# Patient Record
Sex: Female | Born: 1940 | Race: White | Hispanic: No | Marital: Married | Smoking: Never smoker
Health system: Southern US, Community
[De-identification: ages and names within clinical notes are randomized; demographics above are authoritative.]

## PROBLEM LIST (undated history)

## (undated) DIAGNOSIS — Z8742 Personal history of other diseases of the female genital tract: Secondary | ICD-10-CM

## (undated) DIAGNOSIS — R112 Nausea with vomiting, unspecified: Secondary | ICD-10-CM

## (undated) DIAGNOSIS — J45909 Unspecified asthma, uncomplicated: Secondary | ICD-10-CM

## (undated) DIAGNOSIS — I10 Essential (primary) hypertension: Secondary | ICD-10-CM

## (undated) DIAGNOSIS — E288 Other ovarian dysfunction: Secondary | ICD-10-CM

## (undated) DIAGNOSIS — E2839 Other primary ovarian failure: Secondary | ICD-10-CM

## (undated) DIAGNOSIS — K529 Noninfective gastroenteritis and colitis, unspecified: Secondary | ICD-10-CM

## (undated) DIAGNOSIS — S32019A Unspecified fracture of first lumbar vertebra, initial encounter for closed fracture: Secondary | ICD-10-CM

## (undated) DIAGNOSIS — M810 Age-related osteoporosis without current pathological fracture: Secondary | ICD-10-CM

## (undated) DIAGNOSIS — E7800 Pure hypercholesterolemia, unspecified: Secondary | ICD-10-CM

## (undated) DIAGNOSIS — N952 Postmenopausal atrophic vaginitis: Secondary | ICD-10-CM

## (undated) DIAGNOSIS — Z9889 Other specified postprocedural states: Secondary | ICD-10-CM

## (undated) DIAGNOSIS — E2740 Unspecified adrenocortical insufficiency: Secondary | ICD-10-CM

## (undated) DIAGNOSIS — D649 Anemia, unspecified: Secondary | ICD-10-CM

## (undated) HISTORY — DX: Age-related osteoporosis without current pathological fracture: M81.0

## (undated) HISTORY — DX: Anemia, unspecified: D64.9

## (undated) HISTORY — DX: Other primary ovarian failure: E28.39

## (undated) HISTORY — DX: Pure hypercholesterolemia, unspecified: E78.00

## (undated) HISTORY — DX: Unspecified fracture of first lumbar vertebra, initial encounter for closed fracture: S32.019A

## (undated) HISTORY — DX: Noninfective gastroenteritis and colitis, unspecified: K52.9

## (undated) HISTORY — DX: Unspecified asthma, uncomplicated: J45.909

## (undated) HISTORY — DX: Postmenopausal atrophic vaginitis: N95.2

## (undated) HISTORY — DX: Other ovarian dysfunction: E28.8

## (undated) HISTORY — DX: Personal history of other diseases of the female genital tract: Z87.42

## (undated) HISTORY — PX: NASAL SINUS SURGERY: SHX719

---

## 1980-05-07 HISTORY — PX: TUBAL LIGATION: SHX77

## 1994-05-07 HISTORY — PX: HYSTEROSCOPY: SHX211

## 1997-11-25 ENCOUNTER — Ambulatory Visit (HOSPITAL_COMMUNITY): Admission: RE | Admit: 1997-11-25 | Discharge: 1997-11-25 | Payer: Self-pay | Admitting: Obstetrics and Gynecology

## 1998-11-30 ENCOUNTER — Encounter: Payer: Self-pay | Admitting: Obstetrics and Gynecology

## 1998-11-30 ENCOUNTER — Ambulatory Visit (HOSPITAL_COMMUNITY): Admission: RE | Admit: 1998-11-30 | Discharge: 1998-11-30 | Payer: Self-pay | Admitting: Obstetrics and Gynecology

## 1999-04-06 ENCOUNTER — Other Ambulatory Visit: Admission: RE | Admit: 1999-04-06 | Discharge: 1999-04-06 | Payer: Self-pay | Admitting: Obstetrics and Gynecology

## 1999-12-06 ENCOUNTER — Ambulatory Visit (HOSPITAL_COMMUNITY): Admission: RE | Admit: 1999-12-06 | Discharge: 1999-12-06 | Payer: Self-pay | Admitting: Obstetrics and Gynecology

## 1999-12-06 ENCOUNTER — Encounter: Payer: Self-pay | Admitting: Obstetrics and Gynecology

## 2000-02-29 ENCOUNTER — Other Ambulatory Visit: Admission: RE | Admit: 2000-02-29 | Discharge: 2000-02-29 | Payer: Self-pay | Admitting: Obstetrics and Gynecology

## 2000-11-27 ENCOUNTER — Encounter: Payer: Self-pay | Admitting: Obstetrics and Gynecology

## 2000-11-27 ENCOUNTER — Encounter: Admission: RE | Admit: 2000-11-27 | Discharge: 2000-11-27 | Payer: Self-pay | Admitting: Obstetrics and Gynecology

## 2000-12-10 ENCOUNTER — Encounter: Payer: Self-pay | Admitting: Obstetrics and Gynecology

## 2000-12-10 ENCOUNTER — Ambulatory Visit (HOSPITAL_COMMUNITY): Admission: RE | Admit: 2000-12-10 | Discharge: 2000-12-10 | Payer: Self-pay | Admitting: Obstetrics and Gynecology

## 2001-03-28 ENCOUNTER — Other Ambulatory Visit: Admission: RE | Admit: 2001-03-28 | Discharge: 2001-03-28 | Payer: Self-pay | Admitting: Obstetrics and Gynecology

## 2001-12-31 ENCOUNTER — Encounter: Payer: Self-pay | Admitting: Obstetrics and Gynecology

## 2001-12-31 ENCOUNTER — Ambulatory Visit (HOSPITAL_COMMUNITY): Admission: RE | Admit: 2001-12-31 | Discharge: 2001-12-31 | Payer: Self-pay | Admitting: Obstetrics and Gynecology

## 2002-03-11 ENCOUNTER — Other Ambulatory Visit: Admission: RE | Admit: 2002-03-11 | Discharge: 2002-03-11 | Payer: Self-pay | Admitting: Obstetrics and Gynecology

## 2002-08-26 ENCOUNTER — Ambulatory Visit (HOSPITAL_COMMUNITY): Admission: RE | Admit: 2002-08-26 | Discharge: 2002-08-26 | Payer: Self-pay | Admitting: Gastroenterology

## 2003-02-10 ENCOUNTER — Ambulatory Visit (HOSPITAL_COMMUNITY): Admission: RE | Admit: 2003-02-10 | Discharge: 2003-02-10 | Payer: Self-pay | Admitting: Obstetrics and Gynecology

## 2003-02-10 ENCOUNTER — Encounter: Payer: Self-pay | Admitting: Obstetrics and Gynecology

## 2003-03-23 ENCOUNTER — Other Ambulatory Visit: Admission: RE | Admit: 2003-03-23 | Discharge: 2003-03-23 | Payer: Self-pay | Admitting: Obstetrics and Gynecology

## 2003-06-18 ENCOUNTER — Encounter: Admission: RE | Admit: 2003-06-18 | Discharge: 2003-06-18 | Payer: Self-pay | Admitting: Obstetrics and Gynecology

## 2004-04-11 ENCOUNTER — Other Ambulatory Visit: Admission: RE | Admit: 2004-04-11 | Discharge: 2004-04-11 | Payer: Self-pay | Admitting: Obstetrics and Gynecology

## 2004-05-17 ENCOUNTER — Ambulatory Visit (HOSPITAL_COMMUNITY): Admission: RE | Admit: 2004-05-17 | Discharge: 2004-05-17 | Payer: Self-pay | Admitting: Allergy

## 2004-05-24 ENCOUNTER — Ambulatory Visit (HOSPITAL_COMMUNITY): Admission: RE | Admit: 2004-05-24 | Discharge: 2004-05-24 | Payer: Self-pay | Admitting: Obstetrics and Gynecology

## 2005-06-08 ENCOUNTER — Other Ambulatory Visit: Admission: RE | Admit: 2005-06-08 | Discharge: 2005-06-08 | Payer: Self-pay | Admitting: Obstetrics & Gynecology

## 2005-07-06 ENCOUNTER — Ambulatory Visit (HOSPITAL_COMMUNITY): Admission: RE | Admit: 2005-07-06 | Discharge: 2005-07-06 | Payer: Self-pay | Admitting: Obstetrics & Gynecology

## 2005-07-27 ENCOUNTER — Ambulatory Visit (HOSPITAL_COMMUNITY): Admission: RE | Admit: 2005-07-27 | Discharge: 2005-07-27 | Payer: Self-pay | Admitting: Obstetrics & Gynecology

## 2006-01-13 ENCOUNTER — Emergency Department (HOSPITAL_COMMUNITY): Admission: EM | Admit: 2006-01-13 | Discharge: 2006-01-13 | Payer: Self-pay | Admitting: Emergency Medicine

## 2006-07-08 ENCOUNTER — Other Ambulatory Visit: Admission: RE | Admit: 2006-07-08 | Discharge: 2006-07-08 | Payer: Self-pay | Admitting: Obstetrics & Gynecology

## 2006-08-22 ENCOUNTER — Ambulatory Visit (HOSPITAL_COMMUNITY): Admission: RE | Admit: 2006-08-22 | Discharge: 2006-08-22 | Payer: Self-pay | Admitting: Obstetrics & Gynecology

## 2007-07-10 ENCOUNTER — Other Ambulatory Visit: Admission: RE | Admit: 2007-07-10 | Discharge: 2007-07-10 | Payer: Self-pay | Admitting: Obstetrics and Gynecology

## 2007-09-02 ENCOUNTER — Ambulatory Visit (HOSPITAL_COMMUNITY): Admission: RE | Admit: 2007-09-02 | Discharge: 2007-09-02 | Payer: Self-pay | Admitting: Obstetrics & Gynecology

## 2008-09-09 ENCOUNTER — Ambulatory Visit (HOSPITAL_COMMUNITY): Admission: RE | Admit: 2008-09-09 | Discharge: 2008-09-09 | Payer: Self-pay | Admitting: Obstetrics & Gynecology

## 2009-09-15 ENCOUNTER — Ambulatory Visit (HOSPITAL_COMMUNITY): Admission: RE | Admit: 2009-09-15 | Discharge: 2009-09-15 | Payer: Self-pay | Admitting: Obstetrics and Gynecology

## 2010-08-30 ENCOUNTER — Other Ambulatory Visit: Payer: Self-pay | Admitting: Nurse Practitioner

## 2010-08-30 DIAGNOSIS — Z1231 Encounter for screening mammogram for malignant neoplasm of breast: Secondary | ICD-10-CM

## 2010-08-30 DIAGNOSIS — Q782 Osteopetrosis: Secondary | ICD-10-CM

## 2010-09-19 ENCOUNTER — Ambulatory Visit (HOSPITAL_COMMUNITY)
Admission: RE | Admit: 2010-09-19 | Discharge: 2010-09-19 | Disposition: A | Discharge status: Home / Self Care | Payer: Medicare Other | Source: Ambulatory Visit | Attending: Nurse Practitioner | Admitting: Nurse Practitioner

## 2010-09-19 DIAGNOSIS — Z1231 Encounter for screening mammogram for malignant neoplasm of breast: Secondary | ICD-10-CM

## 2010-09-19 DIAGNOSIS — Q782 Osteopetrosis: Secondary | ICD-10-CM

## 2010-09-22 NOTE — Op Note (Signed)
NAME:  Megan Logan, Megan Logan                         ACCOUNT NO.:  [id number]   MEDICAL RECORD NO.:  [id number]                   PATIENT TYPE:  AMB   LOCATION:  ENDO                                 FACILITY:  [facility]   PHYSICIAN:  Anselmo Rod, M.D.               DATE OF BIRTH:  10/08/1940   DATE OF PROCEDURE:  08/26/2002  DATE OF DISCHARGE:                                 OPERATIVE REPORT   PROCEDURE PERFORMED:  Colonoscopy.   ENDOSCOPIST:  Charna Elizabeth, M.D.   INSTRUMENT USED:  Adjustable pediatric Olympus video colonoscope.   INDICATIONS FOR PROCEDURE:  The patient is a 70 year old white female  undergoing screening colonoscopy.  The patient has a family history of colon  cancer in a maternal aunt and was found to have guaiac positive stool in the  office.   PREPROCEDURE PREPARATION:  Informed consent was procured from the patient.  The patient was fasted for eight hours prior to the procedure and prepped  with a bottle of magnesium citrate and a gallon of GoLYTELY the night prior  to the procedure.   PREPROCEDURE PHYSICAL:  The patient had stable vital signs.  Neck supple.  Chest clear to auscultation.  S1 and S2 regular.  Abdomen soft with normal  bowel sounds.   DESCRIPTION OF PROCEDURE:  The patient was placed in left lateral decubitus  position and sedated with 50 mg of Demerol and 5 mg of Versed intravenously.  Once the patient was adequately sedated and maintained on low flow oxygen  and continuous cardiac monitoring, the Olympus video colonoscope was  advanced from the rectum to the cecum and terminal ileum without difficulty.  The appendicular orifice and ileocecal valve were clearly visualized and  photographed.  No masses, polyps, erosions, ulcerations or diverticula were  seen.  There was no evidence of arteriovenous malformations.  Small  nonbleeding internal hemorrhoids were seen on retroflexion in the rectum.   IMPRESSION:  Normal colonoscopy except for small  internal hemorrhoids.   RECOMMENDATIONS:  1. Outpatient follow-up is advised in the next two weeks for repeat guaiac     testing.  2. Repeat colorectal cancer screening recommended in the next five years     unless the patient develops any abdominal symptoms in the interim.  3. High fiber diet with liberal fluid intake.                                                Anselmo Rod, M.D.    JNM/MEDQ  D:  08/26/2002  T:  08/26/2002  Job:  [id number]   cc:   Laqueta Linden, M.D.  [address], [address]  [place]  [place] [address]  Fax: [phone]   Teena Irani. Arlyce Dice, M.D.  [address]  [place]  [place] [address]  Fax: [phone]

## 2011-09-27 ENCOUNTER — Other Ambulatory Visit: Payer: Self-pay | Admitting: Obstetrics & Gynecology

## 2011-09-27 DIAGNOSIS — Z1231 Encounter for screening mammogram for malignant neoplasm of breast: Secondary | ICD-10-CM

## 2011-10-30 ENCOUNTER — Ambulatory Visit (HOSPITAL_COMMUNITY)
Admission: RE | Admit: 2011-10-30 | Discharge: 2011-10-30 | Disposition: A | Discharge status: Home / Self Care | Payer: Medicare Other | Source: Ambulatory Visit | Attending: Obstetrics & Gynecology | Admitting: Obstetrics & Gynecology

## 2011-10-30 DIAGNOSIS — Z1231 Encounter for screening mammogram for malignant neoplasm of breast: Secondary | ICD-10-CM | POA: Insufficient documentation

## 2012-06-23 ENCOUNTER — Encounter: Payer: Self-pay | Admitting: Gynecology

## 2012-08-04 ENCOUNTER — Ambulatory Visit (INDEPENDENT_AMBULATORY_CARE_PROVIDER_SITE_OTHER): Payer: Medicare Other | Admitting: Nurse Practitioner

## 2012-08-04 ENCOUNTER — Encounter: Payer: Self-pay | Admitting: Nurse Practitioner

## 2012-08-04 VITALS — BP 102/70 | HR 64 | Resp 16 | Ht 61.5 in | Wt 114.0 lb

## 2012-08-04 DIAGNOSIS — N952 Postmenopausal atrophic vaginitis: Secondary | ICD-10-CM | POA: Insufficient documentation

## 2012-08-04 DIAGNOSIS — Z0000 Encounter for general adult medical examination without abnormal findings: Secondary | ICD-10-CM

## 2012-08-04 DIAGNOSIS — M810 Age-related osteoporosis without current pathological fracture: Secondary | ICD-10-CM

## 2012-08-04 DIAGNOSIS — Z01419 Encounter for gynecological examination (general) (routine) without abnormal findings: Secondary | ICD-10-CM

## 2012-08-04 DIAGNOSIS — E559 Vitamin D deficiency, unspecified: Secondary | ICD-10-CM

## 2012-08-04 LAB — POCT URINALYSIS DIPSTICK
Bilirubin, UA: NEGATIVE
Glucose, UA: NEGATIVE
Leukocytes, UA: NEGATIVE
Nitrite, UA: NEGATIVE

## 2012-08-04 LAB — HEMOGLOBIN, FINGERSTICK: Hemoglobin, fingerstick: 13 g/dL (ref 12.0–16.0)

## 2012-08-04 MED ORDER — ESTRADIOL 2 MG VA RING: 2.0000 mg | VAGINAL_RING | VAGINAL | Status: DC

## 2012-08-04 MED ORDER — VITAMIN D (ERGOCALCIFEROL) 1.25 MG (50000 UNIT) PO CAPS: 50000.0000 [IU] | ORAL_CAPSULE | ORAL | Status: DC

## 2012-08-04 NOTE — Progress Notes (Signed)
72 y.o. MarriedCaucasian female   G1P0 here for annual exam.  No further vaginal discharge, itching or discharge.Vaginal dryness is better with estring  No LMP recorded. Patient is postmenopausal.          Sexually active: yes  The current method of family planning is post menopausal status.    Exercising: yes  Home exercise routine includes stretching. Last mammogram: 6/13 Last pap: 07/18/09 Last BMD: 09/2010 Last colonoscopy: 12/2008, recheck 5 yrs. Alcohol: no Tobacco: no   Health Maintenance  Topic Date Due  . Influenza Vaccine  01/06/1943  . Tetanus/tdap  06/03/1961  . Colonoscopy  06/03/1992  . Zostavax  06/03/2002  . Pneumococcal Polysaccharide Vaccine Age 57 And Over  06/04/2007    Family History  Problem Relation Age of Onset  . Cancer - Colon Maternal Grandfather   . Prostate cancer Father     There is no problem list on file for this patient.   Past Medical History  Diagnosis Date  . Osteopenia   . Asthma   . Hypercholesterolemia   . Anemia     diet related  . Atrophic vaginitis   . Osteoporosis     spine  . Hx of endometriosis     took Danacrine    Past Surgical History  Procedure Laterality Date  . Tubal ligation Bilateral 1982  . Hysteroscopy  1996    DUB--wnl    Allergies: Levofloxacin; Penicillins; Prednisone; and Augmentin  Current Outpatient Prescriptions  Medication Sig Dispense Refill  . antiseptic oral rinse (BIOTENE) LIQD 15 mLs by Mouth Rinse route as needed.      Marland Kitchen azelastine (ASTEPRO) 137 MCG/SPRAY nasal spray Place 1 spray into the nose 2 (two) times daily. Use in each nostril as directed      . budesonide-formoterol (SYMBICORT) 160-4.5 MCG/ACT inhaler Inhale 2 puffs into the lungs 2 (two) times daily.      . calcium carbonate (OS-CAL) 600 MG TABS Take 600 mg by mouth 2 (two) times daily with a meal.      . ciclesonide (OMNARIS) 50 MCG/ACT nasal spray Place 2 sprays into both nostrils daily.      Marland Kitchen co-enzyme Q-10 30 MG capsule Take  30 mg by mouth 3 (three) times daily.      Marland Kitchen estradiol (ESTRING) 2 MG vaginal ring Place 2 mg vaginally every 3 (three) months. follow package directions      . fexofenadine (ALLEGRA) 180 MG tablet Take 180 mg by mouth daily.      Marland Kitchen glucosamine-chondroitin 500-400 MG tablet Take 1 tablet by mouth 3 (three) times daily.      Marland Kitchen levalbuterol (XOPENEX) 0.31 MG/3ML nebulizer solution Take 1 ampule by nebulization every 4 (four) hours as needed for wheezing.      Marland Kitchen LYSINE PO Take by mouth.      . montelukast (SINGULAIR) 10 MG tablet Take 10 mg by mouth at bedtime.      . Multiple Vitamin (MULTIVITAMIN) tablet Take 1 tablet by mouth daily.      Marland Kitchen omeprazole (PRILOSEC) 20 MG capsule Take 20 mg by mouth daily.      . pramipexole (MIRAPEX) 0.5 MG tablet Take 0.5 mg by mouth 3 (three) times daily.      Marland Kitchen Specialty Vitamins Products (MAGNESIUM, AMINO ACID CHELATE,) 133 MG tablet Take 1 tablet by mouth 2 (two) times daily.      . Vitamin D, Ergocalciferol, (DRISDOL) 50000 UNITS CAPS Take 50,000 Units by mouth every 14 (fourteen) days.      Marland Kitchen  atorvastatin (LIPITOR) 20 MG tablet Take 20 mg by mouth daily.      Marland Kitchen docusate sodium (COLACE) 100 MG capsule Take 100 mg by mouth 2 (two) times daily.       No current facility-administered medications for this visit.    ROS: A comprehensive review of systems was negative.  Exam:    BP 102/70  Pulse 64  Resp 16  Ht 5' 1.5" (1.562 m)  Wt 114 lb (51.71 kg)  BMI 21.19 kg/m2 Weight change: @WEIGHTCHANGE @ Last 3 height recordings:  Ht Readings from Last 3 Encounters:  08/04/12 5' 1.5" (1.562 m)   General appearance: alert, cooperative, appears stated age and no distress Head: Normocephalic, without obvious abnormality Neck: no adenopathy, supple, symmetrical, trachea midline and thyroid not enlarged, symmetric, no tenderness/mass/nodules Lungs: clear to auscultation bilaterally Breasts: normal appearance, no masses or tenderness Heart: regular rate and rhythm,  S1, S2 normal, no murmur, click, rub or gallop Abdomen: normal findings: no masses palpable, no scars, striae, dilated veins, rashes, or lesions and soft, non-tender Extremities: extremities normal, atraumatic, no cyanosis or edema Skin: Skin color, texture, turgor normal. No rashes or lesions Lymph nodes: Cervical, supraclavicular, and axillary nodes normal. no inguinal nodes palpated Neurologic: Grossly normal   Pelvic: External genitalia:  no lesions and atrophic appearance              Urethra: normal appearing urethra with no masses, tenderness or lesions              Bartholins and Skenes: normal                 Vagina: atrophic              Cervix: normal appearance              Pap taken: no        Bimanual Exam:  Uterus:  uterus is normal size, shape, consistency and nontender                                      Adnexa:    normal adnexa in size, nontender and no masses                                      Rectovaginal: Confirms                                      Anus:  normal sphincter tone, no lesions  A:  GYN well woman exam  Atrophic Vaginitis  Osteoporosis - "drug holiday" from Actonel X 2 years  no contraindication to continue hormonal therapy with  Estring Vaginal Ring      P: counseled on breast self exam, mammography screening,  use and side effects of ERT with Estring, osteoporosis,  diet and exercise including upper body weights and return  annually or prn.  Will order a repeat BMD for comparison to  check stability.    Next pap or exam due in 1 year Next Mammogram due:  10/2012 Examine your breast Monthly Take a Women's multivitamin Take 1200 mg. of calcium daily - prefer dietary If any concerns in interim to call back  An After Visit Summary was printed and given to the patient.

## 2012-08-04 NOTE — Patient Instructions (Addendum)
Next pap due in 1 year Next Mammogram due: 09/2012 Bone Densitiy: 10/2012 Examine your breast Monthly Take a Women's multivitamin Take 1200 mg. of calcium daily - prefer dietary If any concerns in interim to call back    Osteoporosis Throughout your life, your body breaks down old bone and replaces it with new bone. As you get older, your body does not replace bone as quickly as it breaks it down. By the age of 30 years, most people begin to gradually lose bone because of the imbalance between bone loss and replacement. Some people lose more bone than others. Bone loss beyond a specified normal degree is considered osteoporosis.  Osteoporosis affects the strength and durability of your bones. The inside of the ends of your bones and your flat bones, like the bones of your pelvis, look like honeycomb, filled with tiny open spaces. As bone loss occurs, your bones become less dense. This means that the open spaces inside your bones become bigger and the walls between these spaces become thinner. This makes your bones weaker. Bones of a person with osteoporosis can become so weak that they can break (fracture) during minor accidents, such as a simple fall. CAUSES  The following factors have been associated with the development of osteoporosis:  Smoking.  Drinking more than 2 alcoholic drinks several days per week.  Long-term use of certain medicines:  Corticosteroids.  Chemotherapy medicines.  Thyroid medicines.  Antiepileptic medicines.  Gonadal hormone suppression medicine.  Immunosuppression medicine.  Being underweight.  Lack of physical activity.  Lack of exposure to the sun. This can lead to vitamin D deficiency.  Certain medical conditions:  Certain inflammatory bowel diseases, such as Crohn's disease and ulcerative colitis.  Diabetes.  Hyperthyroidism.  Hyperparathyroidism. RISK FACTORS Anyone can develop osteoporosis. However, the following factors can increase  your risk of developing osteoporosis:  Gender Women are at higher risk than men.  Age Being older than 50 years increases your risk.  Ethnicity White and Asian people have an increased risk.  Weight Being extremely underweight can increase your risk of osteoporosis.  Family history of osteoporosis Having a family member who has developed osteoporosis can increase your risk. SYMPTOMS  Usually, people with osteoporosis have no symptoms.  DIAGNOSIS  Signs during a physical exam that may prompt your caregiver to suspect osteoporosis include:  Decreased height. This is usually caused by the compression of the bones that form your spine (vertebrae) because they have weakened and become fractured.  A curving or rounding of the upper back (kyphosis). To confirm signs of osteoporosis, your caregiver may request a procedure that uses 2 low-dose X-ray beams with different levels of energy to measure your bone mineral density (dual-energy X-ray absorptiometry [DXA]). Also, your caregiver may check your level of vitamin D. TREATMENT  The goal of osteoporosis treatment is to strengthen bones in order to decrease the risk of bone fractures. There are different types of medicines available to help achieve this goal. Some of these medicines work by slowing the processes of bone loss. Some medicines work by increasing bone density. Treatment also involves making sure that your levels of calcium and vitamin D are adequate. PREVENTION  There are things you can do to help prevent osteoporosis. Adequate intake of calcium and vitamin D can help you achieve optimal bone mineral density. Regular exercise can also help, especially resistance and high-impact activities. If you smoke, quitting smoking is an important part of osteoporosis prevention. MAKE SURE YOU:  Understand these instructions.  Will watch your condition.  Will get help right away if you are not doing well or get worse. Document Released:  01/31/2005 Document Revised: 07/16/2011 Document Reviewed: 04/07/2011 [facility] Patient Information 2013 [place], [place].

## 2012-08-07 LAB — VITAMIN D 25 HYDROXY (VIT D DEFICIENCY, FRACTURES): Vit D, 25-Hydroxy: 64 ng/mL (ref 30–89)

## 2012-08-08 ENCOUNTER — Telehealth: Payer: Self-pay | Admitting: Nurse Practitioner

## 2012-08-08 NOTE — Telephone Encounter (Signed)
Spoke with pt to give results from last visit. Instructed Vit D was 64. Per protocol, she can just take an OTC dose of Vit D 600-800 IU daily. Instructed her HGB was 13, and her urinalysis was normal. Pt appreciative.  aa

## 2012-08-08 NOTE — Telephone Encounter (Signed)
Pt says she's returning a call regarding results.

## 2012-08-11 ENCOUNTER — Telehealth: Payer: Self-pay | Admitting: *Deleted

## 2012-08-11 NOTE — Telephone Encounter (Signed)
PATIENT NOTIFIED OF LAB RESULTS FOR VITAMIN D LEVEL AND REQUIRES NO MEDICATION FOR VITAMIN D AT THIS TIME. PATIENT REQUEST TO GO AHEAD AND SCHEDULE HER MAMMOGRAM AND BONE DENSITY NOW . IT IS DUE IN LATE June/2014. PUT ORDER IN AS REFERRAL. THANKS / SUE

## 2012-11-10 ENCOUNTER — Other Ambulatory Visit: Payer: Self-pay | Admitting: Obstetrics & Gynecology

## 2012-11-10 DIAGNOSIS — Z1231 Encounter for screening mammogram for malignant neoplasm of breast: Secondary | ICD-10-CM

## 2012-12-02 ENCOUNTER — Ambulatory Visit (HOSPITAL_COMMUNITY): Payer: Medicare Other

## 2012-12-03 ENCOUNTER — Other Ambulatory Visit: Payer: Self-pay | Admitting: Nurse Practitioner

## 2012-12-03 NOTE — Telephone Encounter (Signed)
eScribe request for refill on ESTRING  Last filled - 3//31/14 X 1 YEAR  Last AEX - 08/04/12 Per pharmacy, they do not have any refills in profile since 2012.  RX sent.

## 2012-12-03 NOTE — Telephone Encounter (Signed)
eScribe request for refill on ESTRING Last filled - 3//31/14 X 1 YEAR Last AEX - 08/04/12 Pt was given one year supply at [facility] on 08/04/12.  RX denied.

## 2012-12-16 ENCOUNTER — Ambulatory Visit (HOSPITAL_COMMUNITY): Payer: Medicare Other

## 2012-12-23 ENCOUNTER — Ambulatory Visit (HOSPITAL_COMMUNITY): Payer: Medicare Other

## 2013-03-10 ENCOUNTER — Ambulatory Visit (HOSPITAL_COMMUNITY)
Admission: RE | Admit: 2013-03-10 | Discharge: 2013-03-10 | Disposition: A | Discharge status: Home / Self Care | Payer: Medicare Other | Source: Ambulatory Visit | Attending: Nurse Practitioner | Admitting: Nurse Practitioner

## 2013-03-10 ENCOUNTER — Ambulatory Visit (HOSPITAL_COMMUNITY)
Admission: RE | Admit: 2013-03-10 | Discharge: 2013-03-10 | Disposition: A | Discharge status: Home / Self Care | Payer: Medicare Other | Source: Ambulatory Visit | Attending: Obstetrics & Gynecology | Admitting: Obstetrics & Gynecology

## 2013-03-10 DIAGNOSIS — Z1231 Encounter for screening mammogram for malignant neoplasm of breast: Secondary | ICD-10-CM

## 2013-03-10 DIAGNOSIS — Z13820 Encounter for screening for osteoporosis: Secondary | ICD-10-CM | POA: Insufficient documentation

## 2013-03-10 DIAGNOSIS — M810 Age-related osteoporosis without current pathological fracture: Secondary | ICD-10-CM

## 2013-03-10 DIAGNOSIS — Z780 Asymptomatic menopausal state: Secondary | ICD-10-CM | POA: Insufficient documentation

## 2013-08-06 ENCOUNTER — Ambulatory Visit (INDEPENDENT_AMBULATORY_CARE_PROVIDER_SITE_OTHER): Payer: Medicare Other | Admitting: Nurse Practitioner

## 2013-08-06 ENCOUNTER — Encounter: Payer: Self-pay | Admitting: Nurse Practitioner

## 2013-08-06 VITALS — BP 110/66 | HR 60 | Ht 61.25 in | Wt 110.0 lb

## 2013-08-06 DIAGNOSIS — E559 Vitamin D deficiency, unspecified: Secondary | ICD-10-CM | POA: Insufficient documentation

## 2013-08-06 DIAGNOSIS — Z0000 Encounter for general adult medical examination without abnormal findings: Secondary | ICD-10-CM

## 2013-08-06 DIAGNOSIS — M810 Age-related osteoporosis without current pathological fracture: Secondary | ICD-10-CM

## 2013-08-06 DIAGNOSIS — E2839 Other primary ovarian failure: Secondary | ICD-10-CM | POA: Insufficient documentation

## 2013-08-06 DIAGNOSIS — Z01419 Encounter for gynecological examination (general) (routine) without abnormal findings: Secondary | ICD-10-CM

## 2013-08-06 DIAGNOSIS — E288 Other ovarian dysfunction: Secondary | ICD-10-CM

## 2013-08-06 DIAGNOSIS — Z124 Encounter for screening for malignant neoplasm of cervix: Secondary | ICD-10-CM

## 2013-08-06 DIAGNOSIS — N952 Postmenopausal atrophic vaginitis: Secondary | ICD-10-CM

## 2013-08-06 LAB — HEMOGLOBIN, FINGERSTICK: Hemoglobin, fingerstick: 12.4 g/dL (ref 12.0–16.0)

## 2013-08-06 MED ORDER — ESTRADIOL 2 MG VA RING: VAGINAL_RING | VAGINAL | Status: DC

## 2013-08-06 NOTE — Progress Notes (Signed)
Patient ID: Megan Logan, female   DOB: 09/13/1940, 73 y.o.   MRN: [id number] 73 y.o. G1P0 Married Caucasian Fe here for annual exam.  She has done well this past year other than a bad flare of colitis.  She had diarrhea for about 12 weeks. finally treated with antibiotics and all has been good since then.  Still likes Estring and doing well with that.  Leaving today for [place] for Easter.  Patient's last menstrual period was 05/07/1982.          Sexually active: yes  The current method of family planning is post menopausal status.    Exercising: yes  Walking and weight machine Smoker:  no  Health Maintenance: Pap:  07/14/09, WNL MMG:  03/10/13, Bi-Rads 1: negative Colonoscopy:  12/2008, repeat in 5 years BMD:  03/10/13, Osteoporosis TDaP:  2008 Labs:  HB:  12.4 Urine:  Negative    reports that she has never smoked. She has never used smokeless tobacco. She reports that she does not drink alcohol or use illicit drugs.  Past Medical History  Diagnosis Date  . Asthma   . Hypercholesterolemia   . Anemia     diet related  . Atrophic vaginitis   . Hx of endometriosis     took Danacrine  . Osteoporosis     spine off Actonel sinsce 09/2010- Drug Holiday  . Colitis   . Premature ovarian failure age 53    HRT age 39 - 12/2000    Past Surgical History  Procedure Laterality Date  . Tubal ligation Bilateral 1982  . Hysteroscopy  1996    DUB--wnl  . Nasal sinus surgery  40's and 50's    Current Outpatient Prescriptions  Medication Sig Dispense Refill  . atorvastatin (LIPITOR) 10 MG tablet Take 10 mg by mouth daily.      Marland Kitchen BIOTIN 5000 PO Take by mouth.      . budesonide-formoterol (SYMBICORT) 160-4.5 MCG/ACT inhaler Inhale 2 puffs into the lungs 2 (two) times daily.      . calcium carbonate (OS-CAL) 600 MG TABS Take 600 mg by mouth 2 (two) times daily with a meal.      . cetirizine (ZYRTEC) 10 MG tablet Take 10 mg by mouth daily.      . Cholecalciferol (VITAMIN D) 2000 UNITS  CAPS Take 1 capsule by mouth once a week.      . ciclesonide (OMNARIS) 50 MCG/ACT nasal spray Place 2 sprays into both nostrils daily.      Marland Kitchen co-enzyme Q-10 30 MG capsule Take 30 mg by mouth 3 (three) times daily.      Marland Kitchen docusate sodium (COLACE) 100 MG capsule Take 100 mg by mouth 2 (two) times daily.      Marland Kitchen EPIPEN 2-PAK 0.3 MG/0.3ML DEVI       . estradiol (ESTRING) 2 MG vaginal ring USE EVERY 3 MONTHS AS DIRECTED  1 each  3  . folic acid (FOLVITE) 400 MCG tablet Take 400 mcg by mouth daily.      Marland Kitchen glucosamine-chondroitin 500-400 MG tablet Take 1 tablet by mouth 3 (three) times daily.      Marland Kitchen levalbuterol (XOPENEX) 0.31 MG/3ML nebulizer solution Take 1 ampule by nebulization every 4 (four) hours as needed for wheezing.      . Lutein 6 MG CAPS Take 1 capsule by mouth daily.      Marland Kitchen LYSINE PO Take by mouth.      . meloxicam (MOBIC) 15 MG tablet       .  montelukast (SINGULAIR) 10 MG tablet Take 10 mg by mouth at bedtime.      . Multiple Vitamin (MULTIVITAMIN) tablet Take 1 tablet by mouth daily.      Marland Kitchen omeprazole (PRILOSEC) 20 MG capsule Take 20 mg by mouth daily.      . polyethylene glycol powder (GLYCOLAX/MIRALAX) powder       . pramipexole (MIRAPEX) 0.5 MG tablet Take 0.5 mg by mouth 3 (three) times daily.      Marland Kitchen pyridOXINE (VITAMIN B-6) 100 MG tablet Take 100 mg by mouth daily.      Marland Kitchen rOPINIRole (REQUIP) 0.5 MG tablet       . Specialty Vitamins Products (MAGNESIUM, AMINO ACID CHELATE,) 133 MG tablet Take 1 tablet by mouth 2 (two) times daily.      Pauline Aus HFA 45 MCG/ACT inhaler       . antiseptic oral rinse (BIOTENE) LIQD 15 mLs by Mouth Rinse route as needed.      Marland Kitchen azelastine (ASTEPRO) 137 MCG/SPRAY nasal spray Place 1 spray into the nose 2 (two) times daily. Use in each nostril as directed      . chlorhexidine (PERIDEX) 0.12 % solution        No current facility-administered medications for this visit.    Family History  Problem Relation Age of Onset  . Cancer - Colon Maternal  Grandfather 51  . Prostate cancer Father 43  . Alzheimer's disease Mother 23  . Other Sister     ulcerative colitis, dizziness, migraine    ROS:  Pertinent items are noted in HPI.  Otherwise, a comprehensive ROS was negative.  Exam:   BP 110/66  Pulse 60  Ht 5' 1.25" (1.556 m)  Wt 110 lb (49.896 kg)  BMI 20.61 kg/m2  LMP 05/07/1982 Height: 5' 1.25" (155.6 cm)  Ht Readings from Last 3 Encounters:  08/06/13 5' 1.25" (1.556 m)  08/04/12 5' 1.5" (1.562 m)    General appearance: alert, cooperative and appears stated age Head: Normocephalic, without obvious abnormality, atraumatic Neck: no adenopathy, supple, symmetrical, trachea midline and thyroid normal to inspection and palpation Lungs: clear to auscultation bilaterally Breasts: normal appearance, no masses or tenderness Heart: regular rate and rhythm Abdomen: soft, non-tender; no masses,  no organomegaly Extremities: extremities normal, atraumatic, no cyanosis or edema Skin: Skin color, texture, turgor normal. No rashes or lesions Lymph nodes: Cervical, supraclavicular, and axillary nodes normal. No abnormal inguinal nodes palpated Neurologic: Grossly normal   Pelvic: External genitalia:  no lesions              Urethra:  normal appearing urethra with no masses, tenderness or lesions              Bartholin's and Skene's: normal                 Vagina: normal appearing vagina with normal color and discharge, no lesions              Cervix: anteverted              Pap taken: no Bimanual Exam:  Uterus:  normal size, contour, position, consistency, mobility, non-tender              Adnexa: no mass, fullness, tenderness               Rectovaginal: Confirms               Anus:  normal sphincter tone, no lesions   A:  Well Woman  with normal exam  Postmenopausal with history of POF at age 73   HRT from age 73 - 12/2000  Atrophic vaginitis and improved with Estring  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 - Drug Holiday since 09/2010. Stable BMD in 2014.  History of Vit D deficiency - on OTC  History of RLS   P:   Pap smear as per guidelines   Mammogram due 03/2014  Refill Estring vaginal ring  Counseled with potential risk of DVT, CVA, cancer, etc  Counseled on breast self exam, mammography screening, use and side effects of HRT, osteoporosis, adequate intake of calcium and vitamin D, diet and exercise return annually or prn  An After Visit Summary was printed and given to the patient.

## 2013-08-06 NOTE — Patient Instructions (Signed)

## 2013-08-07 LAB — VITAMIN D 25 HYDROXY (VIT D DEFICIENCY, FRACTURES): Vit D, 25-Hydroxy: 47 ng/mL (ref 30–89)

## 2013-08-11 NOTE — Progress Notes (Signed)
Encounter reviewed by Dr. Brook Silva.  

## 2014-03-08 ENCOUNTER — Encounter: Payer: Self-pay | Admitting: Nurse Practitioner

## 2014-05-05 ENCOUNTER — Other Ambulatory Visit: Payer: Self-pay | Admitting: Nurse Practitioner

## 2014-05-05 DIAGNOSIS — Z1231 Encounter for screening mammogram for malignant neoplasm of breast: Secondary | ICD-10-CM

## 2014-06-03 ENCOUNTER — Ambulatory Visit (HOSPITAL_COMMUNITY)
Admission: RE | Admit: 2014-06-03 | Discharge: 2014-06-03 | Disposition: A | Discharge status: Home / Self Care | Payer: Medicare Other | Source: Ambulatory Visit | Attending: Nurse Practitioner | Admitting: Nurse Practitioner

## 2014-06-03 DIAGNOSIS — Z1231 Encounter for screening mammogram for malignant neoplasm of breast: Secondary | ICD-10-CM | POA: Insufficient documentation

## 2014-08-09 ENCOUNTER — Ambulatory Visit (INDEPENDENT_AMBULATORY_CARE_PROVIDER_SITE_OTHER): Payer: Medicare Other | Admitting: Nurse Practitioner

## 2014-08-09 ENCOUNTER — Encounter: Payer: Self-pay | Admitting: Nurse Practitioner

## 2014-08-09 VITALS — BP 102/60 | HR 72 | Resp 20 | Ht 61.0 in | Wt 113.0 lb

## 2014-08-09 DIAGNOSIS — Z0000 Encounter for general adult medical examination without abnormal findings: Secondary | ICD-10-CM | POA: Diagnosis not present

## 2014-08-09 DIAGNOSIS — Z01419 Encounter for gynecological examination (general) (routine) without abnormal findings: Secondary | ICD-10-CM | POA: Diagnosis not present

## 2014-08-09 DIAGNOSIS — R8290 Unspecified abnormal findings in urine: Secondary | ICD-10-CM

## 2014-08-09 DIAGNOSIS — Z124 Encounter for screening for malignant neoplasm of cervix: Secondary | ICD-10-CM | POA: Diagnosis not present

## 2014-08-09 LAB — POCT URINALYSIS DIPSTICK
Bilirubin, UA: NEGATIVE
Blood, UA: NEGATIVE
Glucose, UA: NEGATIVE
Ketones, UA: NEGATIVE
Nitrite, UA: NEGATIVE
Protein, UA: NEGATIVE
Urobilinogen, UA: NEGATIVE
pH, UA: 5

## 2014-08-09 MED ORDER — ESTRADIOL 2 MG VA RING: VAGINAL_RING | VAGINAL | Status: DC

## 2014-08-09 NOTE — Progress Notes (Signed)
74 y.o. G1P0 Married  Caucasian Fe here for annual exam.  No current problems with the Estring.  Really likes and wants to continue.  Less dyspareunia and UTI.  She did have a recent flare of colitis.  Followed by GI.  Patient's last menstrual period was 05/07/1982.          Sexually active: Yes.    The current method of family planning is post menopausal status.    Exercising: Yes.    Walking and weights  Smoker:  no  Health Maintenance: Pap: 07/2009 neg MMG:  06/04/14 BIRADS1:neg Self Breast Exam: yes, oc Colonoscopy:  05/2013 Colitis - Every 5 years  BMD:  03/2013 Osteoporosis Drug Holiday from Bisphosphonate's 09/2010 TDaP: 2007 Shingles: 2011 Prevnar: not yet Labs: PCP UA: WBC=Mod   reports that she has never smoked. She has never used smokeless tobacco. She reports that she does not drink alcohol or use illicit drugs.  Past Medical History  Diagnosis Date  . Asthma   . Hypercholesterolemia   . Anemia     diet related  . Atrophic vaginitis   . Hx of endometriosis     took Danacrine  . Osteoporosis     spine off Actonel sinsce 09/2010- Drug Holiday  . Colitis   . Premature ovarian failure age 89    HRT age 44 - 12/2000    Past Surgical History  Procedure Laterality Date  . Tubal ligation Bilateral 1982  . Hysteroscopy  1996    DUB--wnl  . Nasal sinus surgery  40's and 50's    Current Outpatient Prescriptions  Medication Sig Dispense Refill  . antiseptic oral rinse (BIOTENE) LIQD 15 mLs by Mouth Rinse route as needed.    Marland Kitchen atorvastatin (LIPITOR) 10 MG tablet Take 10 mg by mouth daily.    Marland Kitchen azelastine (ASTEPRO) 137 MCG/SPRAY nasal spray Place 1 spray into the nose 2 (two) times daily. Use in each nostril as directed    . BIOTIN 5000 PO Take by mouth.    . budesonide-formoterol (SYMBICORT) 160-4.5 MCG/ACT inhaler Inhale 2 puffs into the lungs 2 (two) times daily.    . calcium carbonate (OS-CAL) 600 MG TABS Take 600 mg by mouth 2 (two) times daily with a meal.    .  cetirizine (ZYRTEC) 10 MG tablet Take 10 mg by mouth daily.    . Cholecalciferol (VITAMIN D) 2000 UNITS CAPS Take 1 capsule by mouth once a week.    . ciclesonide (OMNARIS) 50 MCG/ACT nasal spray Place 2 sprays into both nostrils daily.    Marland Kitchen co-enzyme Q-10 30 MG capsule Take 30 mg by mouth 3 (three) times daily.    Marland Kitchen estradiol (ESTRING) 2 MG vaginal ring USE EVERY 3 MONTHS AS DIRECTED 1 each 3  . folic acid (FOLVITE) 400 MCG tablet Take 400 mcg by mouth daily.    Marland Kitchen glucosamine-chondroitin 500-400 MG tablet Take 1 tablet by mouth 3 (three) times daily.    Marland Kitchen levalbuterol (XOPENEX) 0.31 MG/3ML nebulizer solution Take 1 ampule by nebulization every 4 (four) hours as needed for wheezing.    Marland Kitchen LIALDA 1.2 G EC tablet Take 1.2 g by mouth 4 (four) times daily.  6  . Lutein 6 MG CAPS Take 1 capsule by mouth daily.    Marland Kitchen LYSINE PO Take by mouth.    . montelukast (SINGULAIR) 10 MG tablet Take 10 mg by mouth at bedtime.    . Multiple Vitamin (MULTIVITAMIN) tablet Take 1 tablet by mouth daily.    Marland Kitchen  omeprazole (PRILOSEC) 20 MG capsule Take 20 mg by mouth daily.    . pramipexole (MIRAPEX) 1 MG tablet Take 1 tablet by mouth at bedtime.  5  . pyridOXINE (VITAMIN B-6) 100 MG tablet Take 100 mg by mouth daily.    Marland Kitchen. Specialty Vitamins Products (MAGNESIUM, AMINO ACID CHELATE,) 133 MG tablet Take 1 tablet by mouth 2 (two) times daily.    Pauline Aus. XOPENEX HFA 45 MCG/ACT inhaler     . EPIPEN 2-PAK 0.3 MG/0.3ML DEVI      No current facility-administered medications for this visit.    Family History  Problem Relation Age of Onset  . Cancer - Colon Maternal Grandfather 51  . Prostate cancer Father [id number]  . Alzheimer's disease Mother [id number]  . Other Sister     ulcerative colitis, dizziness, migraine  . Cancer Sister 378    Brain and Lung    ROS:  Pertinent items are noted in HPI.  Otherwise, a comprehensive ROS was negative.  Exam:   BP 102/60 mmHg  Pulse 72  Resp 20  Ht 5\' 1"  (1.549 m)  Wt 113 lb (51.256 kg)  BMI  21.36 kg/m2  LMP 05/07/1982 Height: 5\' 1"  (154.9 cm) Ht Readings from Last 3 Encounters:  08/09/14 5\' 1"  (1.549 m)  08/06/13 5' 1.25" (1.556 m)  08/04/12 5' 1.5" (1.562 m)    General appearance: alert, cooperative and appears stated age Head: Normocephalic, without obvious abnormality, atraumatic Neck: no adenopathy, supple, symmetrical, trachea midline and thyroid normal to inspection and palpation Lungs: clear to auscultation bilaterally Breasts: normal appearance, no masses or tenderness Heart: regular rate and rhythm Abdomen: soft, non-tender; no masses,  no organomegaly Extremities: extremities normal, atraumatic, no cyanosis or edema Skin: Skin color, texture, turgor normal. No rashes or lesions Lymph nodes: Cervical, supraclavicular, and axillary nodes normal. No abnormal inguinal nodes palpated Neurologic: Grossly normal   Pelvic: External genitalia:  no lesions              Urethra:  normal appearing urethra with no masses, tenderness or lesions              Bartholin's and Skene's: normal                 Vagina: normal appearing vagina with normal color and discharge, no lesions, the Estring is removed              Cervix: anteverted              Pap taken: No. Bimanual Exam:  Uterus:  normal size, contour, position, consistency, mobility, non-tender              Adnexa: no mass, fullness, tenderness               Rectovaginal: deferred due to recent flare of colitis               Anus:  normal sphincter tone, no lesions  Chaperone present: NO  A:  Well Woman with normal exam  Atrophic Vaginitis treated with Estring and doing well Osteoporosis - "drug holiday" from Actonel X 2 years  Recent flare of colitis   R/O UTI   P:   Reviewed health and wellness pertinent to exam  Pap smear not taken today  Mammogram is due 05/2015  Refill of Estring for 1 year  Counseled on risk CVA, DVT, cancer, etc  Follow with urine culture and micro -  asymptomatic  Counseled on breast self exam, mammography screening,  use and side effects of HRT, adequate intake of calcium and vitamin D, diet and exercise return annually or prn  An After Visit Summary was printed and given to the patient.

## 2014-08-09 NOTE — Patient Instructions (Addendum)

## 2014-08-10 ENCOUNTER — Ambulatory Visit: Payer: Medicare Other | Admitting: Nurse Practitioner

## 2014-08-10 LAB — URINALYSIS, MICROSCOPIC ONLY
CASTS: NONE SEEN
CRYSTALS: NONE SEEN

## 2014-08-11 LAB — URINE CULTURE
Colony Count: NO GROWTH
Organism ID, Bacteria: NO GROWTH

## 2014-08-12 NOTE — Progress Notes (Signed)
Encounter reviewed by Dr. Brook Silva.  

## 2015-06-29 ENCOUNTER — Ambulatory Visit
Admission: RE | Admit: 2015-06-29 | Discharge: 2015-06-29 | Disposition: A | Discharge status: Home / Self Care | Payer: Medicare Other | Source: Ambulatory Visit | Attending: Allergy | Admitting: Allergy

## 2015-06-29 ENCOUNTER — Other Ambulatory Visit: Payer: Self-pay | Admitting: Allergy

## 2015-06-29 DIAGNOSIS — J069 Acute upper respiratory infection, unspecified: Secondary | ICD-10-CM

## 2015-07-07 DIAGNOSIS — G2581 Restless legs syndrome: Secondary | ICD-10-CM | POA: Insufficient documentation

## 2015-07-07 DIAGNOSIS — L309 Dermatitis, unspecified: Secondary | ICD-10-CM | POA: Insufficient documentation

## 2015-07-07 DIAGNOSIS — K219 Gastro-esophageal reflux disease without esophagitis: Secondary | ICD-10-CM | POA: Insufficient documentation

## 2015-07-07 DIAGNOSIS — J45909 Unspecified asthma, uncomplicated: Secondary | ICD-10-CM | POA: Insufficient documentation

## 2015-08-10 ENCOUNTER — Ambulatory Visit (INDEPENDENT_AMBULATORY_CARE_PROVIDER_SITE_OTHER): Payer: Medicare Other | Admitting: Nurse Practitioner

## 2015-08-10 ENCOUNTER — Encounter: Payer: Self-pay | Admitting: Nurse Practitioner

## 2015-08-10 VITALS — BP 116/72 | HR 64 | Ht 61.0 in | Wt 117.0 lb

## 2015-08-10 DIAGNOSIS — R8290 Unspecified abnormal findings in urine: Secondary | ICD-10-CM

## 2015-08-10 DIAGNOSIS — Z01419 Encounter for gynecological examination (general) (routine) without abnormal findings: Secondary | ICD-10-CM | POA: Diagnosis not present

## 2015-08-10 DIAGNOSIS — M810 Age-related osteoporosis without current pathological fracture: Secondary | ICD-10-CM

## 2015-08-10 DIAGNOSIS — Z0000 Encounter for general adult medical examination without abnormal findings: Secondary | ICD-10-CM

## 2015-08-10 DIAGNOSIS — Z124 Encounter for screening for malignant neoplasm of cervix: Secondary | ICD-10-CM | POA: Diagnosis not present

## 2015-08-10 MED ORDER — ESTRADIOL 2 MG VA RING: VAGINAL_RING | VAGINAL | Status: DC

## 2015-08-10 NOTE — Progress Notes (Signed)
Patient ID: Megan Logan, female   DOB: February 21, 1941, 75 y.o.   MRN: [id number]  75 y.o. G1P0001 Married  Caucasian Fe here for annual exam.  She feels well in general. She did have acute bronchitis this spring that is now gone.  During the times of bad coughing she did have urinary incontinence.  She is being retested for her allergies since she is currently off allergy shots and hopes she can discontinue allergy injections.  She currently has some urinary symptoms with incomplete urination and is concerned about possible UTI.  She would like for [place] to call her cell phone with urine results.  Very worried about her sister age 30 with lung and metasis to the brain.  They have been going to care for her in [place] and are going back today.    Patient's last menstrual period was 05/07/1982.          Sexually active: No.  The current method of family planning is abstinence and post menopausal status.    Exercising: Yes.    walking 3-4 days per week Smoker:  no  Health Maintenance: Pap: 07/2009 neg MMG: 06/04/14, 3D, Bi-Rads 1: Negative Colonoscopy: 05/2013 Colitis - Every 5 years  BMD: 03/2013 T Score: spine -2.6; left hip neck -1.9; right hip neck : -1.4           Osteoporosis Drug Holiday from Bisphosphonate's stable from 09/2010 . TDaP: 2007 Shingles: 2011 Pneumonia: several due to history of asthma, 05/29/15 (Prevnar 13) Hep C and HIV: Not indicated due to age Labs: Dr. Andrey Campanile  Urine: 1+ leuk's   reports that she has never smoked. She has never used smokeless tobacco. She reports that she does not drink alcohol or use illicit drugs.  Past Medical History  Diagnosis Date  . Asthma   . Hypercholesterolemia   . Anemia     diet related  . Atrophic vaginitis   . Hx of endometriosis     took Danacrine  . Osteoporosis     spine off Actonel sinsce 09/2010- Drug Holiday  . Colitis   . Premature ovarian failure age 19    HRT age 62 - 12/2000    Past Surgical History  Procedure  Laterality Date  . Tubal ligation Bilateral 1982  . Hysteroscopy  1996    DUB--wnl  . Nasal sinus surgery  40's and 50's    Current Outpatient Prescriptions  Medication Sig Dispense Refill  . antiseptic oral rinse (BIOTENE) LIQD 15 mLs by Mouth Rinse route as needed.    Marland Kitchen atorvastatin (LIPITOR) 10 MG tablet Take 10 mg by mouth daily.    Marland Kitchen azelastine (ASTEPRO) 137 MCG/SPRAY nasal spray Place 1 spray into the nose 2 (two) times daily. Use in each nostril as directed    . BIOTIN 5000 PO Take by mouth.    . budesonide-formoterol (SYMBICORT) 160-4.5 MCG/ACT inhaler Inhale 2 puffs into the lungs 2 (two) times daily.    . calcium carbonate (OS-CAL) 600 MG TABS Take 600 mg by mouth 2 (two) times daily with a meal.    . cetirizine (ZYRTEC) 10 MG tablet Take 10 mg by mouth daily.    . Cholecalciferol (VITAMIN D) 2000 UNITS CAPS Take 1 capsule by mouth once a week.    . ciclesonide (OMNARIS) 50 MCG/ACT nasal spray Place 2 sprays into both nostrils daily.    Marland Kitchen co-enzyme Q-10 30 MG capsule Take 30 mg by mouth 3 (three) times daily.    Marland Kitchen EPIPEN 2-PAK  0.3 MG/0.3ML DEVI     . estradiol (ESTRING) 2 MG vaginal ring USE EVERY 3 MONTHS AS DIRECTED 1 each 0  . folic acid (FOLVITE) 400 MCG tablet Take 400 mcg by mouth daily.    Marland Kitchen glucosamine-chondroitin 500-400 MG tablet Take 1 tablet by mouth 3 (three) times daily.    Marland Kitchen levalbuterol (XOPENEX) 0.31 MG/3ML nebulizer solution Take 1 ampule by nebulization every 4 (four) hours as needed for wheezing.    Marland Kitchen LIALDA 1.2 G EC tablet Take 1.2 g by mouth 4 (four) times daily.  6  . Lutein 6 MG CAPS Take 1 capsule by mouth daily.    Marland Kitchen LYSINE PO Take by mouth.    . montelukast (SINGULAIR) 10 MG tablet Take 10 mg by mouth at bedtime.    . Multiple Vitamin (MULTIVITAMIN) tablet Take 1 tablet by mouth daily.    Marland Kitchen omeprazole (PRILOSEC) 20 MG capsule Take 20 mg by mouth daily.    . pramipexole (MIRAPEX) 1 MG tablet Take 1 tablet by mouth at bedtime.  5  . pyridOXINE  (VITAMIN B-6) 100 MG tablet Take 100 mg by mouth daily.    Marland Kitchen Specialty Vitamins Products (MAGNESIUM, AMINO ACID CHELATE,) 133 MG tablet Take 1 tablet by mouth 2 (two) times daily.    Pauline Aus HFA 45 MCG/ACT inhaler      No current facility-administered medications for this visit.    Family History  Problem Relation Age of Onset  . Cancer - Colon Maternal Grandfather 51  . Prostate cancer Father 31  . Alzheimer's disease Mother 6  . Other Sister     dizziness  . Cancer Sister 11    Brain and Lung  . Ulcerative colitis Sister   . Migraines Sister     ROS:  Pertinent items are noted in HPI.  Otherwise, a comprehensive ROS was negative.  Exam:   BP 116/72 mmHg  Pulse 64  Ht  (1.549 m)  Wt 117 lb (53.071 kg)  BMI 22.12 kg/m2  LMP 05/07/1982 Height:  (154.9 cm) Ht Readings from Last 3 Encounters:  08/10/15  (1.549 m)  08/09/14  (1.549 m)  08/06/13 5' 1.25" (1.556 m)    General appearance: alert, cooperative and appears stated age Head: Normocephalic, without obvious abnormality, atraumatic Neck: no adenopathy, supple, symmetrical, trachea midline and thyroid normal to inspection and palpation Lungs: clear to auscultation bilaterally Breasts: normal appearance, no masses or tenderness Heart: regular rate and rhythm Abdomen: soft, non-tender; no masses,  no organomegaly Extremities: extremities normal, atraumatic, no cyanosis or edema Skin: Skin color, texture, turgor normal. No rashes or lesions Lymph nodes: Cervical, supraclavicular, and axillary nodes normal. No abnormal inguinal nodes palpated Neurologic: Grossly normal   Pelvic: External genitalia:  no lesions              Urethra:  normal appearing urethra with no masses, tenderness or lesions              Bartholin's and Skene's: normal                 Vagina: normal appearing vagina with normal color and discharge, no lesions.  Estring is out              Cervix: anteverted              Pap  taken: No. Bimanual Exam:  Uterus:  normal size, contour, position, consistency, mobility, non-tender  Adnexa: no mass, fullness, tenderness               Rectovaginal: Confirms               Anus:  normal sphincter tone, no lesions  Chaperone present: no  A:  Well Woman with normal exam  Atrophic Vaginitis treated with Estring and doing well Osteoporosis - "drug holiday" from Actonel X 3 yrs  R/O UTI  Situational stressors   P:   Reviewed health and wellness pertinent to exam  Pap smear as above  Mammogram is due now and will schedule along with BMD.  She has been out of town caring for sister.  Refill Estring X 1 only until Mammo is done.  Cautioned with risk of CVA, DVT, cancer, etc.  She will check with PCP about update on TDaP  Counseled on breast self exam, mammography screening, use and side effects of HRT, adequate intake of calcium and vitamin D, diet and exercise return annually or prn  An After Visit Summary was printed and given to the patient.

## 2015-08-10 NOTE — Patient Instructions (Addendum)

## 2015-08-11 ENCOUNTER — Ambulatory Visit: Payer: Medicare Other | Admitting: Nurse Practitioner

## 2015-08-11 LAB — URINE CULTURE
COLONY COUNT: NO GROWTH
Organism ID, Bacteria: NO GROWTH

## 2015-08-13 NOTE — Progress Notes (Signed)
Reviewed personally.  M. Suzanne Hassel Uphoff, MD.  

## 2015-08-23 ENCOUNTER — Other Ambulatory Visit: Payer: Self-pay

## 2015-08-23 DIAGNOSIS — Z1231 Encounter for screening mammogram for malignant neoplasm of breast: Secondary | ICD-10-CM

## 2015-08-24 ENCOUNTER — Ambulatory Visit (HOSPITAL_COMMUNITY)
Admission: RE | Admit: 2015-08-24 | Discharge: 2015-08-24 | Disposition: A | Discharge status: Home / Self Care | Payer: Medicare Other | Source: Ambulatory Visit

## 2015-08-24 ENCOUNTER — Ambulatory Visit (HOSPITAL_COMMUNITY)
Admission: RE | Admit: 2015-08-24 | Discharge: 2015-08-24 | Disposition: A | Discharge status: Home / Self Care | Payer: Medicare Other | Source: Ambulatory Visit | Attending: Nurse Practitioner | Admitting: Nurse Practitioner

## 2015-08-24 DIAGNOSIS — Z1231 Encounter for screening mammogram for malignant neoplasm of breast: Secondary | ICD-10-CM

## 2015-08-24 DIAGNOSIS — M85852 Other specified disorders of bone density and structure, left thigh: Secondary | ICD-10-CM | POA: Insufficient documentation

## 2015-08-24 DIAGNOSIS — Z13820 Encounter for screening for osteoporosis: Secondary | ICD-10-CM | POA: Insufficient documentation

## 2015-08-24 DIAGNOSIS — M8588 Other specified disorders of bone density and structure, other site: Secondary | ICD-10-CM | POA: Diagnosis not present

## 2015-08-24 DIAGNOSIS — Z79899 Other long term (current) drug therapy: Secondary | ICD-10-CM | POA: Diagnosis not present

## 2015-08-24 DIAGNOSIS — E559 Vitamin D deficiency, unspecified: Secondary | ICD-10-CM | POA: Insufficient documentation

## 2015-08-24 DIAGNOSIS — M810 Age-related osteoporosis without current pathological fracture: Secondary | ICD-10-CM

## 2015-08-26 ENCOUNTER — Telehealth: Payer: Self-pay | Admitting: Nurse Practitioner

## 2015-08-26 NOTE — Telephone Encounter (Signed)
See results note about BMD.

## 2016-07-23 ENCOUNTER — Other Ambulatory Visit: Payer: Self-pay | Admitting: Nurse Practitioner

## 2016-07-23 DIAGNOSIS — Z1231 Encounter for screening mammogram for malignant neoplasm of breast: Secondary | ICD-10-CM

## 2016-08-13 ENCOUNTER — Ambulatory Visit: Payer: Medicare Other | Admitting: Nurse Practitioner

## 2016-08-27 ENCOUNTER — Ambulatory Visit (HOSPITAL_COMMUNITY): Payer: Medicare Other

## 2016-09-03 ENCOUNTER — Encounter: Payer: Self-pay | Admitting: Nurse Practitioner

## 2016-09-04 ENCOUNTER — Ambulatory Visit (INDEPENDENT_AMBULATORY_CARE_PROVIDER_SITE_OTHER): Payer: Medicare Other | Admitting: Nurse Practitioner

## 2016-09-04 ENCOUNTER — Encounter: Payer: Self-pay | Admitting: Nurse Practitioner

## 2016-09-04 VITALS — BP 112/66 | HR 68 | Ht 61.0 in | Wt 113.0 lb

## 2016-09-04 DIAGNOSIS — N952 Postmenopausal atrophic vaginitis: Secondary | ICD-10-CM

## 2016-09-04 DIAGNOSIS — Z01411 Encounter for gynecological examination (general) (routine) with abnormal findings: Secondary | ICD-10-CM | POA: Diagnosis not present

## 2016-09-04 DIAGNOSIS — M810 Age-related osteoporosis without current pathological fracture: Secondary | ICD-10-CM

## 2016-09-04 DIAGNOSIS — E559 Vitamin D deficiency, unspecified: Secondary | ICD-10-CM

## 2016-09-04 MED ORDER — NONFORMULARY OR COMPOUNDED ITEM: 4 refills | Status: DC

## 2016-09-04 NOTE — Progress Notes (Signed)
76 y.o. G1P0001 Married  Caucasian Fe here for annual exam. On Estring until 3 months ago.  Really likes the ring for convenience.  Still SA 3-4 times a month.  She would like to change to cream and generic would be very helpful.  We have discussed the BMD done on 08/26/2015.  She is doing exercise but about 5 weeks ago stopped the upper body weights due to right neck pain.  Still hurts and will see PCP about this soon.  Will repeat BMD 08/2017.    Patient's last menstrual period was 05/07/1982 (approximate).          Sexually active: Yes.    The current method of family planning is tubal ligation and post menopausal status.    Exercising: Yes.    walking-treadmill or outside when weather permits. Smoker:  no  Health Maintenance: Pap: 07/2009, Negative  No other pap history History of Abnormal Pap: no MMG: 08/24/15, 3D-yes, Density Category C, Bi-Rads 1:  Negative, scheduled for 09/06/16 Self Breast exams: yes Colonoscopy: 05/2013, Colitis, repeat in 5 years BMD: 08/24/15 T Score: -2.4 Spine / -1.4 Right Femur Neck / -2.3 Left Femur Neck TDaP: 09/29/15 Shingles: 05/07/07 Pneumonia: 03/04/13 Pneumovax, 05/29/15 Prevnar-13 Hep C and HIV: Not indicated due to age Labs: Dr. Andrey Campanile takes care of all screening labs   reports that she has never smoked. She has never used smokeless tobacco. She reports that she does not drink alcohol or use drugs.  Past Medical History:  Diagnosis Date  . Anemia    diet related  . Asthma   . Atrophic vaginitis   . Colitis   . Hx of endometriosis    took Danacrine  . Hypercholesterolemia   . Osteoporosis    spine off Actonel sinsce 09/2010- Drug Holiday  . Premature ovarian failure age 89   HRT age 76 - 12/2000    Past Surgical History:  Procedure Laterality Date  . HYSTEROSCOPY  1996   DUB--wnl  . NASAL SINUS SURGERY  40's and 50's  . TUBAL LIGATION Bilateral 1982    Current Outpatient Prescriptions  Medication Sig Dispense Refill  . antiseptic oral  rinse (BIOTENE) LIQD 15 mLs by Mouth Rinse route as needed.    Marland Kitchen atorvastatin (LIPITOR) 10 MG tablet Take 10 mg by mouth daily.    Marland Kitchen azelastine (ASTEPRO) 137 MCG/SPRAY nasal spray Place 1 spray into the nose 2 (two) times daily. Use in each nostril as directed    . BIOTIN 5000 PO Take by mouth.    Marland Kitchen BREO ELLIPTA 200-25 MCG/INH AEPB Inhale 1 puff into the lungs daily.  1  . budesonide-formoterol (SYMBICORT) 160-4.5 MCG/ACT inhaler Inhale 2 puffs into the lungs 2 (two) times daily.    . calcium carbonate (OS-CAL) 600 MG TABS Take 600 mg by mouth 2 (two) times daily with a meal.    . cetirizine (ZYRTEC) 10 MG tablet Take 10 mg by mouth daily.    . Cholecalciferol (VITAMIN D) 2000 UNITS CAPS Take 1 capsule by mouth once a week.    . co-enzyme Q-10 30 MG capsule Take 30 mg by mouth 3 (three) times daily.    Marland Kitchen esomeprazole (NEXIUM) 40 MG capsule Take 40 mg by mouth daily at 12 noon.    . folic acid (FOLVITE) 400 MCG tablet Take 400 mcg by mouth daily.    Marland Kitchen glucosamine-chondroitin 500-400 MG tablet Take 1 tablet by mouth 3 (three) times daily.    Marland Kitchen levalbuterol (XOPENEX) 0.31 MG/3ML nebulizer  solution Take 1 ampule by nebulization every 4 (four) hours as needed for wheezing.    . Lutein 6 MG CAPS Take 1 capsule by mouth daily.    Marland Kitchen LYSINE PO Take by mouth.    . meloxicam (MOBIC) 15 MG tablet Take 1 tablet by mouth daily as needed for pain.  3  . montelukast (SINGULAIR) 10 MG tablet Take 10 mg by mouth at bedtime.    . Multiple Vitamin (MULTIVITAMIN) tablet Take 1 tablet by mouth daily.    . pramipexole (MIRAPEX) 1 MG tablet Take 1 tablet by mouth at bedtime.  5  . pyridOXINE (VITAMIN B-6) 100 MG tablet Take 100 mg by mouth daily.    Marland Kitchen Specialty Vitamins Products (MAGNESIUM, AMINO ACID CHELATE,) 133 MG tablet Take 1 tablet by mouth 2 (two) times daily.    Pauline Aus HFA 45 MCG/ACT inhaler      No current facility-administered medications for this visit.     Family History  Problem Relation Age of  Onset  . Prostate cancer Father 46  . Alzheimer's disease Mother 76  . Other Sister     dizziness  . Cancer Sister 1    Brain and Lung  . Ulcerative colitis Sister   . Migraines Sister   . Cancer - Colon Maternal Grandfather 51    ROS:  Pertinent items are noted in HPI.  Otherwise, a comprehensive ROS was negative.  Exam:   BP 112/66 (BP Location: Right Arm, Patient Position: Sitting, Cuff Size: Normal)   Pulse 68   Ht  (1.549 m)   Wt 113 lb (51.3 kg)   LMP 05/07/1982 (Approximate)   BMI 21.35 kg/m  Height:  (154.9 cm) Ht Readings from Last 3 Encounters:  09/04/16  (1.549 m)  08/10/15  (1.549 m)  08/09/14  (1.549 m)    General appearance: alert, cooperative and appears stated age Head: Normocephalic, without obvious abnormality, atraumatic Neck: no adenopathy, supple, symmetrical, trachea midline and thyroid normal to inspection and palpation Lungs: clear to auscultation bilaterally Breasts: normal appearance, no masses or tenderness Heart: regular rate and rhythm Abdomen: soft, non-tender; no masses,  no organomegaly Extremities: extremities normal, atraumatic, no cyanosis or edema Skin: Skin color, texture, turgor normal. No rashes or lesions Lymph nodes: Cervical, supraclavicular, and axillary nodes normal. No abnormal inguinal nodes palpated Neurologic: Grossly normal   Pelvic: External genitalia:  no lesions              Urethra:  normal appearing urethra with no masses, tenderness or lesions              Bartholin's and Skene's: normal                 Vagina: normal appearing vagina with normal color and discharge, no lesions              Cervix: absent              Pap taken: No. Bimanual Exam:  Uterus:  uterus absent              Adnexa: no mass, fullness, tenderness               Rectovaginal: Confirms               Anus:  normal sphincter tone, no lesions  Chaperone present: yes  A:  Well Woman with normal exam  Atrophic  Vaginitis treated with prior use of Estring until  05/2016 Osteoporosis - "drug holiday" from Actonel since 09/2010  BMD is stable but will need a repeat 08/2017  Right cervical neck pain.- to see PCP   P:   Reviewed health and wellness pertinent to exam  Pap smear: no  Mammogram is scheduled for 09/2016  Will change Estring to Estrace vaginal cream generic to help with cost and use 1/2 gm 1-2 times a week.  Counseled on breast self exam, mammography screening, use and side effects of HRT, adequate intake of calcium and vitamin D, diet and exercise, Kegel's exercises return annually or prn  An After Visit Summary was printed and given to the patient.

## 2016-09-04 NOTE — Patient Instructions (Addendum)

## 2016-09-05 NOTE — Progress Notes (Signed)
Encounter reviewed by Dr. Brook Amundson C. Silva.  

## 2016-09-06 ENCOUNTER — Ambulatory Visit (HOSPITAL_COMMUNITY)
Admission: RE | Admit: 2016-09-06 | Discharge: 2016-09-06 | Disposition: A | Discharge status: Home / Self Care | Payer: Medicare Other | Source: Ambulatory Visit | Attending: Nurse Practitioner | Admitting: Nurse Practitioner

## 2016-09-06 DIAGNOSIS — Z1231 Encounter for screening mammogram for malignant neoplasm of breast: Secondary | ICD-10-CM | POA: Insufficient documentation

## 2016-09-19 DIAGNOSIS — G8929 Other chronic pain: Secondary | ICD-10-CM | POA: Insufficient documentation

## 2016-09-19 DIAGNOSIS — M542 Cervicalgia: Secondary | ICD-10-CM | POA: Insufficient documentation

## 2016-11-27 ENCOUNTER — Ambulatory Visit: Payer: Medicare Other | Attending: Physical Medicine and Rehabilitation | Admitting: Physical Therapy

## 2016-11-27 ENCOUNTER — Encounter: Payer: Self-pay | Admitting: Physical Therapy

## 2016-11-27 DIAGNOSIS — R293 Abnormal posture: Secondary | ICD-10-CM | POA: Diagnosis present

## 2016-11-27 DIAGNOSIS — M542 Cervicalgia: Secondary | ICD-10-CM | POA: Insufficient documentation

## 2016-11-27 NOTE — Therapy (Signed)
[facility] [address] [place], [place], [address] Phone: [phone]   Fax:  [phone]  Physical Therapy Evaluation  Patient Details  Name: Megan Logan MRN: [id number] Date of Birth: 1941/01/28 Referring Provider: Sheran Luz MD  Encounter Date: 11/27/2016      PT End of Session - 11/27/16 1135    Visit Number 1   Number of Visits 12   Date for PT Re-Evaluation 12/18/16   PT Start Time 0900   PT Stop Time 0951   PT Time Calculation (min) 51 min   Activity Tolerance Patient tolerated treatment well   Behavior During Therapy [facility] for tasks assessed/performed      Past Medical History:  Diagnosis Date  . Anemia    diet related  . Asthma   . Atrophic vaginitis   . Colitis   . Hx of endometriosis    took Danacrine  . Hypercholesterolemia   . Osteoporosis    spine off Actonel sinsce 09/2010- Drug Holiday  . Premature ovarian failure age 29   HRT age 7 - 12/2000    Past Surgical History:  Procedure Laterality Date  . HYSTEROSCOPY  1996   DUB--wnl  . NASAL SINUS SURGERY  40's and 50's  . TUBAL LIGATION Bilateral 1982    There were no vitals filed for this visit.       Subjective Assessment - 11/27/16 1143    Subjective The presents to OPPT with c/o neck pain right side much worse than left.  The pain started a couple of months ago but she has had on/off pain for a couple of years.  She has pain at night and upon waking and is trying to find a good pillow.  Her pain is an 8/10 with movement of her neck and pain medications decrease her pain.  She has had to limit her regular exercise regimen as this aggravates her neck.            [facility] PT Assessment - 11/27/16 0001      Assessment   Medical Diagnosis Degeneration of C5-C6 intervertebral disc.   Referring Provider Sheran Luz MD   Onset Date/Surgical Date --  2 months.     Precautions   Precautions --  OP.  No mechanical traction.      Restrictions   Weight Bearing Restrictions No     Balance Screen   Has the patient fallen in the past 6 months No   Has the patient had a decrease in activity level because of a fear of falling?  No   Is the patient reluctant to leave their home because of a fear of falling?  No     Home Environment   Living Environment Private residence     Prior Function   Level of Independence Independent     Posture/Postural Control   Posture Comments head 2 inch forward of rounded shoulder posture.     ROM / Strength   AROM / PROM / Strength AROM;Strength     AROM   Overall AROM Comments Bilateral active cervical rotation= 35 degrees and bilateral sidebending= 15 degrees.     Strength   Overall Strength Comments Normal bilateral UE strength.     Palpation   Palpation comment Tender to palpation over bilateral cervical paraspinal musculature right > left.     Special Tests    Special Tests --  UE DTR's= 1+/4+.     Ambulation/Gait   Gait Comments WNL.  Objective measurements completed on examination: See above findings.          [facility] Adult PT Treatment/Exercise - 11/27/16 0001      Modalities   Modalities Electrical Stimulation;Moist Heat     Moist Heat Therapy   Number Minutes Moist Heat 15 Minutes   Moist Heat Location --  Cervical.     Electrical Stimulation   Electrical Stimulation Location Cervical.   Electrical Stimulation Action IFC   Electrical Stimulation Parameters 80-150 HZ x 15 minutes.   Electrical Stimulation Goals Tone;Wound                  PT Short Term Goals - 11/27/16 1139      PT SHORT TERM GOAL #1   Title STG's=LTG's.           PT Long Term Goals - 11/27/16 1139      PT LONG TERM GOAL #1   Title Independent with a HEP.   Time 4   Period Weeks   Status New     PT LONG TERM GOAL #2   Title Increase active cervical rotation to 70 degrees+ so patient can turn head more easily while driving.   Time 4    Period Weeks   Status New     PT LONG TERM GOAL #3   Title Perform ADL's and normal exercise activites with pain not > 2-3/10.   Time 4   Period Weeks   Status New                Plan - 11/27/16 1136    Clinical Impression Statement The patient presents with right sided neck pain.  She has severe restrictions of range of motion.  Her pain and deficits prevent her from exercising like she would.  Patient will benefit from skilled physical therapy.   History and Personal Factors relevant to plan of care: OP.   Clinical Presentation Stable   Clinical Decision Making Low   Rehab Potential Good   PT Frequency 3x / week   PT Duration 4 weeks   PT Treatment/Interventions ADLs/Self Care Home Management;Cryotherapy;Electrical Stimulation;Moist Heat;Ultrasound;Patient/family education;Therapeutic exercise;Therapeutic activities;Manual techniques;Dry needling   PT Next Visit Plan Combo e'stim/U/S; STW/M; gentle manual traction; cervical range of motion exercises and postural improvment exercises.  HMP and e'stim.      Patient will benefit from skilled therapeutic intervention in order to improve the following deficits and impairments:  Pain, Decreased activity tolerance, Decreased range of motion  Visit Diagnosis: Cervicalgia - Plan: PT plan of care cert/re-cert  Abnormal posture - Plan: PT plan of care cert/re-cert      G-Codes - 11/27/16 1144    Functional Assessment Tool Used (Outpatient Only) FOTO...54% limitation.   Functional Limitation Self care   Self Care Current Status [phone]([id number]) At least 1 percent but less than 20 percent impaired, limited or restricted       Problem List Patient Active Problem List   Diagnosis Date Noted  . 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 Unspecified vitamin D deficiency 08/06/2013  . Osteoporosis, unspecified 08/04/2012  . 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    APPLEGATE, Megan Logan 11/27/2016, 11:46 AM  [facility] [address] [place], [place], [address] Phone: [phone]   Fax:  [phone]  Name: Megan Logan MRN: [id number] Date of Birth: 1940-07-17

## 2016-11-28 ENCOUNTER — Ambulatory Visit: Payer: Medicare Other | Admitting: Physical Therapy

## 2016-11-28 DIAGNOSIS — M542 Cervicalgia: Secondary | ICD-10-CM

## 2016-11-28 DIAGNOSIS — R293 Abnormal posture: Secondary | ICD-10-CM

## 2016-11-28 NOTE — Therapy (Signed)
[facility] [address] [place], [place], [address] Phone: [phone]   Fax:  [phone]  Physical Therapy Treatment  Patient Details  Name: Megan Logan MRN: [id number] Date of Birth: Sep 13, 1940 Referring Provider: Sheran Luzichard Ramos MD  Encounter Date: 11/28/2016      PT End of Session - 11/28/16 0941    Visit Number 2   Number of Visits 12   Date for PT Re-Evaluation 12/18/16   PT Start Time 0902   PT Stop Time 0951   PT Time Calculation (min) 49 min   Activity Tolerance Patient tolerated treatment well   Behavior [facility] for tasks assessed/performed      Past Medical History:  Diagnosis Date  . Anemia    diet related  . Asthma   . Atrophic vaginitis   . Colitis   . Hx of endometriosis    took Danacrine  . Hypercholesterolemia   . Osteoporosis    spine off Actonel sinsce 09/2010- Drug [place]  . Premature ovarian failure age 89   HRT age 76 - 12/2000    Past Surgical History:  Procedure Laterality Date  . HYSTEROSCOPY  1996   DUB--wnl  . NASAL SINUS SURGERY  40's and 50's  . TUBAL LIGATION Bilateral 1982    There were no vitals filed for this visit.      Subjective Assessment - 11/28/16 0903    Subjective neck felt better after session yesterday but woke up at 4 AM hurting "but not as bad."   Patient Stated Goals Get out of pain so I can exercise more.   Currently in Pain? Yes   Pain Score 8   "it's hurting a little bit"   Pain Location Neck   Pain Orientation Right   Pain Descriptors / Indicators Sharp;Dull;Aching   Pain Type Acute pain   Pain Onset More than a month ago   Pain Frequency Constant   Aggravating Factors  see eval   Pain Relieving Factors see eval                         [facility] Adult PT Treatment/Exercise - 11/28/16 0907      Exercises   Exercises Neck     Neck Exercises: Machines for Strengthening   UBE (Upper Arm Bike) 600 RPM x 5 min forward     Neck Exercises: Seated   Shoulder Rolls Backwards;15 reps   Other Seated Exercise scapular retraction x 15 reps with 3 sec hold     Electrical Stimulation   Electrical Stimulation Location Cervical.   Electrical Stimulation Action IFC   Electrical Stimulation Parameters to tolerance x 15 min   Electrical Stimulation Goals Pain;Tone     Manual Therapy   Manual Therapy Soft tissue mobilization;Myofascial release   Manual therapy comments pt supine   Soft tissue mobilization Rt upper trap, levator scapula and c-spine paraspinals   Myofascial Release Rt upper trap, levator scapula and c-spine paraspinals     Neck Exercises: Stretches   Upper Trapezius Stretch 3 reps;30 seconds   Upper Trapezius Stretch Limitations bil                PT Education - 11/28/16 0941    Education provided Yes   Education Details HEP and TDN   Person(s) Educated Patient   Methods Explanation;Demonstration;Handout   Comprehension Verbalized understanding;Returned demonstration;Need further instruction          PT Short Term Goals -  11/27/16 1139      PT SHORT TERM GOAL #1   Title STG's=LTG's.           PT Long Term Goals - 11/27/16 1139      PT LONG TERM GOAL #1   Title Independent with a HEP.   Time 4   Period Weeks   Status New     PT LONG TERM GOAL #2   Title Increase active cervical rotation to 70 degrees+ so patient can turn head more easily while driving.   Time 4   Period Weeks   Status New     PT LONG TERM GOAL #3   Title Perform ADL's and normal exercise activites with pain not > 2-3/10.   Time 4   Period Weeks   Status New               Plan - 11/28/16 0941    Clinical Impression Statement Pt with active trigger points in upper trap and levator scapula on Rt side, and may benefit from TDN.  Education provided today.  Will continue to benefit from PT to maximize function.  HEP initiated today to help with posture and ROM.   PT Treatment/Interventions  ADLs/Self Care Home Management;Cryotherapy;Electrical Stimulation;Moist Heat;Ultrasound;Patient/family education;Therapeutic exercise;Therapeutic activities;Manual techniques;Dry needling   PT Next Visit Plan Combo e'stim/U/S; STW/M; gentle manual traction; cervical range of motion exercises and postural improvment exercises.  HMP and e'stim., TDN   Consulted and Agree with Plan of Care Patient      Patient will benefit from skilled therapeutic intervention in order to improve the following deficits and impairments:  Pain, Decreased activity tolerance, Decreased range of motion  Visit Diagnosis: Cervicalgia  Abnormal posture       G-Codes - 11/27/16 1144    Functional Assessment Tool Used (Outpatient Only) FOTO...54% limitation.   Functional Limitation Self care   Self Care Current Status [phone]([id number]) At least 1 percent but less than 20 percent impaired, limited or restricted      Problem List Patient Active Problem List   Diagnosis Date Noted  . 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 Unspecified vitamin D deficiency 08/06/2013  . Osteoporosis, unspecified 08/04/2012  . 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      Clarita CraneStephanie F Itzel [place]ore, PT, DPT 11/28/16 9:43 AM    [facility] [address] [place], [place], [address] Phone: [phone]   Fax:  [phone]  Name: Megan Logan MRN: [id number] Date of Birth: [birth year]

## 2016-11-28 NOTE — Patient Instructions (Addendum)
Flexibility: Upper Trapezius Stretch    Gently grasp right side of head while reaching behind back with other hand. Tilt head away until a gentle stretch is felt. Hold _30___ seconds. Repeat _3___ times per set on each side. Do __1__ sets per session. Do __2-3__ sessions per day.  Levator Scapula Stretch, Sitting    Sit, one hand tucked under hip on side to be stretched, other hand over top of head. Turn head toward other side and look down. Use hand on head to gently stretch neck in that position. Hold _30__ seconds. Repeat _3__ times per session on each side. Do _2-3__ sessions per day.  Healthy Back - Shoulder Roll    Stand straight with arms relaxed at sides. Roll shoulders backward continuously. Do __10-15__ times. Perform several sessions throughout the day.  Scapular Retraction (Standing)    With arms at sides, pinch shoulder blades together. Repeat __10-15__ times per set. Do __1_ sets per session. Do _2-3___ sessions per day.               Trigger Point Dry Needling  . What is Trigger Point Dry Needling (DN)? o DN is a physical therapy technique used to treat muscle pain and dysfunction. Specifically, DN helps deactivate muscle trigger points (muscle knots).  o A thin filiform needle is used to penetrate the skin and stimulate the underlying trigger point. The goal is for a local twitch response (LTR) to occur and for the trigger point to relax. No medication of any kind is injected during the procedure.   . What Does Trigger Point Dry Needling Feel Like?  o The procedure feels different for each individual patient. Some patients report that they do not actually feel the needle enter the skin and overall the process is not painful. Very mild bleeding may occur. However, many patients feel a deep cramping in the muscle in which the needle was inserted. This is the local twitch response.   Marland Kitchen. How Will I feel after the treatment? o Soreness is normal, and the  onset of soreness may not occur for a few hours. Typically this soreness does not last longer than two days.  o Bruising is uncommon, however; ice can be used to decrease any possible bruising.  o In rare cases feeling tired or nauseous after the treatment is normal. In addition, your symptoms may get worse before they get better, this period will typically not last longer than 24 hours.   . What Can I do After My Treatment? o Increase your hydration by drinking more water for the next 24 hours. o You may place ice or heat on the areas treated that have become sore, however, do not use heat on inflamed or bruised areas. Heat often brings more relief post needling. o You can continue your regular activities, but vigorous activity is not recommended initially after the treatment for 24 hours. o DN is best combined with other physical therapy such as strengthening, stretching, and other therapies.

## 2016-12-03 ENCOUNTER — Ambulatory Visit: Payer: Medicare Other | Admitting: Physical Therapy

## 2016-12-03 DIAGNOSIS — M542 Cervicalgia: Secondary | ICD-10-CM

## 2016-12-03 DIAGNOSIS — R293 Abnormal posture: Secondary | ICD-10-CM

## 2016-12-03 NOTE — Therapy (Signed)
[facility] [address] [place], [place], [address] Phone: [phone]   Fax:  [phone]  Physical Therapy Treatment  Patient Details  Name: Megan Logan MRN: [id number] Date of Birth: 1940-11-24 Referring Provider: Sheran Luzichard Ramos MD  Encounter Date: 12/03/2016      PT End of Session - 12/03/16 1150    Visit Number 3   Number of Visits 12   Date for PT Re-Evaluation 12/18/16   PT Start Time 0945   PT Stop Time 1039   PT Time Calculation (min) 54 min   Activity Tolerance Patient tolerated treatment well      Past Medical History:  Diagnosis Date  . Anemia    diet related  . Asthma   . Atrophic vaginitis   . Colitis   . Hx of endometriosis    took Danacrine  . Hypercholesterolemia   . Osteoporosis    spine off Actonel sinsce 09/2010- Drug [place]  . Premature ovarian failure age 76   HRT age 76 - 12/2000    Past Surgical History:  Procedure Laterality Date  . HYSTEROSCOPY  1996   DUB--wnl  . NASAL SINUS SURGERY  40's and 50's  . TUBAL LIGATION Bilateral 1982    There were no vitals filed for this visit.      Subjective Assessment - 12/03/16 1154    Subjective I woke up at 3:30 am with neck pain.  I know I need a better pillow.   Patient Stated Goals Get out of pain so I can exercise more.   Pain Score 8    Pain Location Neck   Pain Orientation Right   Pain Descriptors / Indicators Sharp;Dull;Aching   Pain Onset More than a month ago                         [facility]/Exercise - 12/03/16 0001      Modalities   Modalities Ultrasound     Electrical Stimulation   Electrical Stimulation Location --  Right cervical spine.   Electrical Stimulation Action Constant Pre-mod.   Electrical Stimulation Parameters 80-150 Hz x 15 minutes.   Electrical Stimulation Goals Tone;Pain     Ultrasound   Ultrasound Location Combo e'stim/U/S to bilateral C-spine 12 minutes each side (24  minutes total).   Ultrasound Parameters 1.40 W/CM2.   Ultrasound Goals Pain                  PT Short Term Goals - 11/27/16 1139      PT SHORT TERM GOAL #1   Title STG's=LTG's.           PT Long Term Goals - 11/27/16 1139      PT LONG TERM GOAL #1   Title Independent with a HEP.   Time 4   Period Weeks   Status New     PT LONG TERM GOAL #2   Title Increase active cervical rotation to 70 degrees+ so patient can turn head more easily while driving.   Time 4   Period Weeks   Status New     PT LONG TERM GOAL #3   Title Perform ADL's and normal exercise activites with pain not > 2-3/10.   Time 4   Period Weeks   Status New               Plan - 12/03/16 1207    Clinical Impression Statement The patient with increased pain  today due to sleeping posture.  Patient considering trying a "My Pillow."   PT Treatment/Interventions ADLs/Self Care Home Management;Cryotherapy;Electrical Stimulation;Moist Heat;Ultrasound;Patient/family education;Therapeutic exercise;Therapeutic activities;Manual techniques;Dry needling   PT Next Visit Plan Combo e'stim/U/S; STW/M; gentle manual traction; cervical range of motion exercises and postural improvment exercises.  HMP and e'stim., TDN      Patient will benefit from skilled therapeutic intervention in order to improve the following deficits and impairments:  Pain, Decreased activity tolerance, Decreased range of motion  Visit Diagnosis: Cervicalgia  Abnormal posture     Problem List Patient Active Problem List   Diagnosis Date Noted  . 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 Unspecified vitamin D deficiency 08/06/2013  . Osteoporosis, unspecified 08/04/2012  . 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    Kristyna Bradstreet, [place] MPT 12/03/2016, 12:11 PM  [facility] [address] [place], [place], [address] Phone: [phone]   Fax:  [phone]  Name: Megan Logan MRN: [id number] Date of Birth: Sep 07, 1940

## 2016-12-06 ENCOUNTER — Encounter: Payer: Self-pay | Admitting: Physical Therapy

## 2016-12-06 ENCOUNTER — Ambulatory Visit: Payer: Medicare Other | Attending: Physical Medicine and Rehabilitation | Admitting: Physical Therapy

## 2016-12-06 DIAGNOSIS — M542 Cervicalgia: Secondary | ICD-10-CM | POA: Diagnosis present

## 2016-12-06 DIAGNOSIS — R293 Abnormal posture: Secondary | ICD-10-CM | POA: Diagnosis present

## 2016-12-06 NOTE — Therapy (Signed)
[facility] [address] [place], [place], [address] Phone: [phone]   Fax:  [phone]  Physical Therapy Treatment  Patient Details  Name: Megan SaxBonnie M Michna MRN: [id number] Date of Birth: [birth year] Referring Provider: Sheran Luzichard Ramos MD  Encounter Date: 12/06/2016      PT End of Session - 12/06/16 0917    Visit Number 4   Number of Visits 12   Date for PT Re-Evaluation 12/18/16   PT Start Time 0900   PT Stop Time 0949   PT Time Calculation (min) 49 min   Activity Tolerance Patient tolerated treatment well   Behavior During Therapy [facility] for tasks assessed/performed      Past Medical History:  Diagnosis Date  . Anemia    diet related  . Asthma   . Atrophic vaginitis   . Colitis   . Hx of endometriosis    took Danacrine  . Hypercholesterolemia   . Osteoporosis    spine off Actonel sinsce 09/2010- Drug Holiday  . Premature ovarian failure age 76   HRT age 76 - 12/2000    Past Surgical History:  Procedure Laterality Date  . HYSTEROSCOPY  1996   DUB--wnl  . NASAL SINUS SURGERY  40's and 50's  . TUBAL LIGATION Bilateral 1982    There were no vitals filed for this visit.      Subjective Assessment - 12/06/16 0902    Subjective Patient feels no improvements   Pertinent History OP.   Patient Stated Goals Get out of pain so I can exercise more.   Currently in Pain? Yes   Pain Score 8    Pain Location Neck   Pain Orientation Right   Pain Descriptors / Indicators Aching;Discomfort;Sharp   Pain Type Acute pain   Pain Onset More than a month ago   Pain Frequency Constant   Aggravating Factors  sleeping and wrong position   Pain Relieving Factors at rest and good posture                         [facility] Adult PT Treatment/Exercise - 12/06/16 0001      Electrical Stimulation   Electrical Stimulation Location Cervical.   Electrical Stimulation Action IFC   Electrical Stimulation Parameters 80-150hz   x9715min   Electrical Stimulation Goals Tone;Pain     Ultrasound   Ultrasound Location right c-cpine   Ultrasound Parameters 1.4w/cm2/50%/431mhz x7410min   Ultrasound Goals Pain     Manual Therapy   Manual Therapy Soft tissue mobilization;Myofascial release   Soft tissue mobilization Rt /Lt upper trap, levator scapula and c-spine paraspinals   Myofascial Release Rt upper trap, levator scapula and c-spine paraspinals                  PT Short Term Goals - 11/27/16 1139      PT SHORT TERM GOAL #1   Title STG's=LTG's.           PT Long Term Goals - 12/06/16 [id number]      PT LONG TERM GOAL #1   Title Independent with a HEP.   Time 4   Period Weeks   Status On-going     PT LONG TERM GOAL #2   Title Increase active cervical rotation to 70 degrees+ so patient can turn head more easily while driving.   Time 4   Period Weeks   Status On-going     PT LONG TERM GOAL #3   Title Perform ADL's  and normal exercise activites with pain not > 2-3/10.   Time 4   Period Weeks   Status On-going               Plan - 12/06/16 0936    Clinical Impression Statement Patient tolerated treatment well today. Patient has increased pain in right cervial c-spine and UT area with muscle tightness. Patient was educated on posture techniques to help maintain neutral cervical spine to avoid further pain. Patient reported doing HEP as instructed by PT. Current goals ongoing due to pian deficts.    Rehab Potential Good   PT Frequency 3x / week   PT Duration 4 weeks   PT Next Visit Plan Combo e'stim/U/S; STW/M; gentle manual traction; cervical range of motion exercises and postural improvment exercises.  HMP and e'stim., TDN   Consulted and Agree with Plan of Care Patient      Patient will benefit from skilled therapeutic intervention in order to improve the following deficits and impairments:  Pain, Decreased activity tolerance, Decreased range of motion  Visit  Diagnosis: Cervicalgia  Abnormal posture     Problem List Patient Active Problem List   Diagnosis Date Noted  . 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 Unspecified vitamin D deficiency 08/06/2013  . Osteoporosis, unspecified 08/04/2012  . 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    Devory Mckinzie P, PTA 12/06/2016, 9:54 AM  [facility] [address] [place], [place], [address] Phone: [phone]   Fax:  [phone]  Name: Megan SaxBonnie M Logan MRN: [id number] Date of Birth: 06-23-1940

## 2016-12-10 ENCOUNTER — Ambulatory Visit: Payer: Medicare Other | Admitting: Physical Therapy

## 2016-12-10 DIAGNOSIS — M542 Cervicalgia: Secondary | ICD-10-CM

## 2016-12-10 DIAGNOSIS — R293 Abnormal posture: Secondary | ICD-10-CM

## 2016-12-10 NOTE — Patient Instructions (Signed)
Trigger Point Dry Needling  . What is Trigger Point Dry Needling (DN)? o DN is a physical therapy technique used to treat muscle pain and dysfunction. Specifically, DN helps deactivate muscle trigger points (muscle knots).  o A thin filiform needle is used to penetrate the skin and stimulate the underlying trigger point. The goal is for a local twitch response (LTR) to occur and for the trigger point to relax. No medication of any kind is injected during the procedure.   . What Does Trigger Point Dry Needling Feel Like?  o The procedure feels different for each individual patient. Some patients report that they do not actually feel the needle enter the skin and overall the process is not painful. Very mild bleeding may occur. However, many patients feel a deep cramping in the muscle in which the needle was inserted. This is the local twitch response.   Marland Kitchen. How Will I feel after the treatment? o Soreness is normal, and the onset of soreness may not occur for a few hours. Typically this soreness does not last longer than two days.  o Bruising is uncommon, however; ice can be used to decrease any possible bruising.  o In rare cases feeling tired or nauseous after the treatment is normal. In addition, your symptoms may get worse before they get better, this period will typically not last longer than 24 hours.   . What Can I do After My Treatment? o Increase your hydration by drinking more water for the next 24 hours. o You may place ice or heat on the areas treated that have become sore, however, do not use heat on inflamed or bruised areas. Heat often brings more relief post needling. o You can continue your regular activities, but vigorous activity is not recommended initially after the treatment for 24 hours. o DN is best combined with other physical therapy such as strengthening, stretching, and other therapies.    Precautions:  In some cases, dry needling is done over the lung field. While rare,  there is a risk of pneumothorax (punctured lung). Because of this, if you ever experience shortness of breath on exertion, difficulty taking a deep breath, chest pain or a dry cough following dry needling, you should report to an emergency room and tell them that you have been dry needled over the thorax.   Solon PalmJulie Gini Caputo, PT 12/10/16 9:05 AM [facility] [address] [place], [place], [address] Phone: [phone]   Fax:  [phone]

## 2016-12-10 NOTE — Therapy (Signed)
[facility] [address] [place], [place], [address] Phone: [phone]   Fax:  [phone]  Physical Therapy Treatment  Patient Details  Name: Megan Logan MRN: [id number] Date of Birth: 12-11-1940 Referring Provider: Sheran Luzichard Ramos MD  Encounter Date: 12/10/2016      PT End of Session - 12/10/16 0814    Visit Number 5   Number of Visits 12   Date for PT Re-Evaluation 12/18/16   PT Start Time 0815   PT Stop Time 0900   PT Time Calculation (min) 45 min   Activity Tolerance Patient tolerated treatment well   Behavior During Therapy [facility] for tasks assessed/performed      Past Medical History:  Diagnosis Date  . Anemia    diet related  . Asthma   . Atrophic vaginitis   . Colitis   . Hx of endometriosis    took Danacrine  . Hypercholesterolemia   . Osteoporosis    spine off Actonel sinsce 09/2010- Drug [facility]  . Premature ovarian failure age 76   HRT age 76 - 12/2000    Past Surgical History:  Procedure Laterality Date  . HYSTEROSCOPY  1996   DUB--wnl  . NASAL SINUS SURGERY  40's and 50's  . TUBAL LIGATION Bilateral 1982    There were no vitals filed for this visit.      Subjective Assessment - 12/10/16 0814    Subjective Patient reports only some improvements. Her main complaint is sleeping.    Pertinent History OP.   Patient Stated Goals Get out of pain so I can exercise more.   Currently in Pain? Yes   Pain Score 8    Pain Location Neck   Pain Orientation Right;Left   Pain Descriptors / Indicators Aching;Clance BollSharp                         [facility] Adult PT Treatment/Exercise - 12/10/16 0001      Neck Exercises: Seated   Neck Retraction --  3 reps x 5 sec     Neck Exercises: Prone   Other Prone Exercise POE with neck diagonals x 10 each way     Ultrasound   Ultrasound Location B UT and cervical paraspinals   Ultrasound Parameters 1.5 w/cm2 1 mhz x 10 min   Ultrasound Goals Pain     Manual  Therapy   Manual Therapy Soft tissue mobilization   Soft tissue mobilization to B UT/lev scap and suboccipitals          Trigger Point Dry Needling - 12/10/16 0909    Consent Given? Yes   Education Handout Provided Yes   Muscles Treated Upper Body Upper trapezius;Suboccipitals muscle group;Levator scapulae  and C2 and C3 multifidi B   Upper Trapezius Response Twitch reponse elicited;Palpable increased muscle length   SubOccipitals Response Twitch response elicited;Palpable increased muscle length   Levator Scapulae Response Twitch response elicited;Palpable increased muscle length              PT Education - 12/10/16 0905    Education provided Yes   Education Details DN education and aftercare; HEP   Person(s) Educated Patient   Methods Explanation;Demonstration;Verbal cues;Handout   Comprehension Verbalized understanding;Returned demonstration          PT Short Term Goals - 11/27/16 1139      PT SHORT TERM GOAL #1   Title STG's=LTG's.           PT Long Term Goals -  12/06/16 0918      PT LONG TERM GOAL #1   Title Independent with a HEP.   Time 4   Period Weeks   Status On-going     PT LONG TERM GOAL #2   Title Increase active cervical rotation to 70 degrees+ so patient can turn head more easily while driving.   Time 4   Period Weeks   Status On-going     PT LONG TERM GOAL #3   Title Perform ADL's and normal exercise activites with pain not > 2-3/10.   Time 4   Period Weeks   Status On-going               Plan - 12/10/16 0910    Clinical Impression Statement Patient presented today with continued c/o B neck pain R > L. She demonstrated marked tightness with TPs Bil in UT, Levator Scap and suboccipitals. She was educated regarding TPDN and consented to treatment. She responded well with localized twitch responses elicited in all muscles L>R. Normal response to modaliites and STW. Good response to prone neck diagonals reporting no pain with  cervical extension afterward.   PT Treatment/Interventions ADLs/Self Care Home Management;Cryotherapy;Electrical Stimulation;Moist Heat;Ultrasound;Patient/family education;Therapeutic exercise;Therapeutic activities;Manual techniques;Dry needling   PT Next Visit Plan Assess DN and goals, Continue with cervical retraction and prone neck diagonals; Combo e'stim/U/S; STW/M; gentle manual traction; cervical range of motion exercises and postural improvment exercises.  modalities prn   PT Home Exercise Plan POE neck diagonals x 10 ea, 2x/day      Patient will benefit from skilled therapeutic intervention in order to improve the following deficits and impairments:  Pain, Decreased activity tolerance, Decreased range of motion  Visit Diagnosis: Cervicalgia  Abnormal posture     Problem List Patient Active Problem List   Diagnosis Date Noted  . 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 Unspecified vitamin D deficiency 08/06/2013  . Osteoporosis, unspecified 08/04/2012  . 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    Solon Palm PT 12/10/2016, 9:16 AM  [facility] [address] [place], [place], [address] Phone: [phone]   Fax:  [phone]  Name: Megan Logan MRN: [id number] Date of Birth: 15-Jun-1940

## 2016-12-14 ENCOUNTER — Encounter: Payer: Self-pay | Admitting: Physical Therapy

## 2016-12-14 ENCOUNTER — Ambulatory Visit: Payer: Medicare Other | Admitting: Physical Therapy

## 2016-12-14 DIAGNOSIS — M542 Cervicalgia: Secondary | ICD-10-CM | POA: Diagnosis not present

## 2016-12-14 DIAGNOSIS — R293 Abnormal posture: Secondary | ICD-10-CM

## 2016-12-14 NOTE — Therapy (Signed)
[facility] [address] [place], [place], [address] Phone: [phone]   Fax:  [phone]  Physical Therapy Treatment  Patient Details  Name: Megan Logan MRN: [id number] Date of Birth: Jul 16, 1940 Referring Provider: Sheran Luzichard Ramos MD  Encounter Date: 12/14/2016      PT End of Session - 12/14/16 0903    Visit Number 6   Number of Visits 12   Date for PT Re-Evaluation 12/18/16   PT Start Time 0903   PT Stop Time 0948   PT Time Calculation (min) 45 min   Activity Tolerance Patient tolerated treatment well   Behavior During Therapy [facility] for tasks assessed/performed      Past Medical History:  Diagnosis Date  . Anemia    diet related  . Asthma   . Atrophic vaginitis   . Colitis   . Hx of endometriosis    took Danacrine  . Hypercholesterolemia   . Osteoporosis    spine off Actonel sinsce 09/2010- Drug [place]  . Premature ovarian failure age 76   HRT age 76 - 12/2000    Past Surgical History:  Procedure Laterality Date  . HYSTEROSCOPY  1996   DUB--wnl  . NASAL SINUS SURGERY  40's and 50's  . TUBAL LIGATION Bilateral 1982    There were no vitals filed for this visit.      Subjective Assessment - 12/14/16 0902    Subjective Reports that she thinks the DN helped on Monday but reports she woke up this morning having to use heat.   Pertinent History OP.   Patient Stated Goals Get out of pain so I can exercise more.   Currently in Pain? Yes   Pain Score 7    Pain Location Neck   Pain Orientation Right   Pain Descriptors / Indicators Aching;Discomfort   Pain Type Acute pain   Pain Onset More than a month ago   Aggravating Factors  Sleeping            [facility] - 12/14/16 0001      Assessment   Medical Diagnosis Degeneration of C5-C6 intervertebral disc.     Restrictions   Weight Bearing Restrictions No                     [facility] Adult PT Treatment/Exercise - 12/14/16 0001      Modalities   Modalities Ultrasound;Moist Heat;Electrical Stimulation     Moist Heat Therapy   Number Minutes Moist Heat 15 Minutes   Moist Heat Location Cervical     Electrical Stimulation   Electrical Stimulation Location B UT   Electrical Stimulation Action Pre-Mod   Electrical Stimulation Parameters 80-150 hz x15 min   Electrical Stimulation Goals Tone;Pain     Ultrasound   Ultrasound Location B UT   Ultrasound Parameters 1.5 w/cm2, 100%, 1 mhz x10 min   Ultrasound Goals Pain     Manual Therapy   Manual Therapy Soft tissue mobilization   Soft tissue mobilization STW/MFR to B UT, scalenes and cervical paraspinals to reduce tone and pain                  PT Short Term Goals - 11/27/16 1139      PT SHORT TERM GOAL #1   Title STG's=LTG's.           PT Long Term Goals - 12/06/16 [id number]      PT LONG TERM GOAL #1   Title Independent with a  HEP.   Time 4   Period Weeks   Status On-going     PT LONG TERM GOAL #2   Title Increase active cervical rotation to 70 degrees+ so patient can turn head more easily while driving.   Time 4   Period Weeks   Status On-going     PT LONG TERM GOAL #3   Title Perform ADL's and normal exercise activites with pain not > 2-3/10.   Time 4   Period Weeks   Status On-going               Plan - 12/14/16 0944    Clinical Impression Statement Patient presented in clinic with reports of increased cervical pain following sleep. Patient has attempted using multiple types of pillows to assist with improving sleep but has not found a pillow to suit her needs as of now per patient report. Continued tightness in B UT and cervical paraspinals but especially greater tightness in R cervical musculature. Patient reported relief following manual therapy. Normal modalities response noted following removal of the modalities.   Rehab Potential Good   PT Frequency 3x / week   PT Duration 4 weeks   PT Treatment/Interventions ADLs/Self Care  Home Management;Cryotherapy;Electrical Stimulation;Moist Heat;Ultrasound;Patient/family education;Therapeutic exercise;Therapeutic activities;Manual techniques;Dry needling   PT Next Visit Plan Assess DN and goals, Continue with cervical retraction and prone neck diagonals; Combo e'stim/U/S; STW/M; gentle manual traction; cervical range of motion exercises and postural improvment exercises.  modalities prn   PT Home Exercise Plan POE neck diagonals x 10 ea, 2x/day   Consulted and Agree with Plan of Care Patient      Patient will benefit from skilled therapeutic intervention in order to improve the following deficits and impairments:  Pain, Decreased activity tolerance, Decreased range of motion  Visit Diagnosis: Cervicalgia  Abnormal posture     Problem List Patient Active Problem List   Diagnosis Date Noted  . 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 Unspecified vitamin D deficiency 08/06/2013  . Osteoporosis, unspecified 08/04/2012  . 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    Evelene Croon, PTA 12/14/2016, 9:53 AM  [facility] [address] [place], [place], [address] Phone: [phone]   Fax:  [phone]  Name: Megan Logan MRN: [id number] Date of Birth: December 10, 1940

## 2016-12-18 NOTE — Therapy (Signed)
[facility] [address] [place], [place], [address] Phone: [phone]   Fax:  [phone]  Physical Therapy Evaluation  Patient Details  Name: Megan Logan MRN: [id number] Date of Birth: 03/07/1941 Referring Provider: Sheran Luzichard Ramos MD  Encounter Date: 11/27/2016    Past Medical History:  Diagnosis Date  . Anemia    diet related  . Asthma   . Atrophic vaginitis   . Colitis   . Hx of endometriosis    took Danacrine  . Hypercholesterolemia   . Osteoporosis    spine off Actonel sinsce 09/2010- Drug [facility]  . Premature ovarian failure age 76   HRT age 76 - 12/2000    Past Surgical History:  Procedure Laterality Date  . HYSTEROSCOPY  1996   DUB--wnl  . NASAL SINUS SURGERY  40's and 50's  . TUBAL LIGATION Bilateral 1982    There were no vitals filed for this visit.               Objective measurements completed on examination: See above findings.                    PT Short Term Goals - 11/27/16 1139      PT SHORT TERM GOAL #1   Title STG's=LTG's.           PT Long Term Goals - 12/06/16 [id number]      PT LONG TERM GOAL #1   Title Independent with a HEP.   Time 4   Period Weeks   Status On-going     PT LONG TERM GOAL #2   Title Increase active cervical rotation to 70 degrees+ so patient can turn head more easily while driving.   Time 4   Period Weeks   Status On-going     PT LONG TERM GOAL #3   Title Perform ADL's and normal exercise activites with pain not > 2-3/10.   Time 4   Period Weeks   Status On-going              Patient will benefit from skilled therapeutic intervention in order to improve the following deficits and impairments:  Pain, Decreased activity tolerance, Decreased range of motion  Visit Diagnosis: Cervicalgia - Plan: PT plan of care cert/re-cert  Abnormal posture - Plan: PT plan of care cert/re-cert     Problem List Patient Active Problem  List   Diagnosis Date Noted  . 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 Unspecified vitamin D deficiency 08/06/2013  . Osteoporosis, unspecified 08/04/2012  . 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    APPLEGATE, ItalyHAD MPT 12/18/2016, 1:26 PM  [facility] [address] [place], [place], [address] Phone: [phone]   Fax:  [phone]  Name: Megan Logan MRN: [id number] Date of Birth: 03/07/1941

## 2016-12-25 ENCOUNTER — Encounter: Payer: Medicare Other | Admitting: Physical Therapy

## 2016-12-28 ENCOUNTER — Ambulatory Visit: Payer: Medicare Other | Admitting: Physical Therapy

## 2016-12-28 DIAGNOSIS — M542 Cervicalgia: Secondary | ICD-10-CM | POA: Diagnosis not present

## 2016-12-28 DIAGNOSIS — R293 Abnormal posture: Secondary | ICD-10-CM

## 2016-12-28 NOTE — Therapy (Signed)
[facility] [address] [place], [place], [address] Phone: [phone]   Fax:  [phone]  Physical Therapy Treatment  Patient Details  Name: Megan Logan MRN: [id number] Date of Birth: 1941-01-06 Referring Provider: Sheran Luz MD  Encounter Date: 12/28/2016      PT End of Session - 12/28/16 0906    Visit Number 7   Number of Visits 13   Date for PT Re-Evaluation 01/18/17   PT Start Time 0907   PT Stop Time 1011   PT Time Calculation (min) 64 min   Activity Tolerance Patient tolerated treatment well   Behavior During Therapy [facility] for tasks assessed/performed      Past Medical History:  Diagnosis Date  . Anemia    diet related  . Asthma   . Atrophic vaginitis   . Colitis   . Hx of endometriosis    took Danacrine  . Hypercholesterolemia   . Osteoporosis    spine off Actonel sinsce 09/2010- Drug Holiday  . Premature ovarian failure age 45   HRT age 40 - 12/2000    Past Surgical History:  Procedure Laterality Date  . HYSTEROSCOPY  1996   DUB--wnl  . NASAL SINUS SURGERY  40's and 50's  . TUBAL LIGATION Bilateral 1982    There were no vitals filed for this visit.      Subjective Assessment - 12/28/16 0907    Subjective Patient states that she thinks the DN helped for a 2-3 days, but it's hurting again. After the DN she had less pain and could sleep better. Her HA were better for one week. Now it is still worse at night. She awoke last night at 1:45 and then had to sit up with airplane pillow. The pain eased off.    Patient Stated Goals Get out of pain so I can exercise more.   Currently in Pain? Yes   Pain Score 5    Pain Location Neck   Pain Orientation Right   Pain Descriptors / Indicators Aching   Pain Type Acute pain   Pain Onset More than a month ago   Pain Frequency Constant   Aggravating Factors  sleeping   Pain Relieving Factors rest, moist heat   Effect of Pain on Daily Activities pain with ADLS            [facility] PT Assessment - 12/28/16 0001      AROM   Overall AROM Comments SB R 15 deg; L 20 deg; Rot R 20  L 18                     [facility] Adult PT Treatment/Exercise - 12/28/16 0001      Neck Exercises: Supine   Neck Retraction 5 reps;5 secs   Neck Retraction Limitations VCs required for correct form     Neck Exercises: Prone   Other Prone Exercise POE with neck diagonals x 10 from L elbow; from R elbow to painfl     Modalities   Modalities Electrical Stimulation;Moist Heat     Moist Heat Therapy   Number Minutes Moist Heat 15 Minutes   Moist Heat Location Cervical     Electrical Stimulation   Electrical Stimulation Location B UT/cspine   Electrical Stimulation Action IFC   Electrical Stimulation Parameters 80-150 Hz x 15 min   Electrical Stimulation Goals Pain;Tone     Manual Therapy   Manual Therapy Soft tissue mobilization;Manual Traction;Joint mobilization   Joint Mobilization in  supine PA and lateral glides gd II to C2-C6/7   Soft tissue mobilization to B cervical paraspinals and UT   Manual Traction supine 2x30 sec; seated with rotation to R x 5          Trigger Point Dry Needling - 12/28/16 0958    Consent Given? Yes   Education Handout Provided No   Muscles Treated Upper Body Sternocleidomastoid;Upper trapezius;Oblique capitus;Suboccipitals muscle group;Levator scapulae  Bil; cervcial multifidi C2-6 Bil   Sternocleidomastoid Response Twitch response elicited   Upper Trapezius Response Twitch reponse elicited;Palpable increased muscle length   Oblique Capitus Response Twitch response elicited;Palpable increased muscle length   SubOccipitals Response Twitch response elicited;Palpable increased muscle length  L> R   Levator Scapulae Response Twitch response elicited              PT Education - 12/28/16 1002    Education provided Yes   Education Details HEP; self care - discussed use of towel roll again; ADL posture   Person(s)  Educated Patient   Methods Explanation;Demonstration;Verbal cues;Handout   Comprehension Verbalized understanding;Returned demonstration          PT Short Term Goals - 11/27/16 1139      PT SHORT TERM GOAL #1   Title STG's=LTG's.           PT Long Term Goals - 12/28/16 1008      PT LONG TERM GOAL #1   Title Independent with a HEP.   Time 4   Period Weeks   Status On-going     PT LONG TERM GOAL #2   Title Increase active cervical rotation to 70 degrees+ so patient can turn head more easily while driving.   Time 4   Period Weeks   Status On-going     PT LONG TERM GOAL #3   Title Perform ADL's and normal exercise activites with pain not > 2-3/10.   Time 4   Period Weeks   Status On-going               Plan - 12/28/16 1004    Clinical Impression Statement Patient presents today after 2 week break from PT due to company visiting her. She has continued c/o neck pain R>L primarily with sleeping and looking up and down. Her ROM is still very limited in all directions with crepitis noted. She responded very well to DN today with notable increase in lateral SB and decreased pain with B rotation afterwards, however ROM still very limited. She had ++ localized twitch responses in cervical multifid Bil and reports decreased pain at end of visit today.    Rehab Potential Good   PT Frequency 3x / week   PT Duration 4 weeks   PT Treatment/Interventions ADLs/Self Care Home Management;Cryotherapy;Electrical Stimulation;Moist Heat;Ultrasound;Patient/family education;Therapeutic exercise;Therapeutic activities;Manual techniques;Dry needling   PT Next Visit Plan Assess DN and goals, Continue with cervical retraction and prone neck diagonals;  STW/M; gentle manual traction; cervical range of motion exercises and postural improvment exercises.  modalities prn   PT Home Exercise Plan pain free ROM and supine cervical retraction 2x/day; POE neck diagonals x 10 ea, 2x/day   Consulted and  Agree with Plan of Care Patient      Patient will benefit from skilled therapeutic intervention in order to improve the following deficits and impairments:  Pain, Decreased activity tolerance, Decreased range of motion  Visit Diagnosis: Cervicalgia - Plan: PT plan of care cert/re-cert  Abnormal posture - Plan: PT plan of care cert/re-cert  Problem List Patient Active Problem List   Diagnosis Date Noted  . 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 Unspecified vitamin D deficiency 08/06/2013  . Osteoporosis, unspecified 08/04/2012  . 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    Solon Palm PT 12/28/2016, 10:23 AM  [facility] [address] [place], [place], [address] Phone: [phone]   Fax:  [phone]  Name: ROWENE SUTO MRN: [id number] Date of Birth: 1941-03-13

## 2016-12-31 ENCOUNTER — Ambulatory Visit: Payer: Medicare Other | Admitting: Physical Therapy

## 2016-12-31 DIAGNOSIS — M542 Cervicalgia: Secondary | ICD-10-CM

## 2016-12-31 DIAGNOSIS — R293 Abnormal posture: Secondary | ICD-10-CM

## 2016-12-31 NOTE — Therapy (Signed)
[facility] [address] [place], [place], [address] Phone: [phone]   Fax:  [phone]  Physical Therapy Treatment  Patient Details  Name: Megan Logan MRN: [id number] Date of Birth: 05/28/1940 Referring Provider: Sheran Luz MD  Encounter Date: 12/31/2016      PT End of Session - 12/31/16 0946    Visit Number 8   Number of Visits 13   Date for PT Re-Evaluation 01/18/17   PT Start Time 0902   PT Stop Time 1000   PT Time Calculation (min) 58 min   Activity Tolerance Patient tolerated treatment well   Behavior During Therapy [facility] for tasks assessed/performed      Past Medical History:  Diagnosis Date  . Anemia    diet related  . Asthma   . Atrophic vaginitis   . Colitis   . Hx of endometriosis    took Danacrine  . Hypercholesterolemia   . Osteoporosis    spine off Actonel sinsce 09/2010- Drug Holiday  . Premature ovarian failure age 76   HRT age 76 - 12/2000    Past Surgical History:  Procedure Laterality Date  . HYSTEROSCOPY  1996   DUB--wnl  . NASAL SINUS SURGERY  40's and 50's  . TUBAL LIGATION Bilateral 1982    There were no vitals filed for this visit.      Subjective Assessment - 12/31/16 0904    Subjective Patient states her HA are gone. She was a little sore this time. Her cerv flex/ext seems better with ROM, but rotation is still the most painful. Pain is on right side with all motions, with some intermittent left side pain.   Patient Stated Goals Get out of pain so I can exercise more.   Currently in Pain? Yes   Pain Score 6    Pain Location Neck   Pain Orientation Right   Pain Descriptors / Indicators Aching   Pain Type Acute pain            [facility] PT Assessment - 12/31/16 0001      Palpation   Palpation comment tightness and tenderness in Bil SCM                     [facility] Adult PT Treatment/Exercise - 12/31/16 0001      Neck Exercises: Seated   Other Seated Exercise  spinal dissociation (upper and lower cervical) into flexion and extension x 10 ea.     Neck Exercises: Supine   Cervical Isometrics Extension;10 secs;20 reps  towel roll under neck/no pillow   Capital Flexion 10 reps;10 secs   Other Supine Exercise thoracic extension over towel roll and arms overhead     Neck Exercises: Prone   Other Prone Exercise quadriped capital flexion 10x10 sec; rotaton upper cervical x 10 Bil     Modalities   Modalities Electrical Stimulation;Moist Heat     Moist Heat Therapy   Number Minutes Moist Heat 15 Minutes   Moist Heat Location Cervical     Electrical Stimulation   Electrical Stimulation Location B cspine and UT   Electrical Stimulation Action IFC    Electrical Stimulation Parameters 80-150 Hz x 15 min   Electrical Stimulation Goals Tone;Pain                PT Education - 12/31/16 1414    Education provided Yes   Education Details HEP   Person(s) Educated Patient   Methods Explanation;Demonstration;Tactile cues;Verbal cues;Handout   Comprehension  Verbalized understanding;Returned demonstration          PT Short Term Goals - 11/27/16 1139      PT SHORT TERM GOAL #1   Title STG's=LTG's.           PT Long Term Goals - 12/28/16 1008      PT LONG TERM GOAL #1   Title Independent with a HEP.   Time 4   Period Weeks   Status On-going     PT LONG TERM GOAL #2   Title Increase active cervical rotation to 70 degrees+ so patient can turn head more easily while driving.   Time 4   Period Weeks   Status On-going     PT LONG TERM GOAL #3   Title Perform ADL's and normal exercise activites with pain not > 2-3/10.   Time 4   Period Weeks   Status On-going               Plan - 12/31/16 1417    Clinical Impression Statement Patient reports some improvement since last visit. She has had no headaches and her pain is somewhat better. The towel roll helped some with sleeping. We worked on strengthening the cervical  stabilizers today which pt did well with, but she does require VCs and TCs for correct form. Patient demo'd improved cervical flex/ext in a pain free range today at end of treatment. Rotation is still most painful.    PT Treatment/Interventions ADLs/Self Care Home Management;Cryotherapy;Electrical Stimulation;Moist Heat;Ultrasound;Patient/family education;Therapeutic exercise;Therapeutic activities;Manual techniques;Dry needling   PT Next Visit Plan Assess goals. Continue with painfree cervical stabilization;  STW/M; gentle manual traction; cervical range of motion exercises and postural improvment exercises.  modalities prn; possibly DN to B SCM/scalenes   PT Home Exercise Plan supine chin nod (capital flexion), extension isometric submax contraction with no ant neck compensation; pain free ROM and supine cervical retraction 2x/day; POE neck diagonals x 10 ea, 2x/day      Patient will benefit from skilled therapeutic intervention in order to improve the following deficits and impairments:  Pain, Decreased activity tolerance, Decreased range of motion  Visit Diagnosis: Cervicalgia  Abnormal posture     Problem List Patient Active Problem List   Diagnosis Date Noted  . 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 Unspecified vitamin D deficiency 08/06/2013  . Osteoporosis, unspecified 08/04/2012  . 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    Solon Palm PT 12/31/2016, 2:22 PM  [facility] [address] [place], [place], [address] Phone: [phone]   Fax:  [phone]  Name: Megan Logan MRN: [id number] Date of Birth: 10-09-1940

## 2017-01-03 ENCOUNTER — Ambulatory Visit: Payer: Medicare Other | Admitting: *Deleted

## 2017-01-03 DIAGNOSIS — M542 Cervicalgia: Secondary | ICD-10-CM

## 2017-01-03 DIAGNOSIS — R293 Abnormal posture: Secondary | ICD-10-CM

## 2017-01-03 NOTE — Therapy (Signed)
[facility] [address] [place], [place], [address] Phone: [phone]   Fax:  [phone]  Physical Therapy Treatment  Patient Details  Name: Megan SaxBonnie M Logan MRN: [id number] Date of Birth: 1941/03/27 Referring Provider: Sheran Luzichard Ramos MD  Encounter Date: 01/03/2017      PT End of Session - 01/03/17 0820    Visit Number 9   Number of Visits 13   Date for PT Re-Evaluation 01/18/17   PT Start Time 0818   PT Stop Time 0907   PT Time Calculation (min) 49 min      Past Medical History:  Diagnosis Date  . Anemia    diet related  . Asthma   . Atrophic vaginitis   . Colitis   . Hx of endometriosis    took Danacrine  . Hypercholesterolemia   . Osteoporosis    spine off Actonel sinsce 09/2010- Drug [place]  . Premature ovarian failure age [age]   HRT age 76 - 12/2000    Past Surgical History:  Procedure Laterality Date  . HYSTEROSCOPY  1996   DUB--wnl  . NASAL SINUS SURGERY  40's and 50's  . TUBAL LIGATION Bilateral 1982    There were no vitals filed for this visit.      Subjective Assessment - 01/03/17 0818    Subjective I was sore after last Rx , but better today. My main problem is sleeping.   Pertinent History OP.   Patient Stated Goals Get out of pain so I can exercise more.   Currently in Pain? Yes   Pain Score 6    Pain Location Neck   Pain Orientation Right   Pain Descriptors / Indicators Aching   Pain Type Acute pain   Pain Onset More than a month ago   Aggravating Factors  sleeping                         [facility] Adult PT Treatment/Exercise - 01/03/17 0001      Modalities   Modalities Electrical Stimulation;Moist Heat     Moist Heat Therapy   Number Minutes Moist Heat 15 Minutes   Moist Heat Location Cervical     Electrical Stimulation   Electrical Stimulation Location B cspine and UT  IFC x 15 mins 80-150hz    Electrical Stimulation Goals Tone;Pain     Ultrasound   Ultrasound  Location RT/LT 1.5 w/cm2 x12 mins   Ultrasound Goals Pain     Manual Therapy   Manual Therapy Soft tissue mobilization;Manual Traction;Joint mobilization   Myofascial Release Rt upper trap, levator scapula and c-spine paraspinals, STW and IASTM                  PT Short Term Goals - 11/27/16 1139      PT SHORT TERM GOAL #1   Title STG's=LTG's.           PT Long Term Goals - 12/28/16 1008      PT LONG TERM GOAL #1   Title Independent with a HEP.   Time 4   Period Weeks   Status On-going     PT LONG TERM GOAL #2   Title Increase active cervical rotation to 70 degrees+ so patient can turn head more easily while driving.   Time 4   Period Weeks   Status On-going     PT LONG TERM GOAL #3   Title Perform ADL's and normal exercise activites with pain not > 2-3/10.  Time 4   Period Weeks   Status On-going               Plan - 01/03/17 2130    Clinical Impression Statement Pt arrived today doing fairly well, but pain remains about the same. Rx focused on Bil cerv paras and UT tightness/soreness. She had good relief of pain , but ROM continues to be limited especially Rot to RT   Clinical Decision Making Low   Rehab Potential Good   PT Frequency 3x / week   PT Duration 4 weeks   PT Treatment/Interventions ADLs/Self Care Home Management;Cryotherapy;Electrical Stimulation;Moist Heat;Ultrasound;Patient/family education;Therapeutic exercise;Therapeutic activities;Manual techniques;Dry needling   PT Next Visit Plan Assess goals. Continue with painfree cervical stabilization;  STW/M; gentle manual traction; cervical range of motion exercises and postural improvment exercises.  modalities prn; possibly DN to B SCM/scalenes   PT Home Exercise Plan supine chin nod (capital flexion), extension isometric submax contraction with no ant neck compensation; pain free ROM and supine cervical retraction 2x/day; POE neck diagonals x 10 ea, 2x/day   Consulted and Agree with  Plan of Care Patient      Patient will benefit from skilled therapeutic intervention in order to improve the following deficits and impairments:  Pain, Decreased activity tolerance, Decreased range of motion  Visit Diagnosis: Cervicalgia  Abnormal posture     Problem List Patient Active Problem List   Diagnosis Date Noted  . 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 Unspecified vitamin D deficiency 08/06/2013  . Osteoporosis, unspecified 08/04/2012  . 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    Megan Logan,CHRIS, PTA 01/03/2017, 9:29 AM  [facility] [address] [place], [place], [address] Phone: [phone]   Fax:  [phone]  Name: Megan Logan MRN: [id number] Date of Birth: 1940-10-16

## 2017-01-08 ENCOUNTER — Ambulatory Visit: Payer: Medicare Other | Attending: Physical Medicine and Rehabilitation | Admitting: Physical Therapy

## 2017-01-08 DIAGNOSIS — R293 Abnormal posture: Secondary | ICD-10-CM | POA: Diagnosis present

## 2017-01-08 DIAGNOSIS — M542 Cervicalgia: Secondary | ICD-10-CM | POA: Insufficient documentation

## 2017-01-08 NOTE — Therapy (Addendum)
[facility] [address] [place], [place], [address] Phone: [phone]   Fax:  [phone]  Physical Therapy Treatment  Patient Details  Name: Megan Logan MRN: [id number] Date of Birth: 07-02-1940 Referring Provider: Sheran Luz MD  Encounter Date: 01/08/2017      PT End of Session - 01/08/17 1354    Visit Number 10   Number of Visits 13   Date for PT Re-Evaluation 01/18/17   PT Start Time 1355   PT Stop Time 1446   PT Time Calculation (min) 51 min   Activity Tolerance Patient tolerated treatment well   Behavior During Therapy [facility] for tasks assessed/performed      Past Medical History:  Diagnosis Date  . Anemia    diet related  . Asthma   . Atrophic vaginitis   . Colitis   . Hx of endometriosis    took Danacrine  . Hypercholesterolemia   . Osteoporosis    spine off Actonel sinsce 09/2010- Drug Holiday  . Premature ovarian failure age 54   HRT age 28 - 12/2000    Past Surgical History:  Procedure Laterality Date  . HYSTEROSCOPY  1996   DUB--wnl  . NASAL SINUS SURGERY  40's and 50's  . TUBAL LIGATION Bilateral 1982    There were no vitals filed for this visit.      Subjective Assessment - 01/08/17 1358    Subjective Patient has been sleeping on her back and she has gotten 6 hours of sleep the past two nights. Looking down is the worst. Had no HA for two weeks until this past Saturday.   Pertinent History OP.   Patient Stated Goals Get out of pain so I can exercise more.   Currently in Pain? Yes   Pain Score 6    Pain Location Neck   Pain Orientation Right   Pain Descriptors / Indicators Sharp;Aching   Pain Type Acute pain   Pain Onset More than a month ago   Pain Frequency Intermittent   Aggravating Factors  looking down, turning right   Pain Relieving Factors rest, no movement, moist heat   Effect of Pain on Daily Activities pain with ADLs            [facility] PT Assessment - 01/08/17 0001      Observation/Other Assessments   Focus on Therapeutic Outcomes (FOTO)  49% limited                     [facility] Adult PT Treatment/Exercise - 01/08/17 0001      Self-Care   Self-Care Other Self-Care Comments   Other Self-Care Comments  self TPR to B scalenes     Neck Exercises: Standing   Neck Retraction 10 reps   Neck Retraction Limitations with neck flexion 2x5     Modalities   Modalities Electrical Stimulation;Moist Heat     Moist Heat Therapy   Number Minutes Moist Heat 15 Minutes   Moist Heat Location Cervical     Electrical Stimulation   Electrical Stimulation Location B cspine and UT  IFC x 15 mins 80-150hz    Electrical Stimulation Goals Tone;Pain     Manual Therapy   Manual Therapy Soft tissue mobilization   Soft tissue mobilization to B UT and cspine in prone and to B scalenes in supine          Trigger Point Dry Needling - 01/08/17 1537    Consent Given? Yes   Education Handout Provided  No   Muscles Treated Upper Body Upper trapezius;Suboccipitals muscle group  Bil, C3 mulitifdi R (no response)   Upper Trapezius Response Twitch reponse elicited;Palpable increased muscle length   SubOccipitals Response Twitch response elicited;Palpable increased muscle length              PT Education - January 20, 2017 1540    Education provided Yes   Education Details HEP: cerv retraction with flexion and self TPR to scalenes. Attempted retraction with rotation but too painful.   Person(s) Educated Patient   Methods Explanation;Demonstration;Tactile cues;Verbal cues   Comprehension Verbalized understanding;Returned demonstration          PT Short Term Goals - 11/27/16 1139      PT SHORT TERM GOAL #1   Title STG's=LTG's.           PT Long Term Goals - 12/28/16 1008      PT LONG TERM GOAL #1   Title Independent with a HEP.   Time 4   Period Weeks   Status On-going     PT LONG TERM GOAL #2   Title Increase active cervical rotation to 70 degrees+  so patient can turn head more easily while driving.   Time 4   Period Weeks   Status On-going     PT LONG TERM GOAL #3   Title Perform ADL's and normal exercise activites with pain not > 2-3/10.   Time 4   Period Weeks   Status On-going               Plan - January 20, 2017 1541    Clinical Impression Statement Patient presents today with minimal improvement with ROM and pain although HA have improved significantly. She has marked tightness in her scalenes R > L, and in subocippitals today. Her UT and cervical paraspinals felt much improved.  Minimal response to DN today except in subocciptials. LTGs are ongoing.   Rehab Potential Good   PT Frequency 3x / week   PT Duration 4 weeks   PT Treatment/Interventions ADLs/Self Care Home Management;Cryotherapy;Electrical Stimulation;Moist Heat;Ultrasound;Patient/family education;Therapeutic exercise;Therapeutic activities;Manual techniques;Dry needling   PT Next Visit Plan 3 more visits then refer back to MD if no improvement. Continue with painfree cervical stabilization;  STW/M; gentle manual traction; cervical range of motion exercises and postural improvment exercises.  modalities prn; possibly DN to B SCM/scalenes   PT Home Exercise Plan supine chin nod (capital flexion), extension isometric submax contraction with no ant neck compensation; pain free ROM and supine cervical retraction 2x/day; POE neck diagonals x 10 ea, 2x/day   Consulted and Agree with Plan of Care Patient      Patient will benefit from skilled therapeutic intervention in order to improve the following deficits and impairments:  Pain, Decreased activity tolerance, Decreased range of motion  Visit Diagnosis: Cervicalgia  Abnormal posture       G-Codes - 2017/01/20 1621    Functional Assessment Tool Used (Outpatient Only) FOTO...49% limitation.   Functional Limitation Self care   Self Care Current Status [phone]) At least 40 percent but less than 60 percent impaired,  limited or restricted   Self Care Goal Status ([id number]) At least 40 percent but less than 60 percent impaired, limited or restricted      Problem List Patient Active Problem List   Diagnosis Date Noted  . 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 Unspecified vitamin D deficiency 08/06/2013  . Osteoporosis, unspecified 08/04/2012  . 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    Solon Palm PT 2017-01-20,  4:22 PM  [facility] [address] [place], [place], [address] Phone: [phone]   Fax:  [phone]  Name: Megan Logan MRN: [id number] Date of Birth: Mar 09, 1941

## 2017-01-14 ENCOUNTER — Ambulatory Visit: Payer: Medicare Other | Admitting: Physical Therapy

## 2017-01-14 DIAGNOSIS — M542 Cervicalgia: Secondary | ICD-10-CM | POA: Diagnosis not present

## 2017-01-14 NOTE — Therapy (Signed)
[facility] [address] [place], [place], [address] Phone: [phone]   Fax:  [phone]  Physical Therapy Treatment  Patient Details  Name: Megan Logan MRN: [id number] Date of Birth: 1940/10/17 Referring Provider: Sheran Luz MD  Encounter Date: 01/14/2017      PT End of Session - 01/14/17 1034    Visit Number 11   Number of Visits 13   Date for PT Re-Evaluation 01/18/17   PT Start Time 1034   PT Stop Time 1121   PT Time Calculation (min) 47 min   Activity Tolerance Patient tolerated treatment well   Behavior During Therapy [facility] for tasks assessed/performed      Past Medical History:  Diagnosis Date  . Anemia    diet related  . Asthma   . Atrophic vaginitis   . Colitis   . Hx of endometriosis    took Danacrine  . Hypercholesterolemia   . Osteoporosis    spine off Actonel sinsce 09/2010- Drug Holiday  . Premature ovarian failure age 9   HRT age 7 - 12/2000    Past Surgical History:  Procedure Laterality Date  . HYSTEROSCOPY  1996   DUB--wnl  . NASAL SINUS SURGERY  40's and 50's  . TUBAL LIGATION Bilateral 1982    There were no vitals filed for this visit.      Subjective Assessment - 01/14/17 1034    Subjective There are times I think I'm better and then there are times I am not. Awoke this morning at 3 am in pain. No pain unless she looks down or turns to the right. Overall she has been better at night with towel roll. She awakes at least once per night with pain.   Pertinent History OP.   Patient Stated Goals Get out of pain so I can exercise more.   Currently in Pain? Yes   Pain Score 5    Pain Location Neck   Pain Orientation Right   Pain Descriptors / Indicators Sharp;Aching   Pain Type Acute pain   Pain Onset More than a month ago   Pain Frequency Intermittent   Aggravating Factors  looking down, turning right   Pain Relieving Factors rest, no movement   Effect of Pain on Daily Activities  pain with ADLS                         [facility] Adult PT Treatment/Exercise - 01/14/17 0001      Self-Care   Self-Care Other Self-Care Comments   Other Self-Care Comments  TENs unit education; precautions/contraindications reviewed; pt able to don/doff unit I     Programme researcher, broadcasting/film/video Location R cspine and UT   Electrical Stimulation Action premod   Electrical Stimulation Parameters 150 Hz cont   Electrical Stimulation Goals Pain;Tone     Manual Therapy   Manual Therapy Soft tissue mobilization;Myofascial release   Manual therapy comments passive stretching to R neck within tolerable ROM   Soft tissue mobilization to R scalenes and UT   Myofascial Release TPR to R scalenes and UT                PT Education - 01/14/17 1228    Education provided Yes   Education Details see self care   Person(s) Educated Patient   Methods Explanation;Demonstration;Verbal cues   Comprehension Verbalized understanding;Returned demonstration          PT Short Term Goals -  11/27/16 1139      PT SHORT TERM GOAL #1   Title STG's=LTG's.           PT Long Term Goals - 12/28/16 1008      PT LONG TERM GOAL #1   Title Independent with a HEP.   Time 4   Period Weeks   Status On-going     PT LONG TERM GOAL #2   Title Increase active cervical rotation to 70 degrees+ so patient can turn head more easily while driving.   Time 4   Period Weeks   Status On-going     PT LONG TERM GOAL #3   Title Perform ADL's and normal exercise activites with pain not > 2-3/10.   Time 4   Period Weeks   Status On-going               Plan - 01/14/17 1229    Clinical Impression Statement Patient presents today with essentially same level of pain. She reports some improvement overall, but her ROM is still significantly limited Bil to approximately 30 deg rotation. She has marked tightness in R scalenes and UT today and responded well to manual therapy.  She was educated on use of her TENS unit which she brought in with her and was I in it's use at end of treatment. She plans to contact Dr. Ethelene Halamos for f/u.   PT Treatment/Interventions ADLs/Self Care Home Management;Cryotherapy;Electrical Stimulation;Moist Heat;Ultrasound;Patient/family education;Therapeutic exercise;Therapeutic activities;Manual techniques;Dry needling   PT Next Visit Plan D/C to HEP; Refer back to MD due to lack of progress. Continue with painfree cervical stabilization;  STW/M; gentle manual traction; cervical range of motion exercises and postural improvment exercises.    PT Home Exercise Plan supine chin nod (capital flexion), extension isometric submax contraction with no ant neck compensation; pain free ROM and supine cervical retraction 2x/day; POE neck diagonals x 10 ea, 2x/day      Patient will benefit from skilled therapeutic intervention in order to improve the following deficits and impairments:  Pain, Decreased activity tolerance, Decreased range of motion  Visit Diagnosis: Cervicalgia     Problem List Patient Active Problem List   Diagnosis Date Noted  . 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 Unspecified vitamin D deficiency 08/06/2013  . Osteoporosis, unspecified 08/04/2012  . 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    Solon PalmJulie Shavonta Gossen PT 01/14/2017, 12:35 PM  [facility] [address] [place], [place], [address] Phone: [phone]   Fax:  [phone]  Name: Megan Logan MRN: [id number] Date of Birth: [birth year]

## 2017-01-17 ENCOUNTER — Ambulatory Visit: Payer: Medicare Other | Admitting: *Deleted

## 2017-01-17 DIAGNOSIS — M542 Cervicalgia: Secondary | ICD-10-CM | POA: Diagnosis not present

## 2017-01-17 DIAGNOSIS — R293 Abnormal posture: Secondary | ICD-10-CM

## 2017-01-17 NOTE — Therapy (Signed)
[facility] [place], [place], [address] Phone: [phone]   Fax:  [phone]  Physical Therapy Treatment  Patient Details  Name: SHAWANA KNOCH MRN: [id number] Date of Birth: 12/12/1940 Referring Provider: Suella Broad MD  Encounter Date: 01/17/2017      PT End of Session - 01/17/17 0852    Visit Number 12   Number of Visits 13   Date for PT Re-Evaluation 01/18/17   PT Start Time 0815   PT Stop Time 0905   PT Time Calculation (min) 50 min      Past Medical History:  Diagnosis Date  . Anemia    diet related  . Asthma   . Atrophic vaginitis   . Colitis   . Hx of endometriosis    took Danacrine  . Hypercholesterolemia   . Osteoporosis    spine off Actonel sinsce 09/2010- Drug [facility]  . Premature ovarian failure age 73   HRT age 58 - 12/2000    Past Surgical History:  Procedure Laterality Date  . HYSTEROSCOPY  1996   DUB--wnl  . NASAL SINUS SURGERY  40's and 50's  . TUBAL LIGATION Bilateral 1982    There were no vitals filed for this visit.      Subjective Assessment - 01/17/17 0817    Subjective . No pain unless she looks down or turns to the right. Overall she has been better at night with towel roll. She awakes at least once per night with pain.   Pertinent History OP.   Patient Stated Goals Get out of pain so I can exercise more.   Currently in Pain? Yes   Pain Score 6    Pain Location Neck   Pain Orientation Right   Pain Descriptors / Indicators Sore;Sharp   Pain Type Acute pain   Pain Onset More than a month ago                         [facility] Adult PT Treatment/Exercise - 01/17/17 0001      Modalities   Modalities Electrical Stimulation;Moist Heat     Moist Heat Therapy   Number Minutes Moist Heat 15 Minutes   Moist Heat Location Cervical     Electrical Stimulation   Electrical Stimulation Location B cspine and UT  IFC x 15 mins 80-150hz    Electrical Stimulation  Goals Pain;Tone     Ultrasound   Ultrasound Location Bil cerv paras    Ultrasound Parameters 1.5 w/cm2 x 12 mins   Ultrasound Goals Pain     Manual Therapy   Manual Therapy Soft tissue mobilization;Myofascial release   Soft tissue mobilization to R scalenes and UT                  PT Short Term Goals - 11/27/16 1139      PT SHORT TERM GOAL #1   Title STG's=LTG's.           PT Long Term Goals - 01/17/17 0818      PT LONG TERM GOAL #1   Title Independent with a HEP.   Time 4   Period Weeks   Status Achieved     PT LONG TERM GOAL #2   Title Increase active cervical rotation to 70 degrees+ so patient can turn head more easily while driving.   Time 4   Period Weeks   Status Not Met     PT LONG TERM GOAL #3  Title Perform ADL's and normal exercise activites with pain not > 2-3/10.   Time 4   Period Weeks   Status Not Met               Plan - 01/17/17 0859    Clinical Impression Statement Pt arrived to clinic doing about the same with neck pain and cervical       Patient will benefit from skilled therapeutic intervention in order to improve the following deficits and impairments:     Visit Diagnosis: Cervicalgia  Abnormal posture     Problem List Patient Active Problem List   Diagnosis Date Noted  . 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 Unspecified vitamin D deficiency 08/06/2013  . Osteoporosis, unspecified 08/04/2012  . 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    RAMSEUR,CHRIS 01/17/2017, 1:00 PM  [facility] [place], [place], [address] Phone: [phone]   Fax:  [phone]  Name: VY BADLEY MRN: [id number] Date of Birth: 25-Apr-1941

## 2017-01-17 NOTE — Therapy (Addendum)
[facility] [place], [place], [address] Phone: [phone]   Fax:  [phone]  Physical Therapy Treatment  Patient Details  Name: Megan Logan MRN: [id number] Date of Birth: Sep 23, 1940 Referring Provider: Suella Broad MD  Encounter Date: 01/17/2017      PT End of Session - 01/17/17 0852    Visit Number 12   Number of Visits 13   Date for PT Re-Evaluation 01/18/17   PT Start Time 0815   PT Stop Time 0905   PT Time Calculation (min) 50 min      Past Medical History:  Diagnosis Date  . Anemia    diet related  . Asthma   . Atrophic vaginitis   . Colitis   . Hx of endometriosis    took Danacrine  . Hypercholesterolemia   . Osteoporosis    spine off Actonel sinsce 09/2010- Drug [facility]  . Premature ovarian failure age 54   HRT age 62 - 12/2000    Past Surgical History:  Procedure Laterality Date  . HYSTEROSCOPY  1996   DUB--wnl  . NASAL SINUS SURGERY  40's and 50's  . TUBAL LIGATION Bilateral 1982    There were no vitals filed for this visit.      Subjective Assessment - 01/17/17 0817    Subjective . No pain unless she looks down or turns to the right. Overall she has been better at night with towel roll. She awakes at least once per night with pain. DC today   Pertinent History OP.   Patient Stated Goals Get out of pain so I can exercise more.   Currently in Pain? Yes   Pain Score 6    Pain Location Neck   Pain Orientation Right   Pain Descriptors / Indicators Sore;Sharp   Pain Type Acute pain   Pain Onset More than a month ago                         [facility] Adult PT Treatment/Exercise - 01/17/17 0001      Modalities   Modalities Electrical Stimulation;Moist Heat     Moist Heat Therapy   Number Minutes Moist Heat 15 Minutes   Moist Heat Location Cervical     Electrical Stimulation   Electrical Stimulation Location B cspine and UT  IFC x 15 mins 80-150hz    Electrical  Stimulation Goals Pain;Tone     Ultrasound   Ultrasound Location Bil cerv paras    Ultrasound Parameters 1.5 w/cm2 x 12 mins   Ultrasound Goals Pain     Manual Therapy   Manual Therapy Soft tissue mobilization;Myofascial release   Soft tissue mobilization to R scalenes and UT                  PT Short Term Goals - 11/27/16 1139      PT SHORT TERM GOAL #1   Title STG's=LTG's.           PT Long Term Goals - 01/17/17 0818      PT LONG TERM GOAL #1   Title Independent with a HEP.   Time 4   Period Weeks   Status Achieved     PT LONG TERM GOAL #2   Title Increase active cervical rotation to 70 degrees+ so patient can turn head more easily while driving.  only 30 degrees Rotation   Time 4   Period Weeks   Status Not Met  PT LONG TERM GOAL #3   Title Perform ADL's and normal exercise activites with pain not > 2-3/10.  NM due to pain 4-6/10   Time 4   Period Weeks   Status Not Met               Plan - 01/17/17 0859    Clinical Impression Statement Pt arrived to clinic doing about the same with neck pain and cervical ROM. She did fairly well with Rx with decreased tension/soreness, but overall is only 10-20% better. She was only able to mmet 1/3 LTGs due to pain and ROM deficits. She is to call Dr Nelva Bush for follow-up. FOTO 59% limitation   Rehab Potential Good   PT Frequency 3x / week   PT Duration 4 weeks   PT Treatment/Interventions ADLs/Self Care Home Management;Cryotherapy;Electrical Stimulation;Moist Heat;Ultrasound;Patient/family education;Therapeutic exercise;Therapeutic activities;Manual techniques;Dry needling   PT Next Visit Plan DC to HEP   Consulted and Agree with Plan of Care Patient      Patient will benefit from skilled therapeutic intervention in order to improve the following deficits and impairments:  Pain, Decreased activity tolerance, Decreased range of motion  Visit Diagnosis: Cervicalgia  Abnormal posture     Problem  List Patient Active Problem List   Diagnosis Date Noted  . 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 Unspecified vitamin D deficiency 08/06/2013  . Osteoporosis, unspecified 08/04/2012  . 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    Emberleigh Reily,CHRIS PTA 01/17/2017, 2:31 PM  [facility] [address] [place], [place], [address] Phone: [phone]   Fax:  [phone]  Name: Megan Logan MRN: [id number] Date of Birth: 04-06-1941  PHYSICAL THERAPY DISCHARGE SUMMARY  Visits from Start of Care: 12.  Current functional level related to goals / functional outcomes: See above.   Remaining deficits: Patient showed good improvement but continued to have neck pain and limited ROM.   Education / Equipment: HEP. Plan: Patient agrees to discharge.  Patient goals were partially met. Patient is being discharged due to not returning since the last visit.  ?????         Mali Applegate MPT

## 2017-01-17 NOTE — Therapy (Signed)
[facility] [place], [place], [address] Phone: [phone]   Fax:  [phone]  Physical Therapy Treatment  Patient Details  Name: Megan Logan MRN: [id number] Date of Birth: Sep 02, 1940 Referring Provider: Suella Broad MD  Encounter Date: 01/17/2017      PT End of Session - 01/17/17 0852    Visit Number 12   Number of Visits 13   Date for PT Re-Evaluation 01/18/17   PT Start Time 0815   PT Stop Time 0905   PT Time Calculation (min) 50 min      Past Medical History:  Diagnosis Date  . Anemia    diet related  . Asthma   . Atrophic vaginitis   . Colitis   . Hx of endometriosis    took Danacrine  . Hypercholesterolemia   . Osteoporosis    spine off Actonel sinsce 09/2010- Drug [facility]  . Premature ovarian failure age 68   HRT age 61 - 12/2000    Past Surgical History:  Procedure Laterality Date  . HYSTEROSCOPY  1996   DUB--wnl  . NASAL SINUS SURGERY  40's and 50's  . TUBAL LIGATION Bilateral 1982    There were no vitals filed for this visit.      Subjective Assessment - 01/17/17 0817    Subjective . No pain unless she looks down or turns to the right. Overall she has been better at night with towel roll. She awakes at least once per night with pain. DC today   Pertinent History OP.   Patient Stated Goals Get out of pain so I can exercise more.   Currently in Pain? Yes   Pain Score 6    Pain Location Neck   Pain Orientation Right   Pain Descriptors / Indicators Sore;Sharp   Pain Type Acute pain   Pain Onset More than a month ago                         [facility] Adult PT Treatment/Exercise - 01/17/17 0001      Modalities   Modalities Electrical Stimulation;Moist Heat     Moist Heat Therapy   Number Minutes Moist Heat 15 Minutes   Moist Heat Location Cervical     Electrical Stimulation   Electrical Stimulation Location B cspine and UT  IFC x 15 mins 80-150hz    Electrical  Stimulation Goals Pain;Tone     Ultrasound   Ultrasound Location Bil cerv paras    Ultrasound Parameters 1.5 w/cm2 x 12 mins   Ultrasound Goals Pain     Manual Therapy   Manual Therapy Soft tissue mobilization;Myofascial release   Soft tissue mobilization to R scalenes and UT                  PT Short Term Goals - 11/27/16 1139      PT SHORT TERM GOAL #1   Title STG's=LTG's.           PT Long Term Goals - 01/17/17 0818      PT LONG TERM GOAL #1   Title Independent with a HEP.   Time 4   Period Weeks   Status Achieved     PT LONG TERM GOAL #2   Title Increase active cervical rotation to 70 degrees+ so patient can turn head more easily while driving.  only 30 degrees Rotation   Time 4   Period Weeks   Status Not Met  PT LONG TERM GOAL #3   Title Perform ADL's and normal exercise activites with pain not > 2-3/10.  NM due to pain 4-6/10   Time 4   Period Weeks   Status Not Met               Plan - 01/17/17 0859    Clinical Impression Statement Pt arrived to clinic doing about the same with neck pain and cervical ROM. She did fairly well with Rx with decreased tension/soreness, but overall is only 10-20% better. She was only able to mmet 1/3 LTGs due to pain and ROM deficits. She is to call Dr Nelva Bush for follow-up. FOTO 59% limitation   Rehab Potential Good   PT Frequency 3x / week   PT Duration 4 weeks   PT Treatment/Interventions ADLs/Self Care Home Management;Cryotherapy;Electrical Stimulation;Moist Heat;Ultrasound;Patient/family education;Therapeutic exercise;Therapeutic activities;Manual techniques;Dry needling   PT Next Visit Plan DC to HEP   Consulted and Agree with Plan of Care Patient      Patient will benefit from skilled therapeutic intervention in order to improve the following deficits and impairments:  Pain, Decreased activity tolerance, Decreased range of motion  Visit Diagnosis: Cervicalgia  Abnormal posture     Problem  List Patient Active Problem List   Diagnosis Date Noted  . 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 Unspecified vitamin D deficiency 08/06/2013  . Osteoporosis, unspecified 08/04/2012  . 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    Ayriel Texidor,CHRIS, PTA 01/17/2017, 2:31 PM  [facility] [place], [place], [address] Phone: [phone]   Fax:  [phone]  Name: Megan Logan MRN: [id number] Date of Birth: 1941-03-13

## 2017-01-25 ENCOUNTER — Other Ambulatory Visit: Payer: Self-pay | Admitting: Physical Medicine and Rehabilitation

## 2017-01-25 DIAGNOSIS — M50322 Other cervical disc degeneration at C5-C6 level: Secondary | ICD-10-CM

## 2017-02-05 ENCOUNTER — Ambulatory Visit
Admission: RE | Admit: 2017-02-05 | Discharge: 2017-02-05 | Disposition: A | Discharge status: Home / Self Care | Payer: Medicare Other | Source: Ambulatory Visit | Attending: Physical Medicine and Rehabilitation | Admitting: Physical Medicine and Rehabilitation

## 2017-02-05 DIAGNOSIS — M50322 Other cervical disc degeneration at C5-C6 level: Secondary | ICD-10-CM

## 2017-09-09 ENCOUNTER — Other Ambulatory Visit: Payer: Self-pay

## 2017-09-09 ENCOUNTER — Ambulatory Visit (INDEPENDENT_AMBULATORY_CARE_PROVIDER_SITE_OTHER): Payer: Medicare Other | Admitting: Obstetrics and Gynecology

## 2017-09-09 ENCOUNTER — Telehealth: Payer: Self-pay | Admitting: *Deleted

## 2017-09-09 ENCOUNTER — Ambulatory Visit: Payer: Medicare Other | Admitting: Nurse Practitioner

## 2017-09-09 ENCOUNTER — Encounter: Payer: Self-pay | Admitting: Obstetrics and Gynecology

## 2017-09-09 VITALS — BP 114/60 | HR 78 | Resp 14 | Ht 61.0 in | Wt 115.0 lb

## 2017-09-09 DIAGNOSIS — N393 Stress incontinence (female) (male): Secondary | ICD-10-CM | POA: Diagnosis not present

## 2017-09-09 DIAGNOSIS — Z01419 Encounter for gynecological examination (general) (routine) without abnormal findings: Secondary | ICD-10-CM | POA: Diagnosis not present

## 2017-09-09 DIAGNOSIS — N952 Postmenopausal atrophic vaginitis: Secondary | ICD-10-CM

## 2017-09-09 DIAGNOSIS — M8580 Other specified disorders of bone density and structure, unspecified site: Secondary | ICD-10-CM

## 2017-09-09 MED ORDER — NONFORMULARY OR COMPOUNDED ITEM: 3 refills | Status: DC

## 2017-09-09 NOTE — Patient Instructions (Signed)
EXERCISE AND DIET:  We recommended that you start or continue a regular exercise program for good health. Regular exercise means any activity that makes your heart beat faster and makes you sweat.  We recommend exercising at least 30 minutes per day at least 3 days a week, preferably 4 or 5.  We also recommend a diet low in fat and sugar.  Inactivity, poor dietary choices and obesity can cause diabetes, heart attack, stroke, and kidney damage, among others.    ALCOHOL AND SMOKING:  Women should limit their alcohol intake to no more than 7 drinks/beers/glasses of wine (combined, not each!) per week. Moderation of alcohol intake to this level decreases your risk of breast cancer and liver damage. And of course, no recreational drugs are part of a healthy lifestyle.  And absolutely no smoking or even second hand smoke. Most people know smoking can cause heart and lung diseases, but did you know it also contributes to weakening of your bones? Aging of your skin?  Yellowing of your teeth and nails?  CALCIUM AND VITAMIN D:  Adequate intake of calcium and Vitamin D are recommended.  The recommendations for exact amounts of these supplements seem to change often, but generally speaking 600 mg of calcium (either carbonate or citrate) and 800 units of Vitamin D per day seems prudent. Certain women may benefit from higher intake of Vitamin D.  If you are among these women, your doctor will have told you during your visit.    PAP SMEARS:  Pap smears, to check for cervical cancer or precancers,  have traditionally been done yearly, although recent scientific advances have shown that most women can have pap smears less often.  However, every woman still should have a physical exam from her gynecologist every year. It will include a breast check, inspection of the vulva and vagina to check for abnormal growths or skin changes, a visual exam of the cervix, and then an exam to evaluate the size and shape of the uterus and  ovaries.  And after 75 years of age, a rectal exam is indicated to check for rectal cancers. We will also provide age appropriate advice regarding health maintenance, like when you should have certain vaccines, screening for sexually transmitted diseases, bone density testing, colonoscopy, mammograms, etc.   MAMMOGRAMS:  All women over 40 years old should have a yearly mammogram. Many facilities now offer a "3D" mammogram, which may cost around $50 extra out of pocket. If possible,  we recommend you accept the option to have the 3D mammogram performed.  It both reduces the number of women who will be called back for extra views which then turn out to be normal, and it is better than the routine mammogram at detecting truly abnormal areas.    COLONOSCOPY:  Colonoscopy to screen for colon cancer is recommended for all women at age 50.  We know, you hate the idea of the prep.  We agree, BUT, having colon cancer and not knowing it is worse!!  Colon cancer so often starts as a polyp that can be seen and removed at colonscopy, which can quite literally save your life!  And if your first colonoscopy is normal and you have no family history of colon cancer, most women don't have to have it again for 10 years.  Once every ten years, you can do something that may end up saving your life, right?  We will be happy to help you get it scheduled when you are ready.    Be sure to check your insurance coverage so you understand how much it will cost.  It may be covered as a preventative service at no cost, but you should check your particular policy.      Kegel Exercises Kegel exercises help strengthen the muscles that support the rectum, vagina, small intestine, bladder, and uterus. Doing Kegel exercises can help:  Improve bladder and bowel control.  Improve sexual response.  Reduce problems and discomfort during pregnancy.  Kegel exercises involve squeezing your pelvic floor muscles, which are the same muscles you  squeeze when you try to stop the flow of urine. The exercises can be done while sitting, standing, or lying down, but it is best to vary your position. Phase 1 exercises 1. Squeeze your pelvic floor muscles tight. You should feel a tight lift in your rectal area. If you are a female, you should also feel a tightness in your vaginal area. Keep your stomach, buttocks, and legs relaxed. 2. Hold the muscles tight for up to 10 seconds. 3. Relax your muscles. Repeat this exercise 50 times a day or as many times as told by your health care provider. Continue to do this exercise for at least 4-6 weeks or for as long as told by your health care provider. This information is not intended to replace advice given to you by your health care provider. Make sure you discuss any questions you have with your health care provider. Document Released: 04/09/2012 Document Revised: 12/17/2015 Document Reviewed: 03/13/2015 Elsevier Interactive Patient Education  2018 Elsevier Inc.  

## 2017-09-09 NOTE — Progress Notes (Signed)
77 y.o. G1P1001 MarriedCaucasianF here for annual exam.   She is having constipation, h/o colitis. She is taking miralax. She is having a BM every other day (not taking the miralax daily). Doesn't always feel empty. No vaginal bulging. She uses estrace cream 1/2- gram 2 x a week. Sexually active, no pain. No vaginal bleeding.  She has mild GSI, no major change. Leaks a small amount 2 x a week, with a full bladder. Tolerable.     She has chronic neck pain, takes gabapentin at night.   Patient's last menstrual period was 05/07/1982 (approximate).          Sexually active: Yes.    The current method of family planning is post menopausal status.    Exercising: Yes.    walking Smoker:  no  Health Maintenance: Pap:  07/2009 negative  History of abnormal Pap:  no MMG:  09/06/16 BIRADS 1 negative  Colonoscopy:  05/2013, colitis, repeat 5 years BMD:   08/24/15 T Score: -2.4 Spine / -1.4 Right Femur Neck / -2.3 Left Femur Neck TDaP:  09/29/15  Gardasil: no    reports that she has never smoked. She has never used smokeless tobacco. She reports that she does not drink alcohol or use drugs. 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 She has daughter, lives in [place]. No grandchildren.   Past Medical History:  Diagnosis Date  . Anemia    diet related  . Asthma   . Atrophic vaginitis   . Colitis   . Hx of endometriosis    took Danacrine  . Hypercholesterolemia   . Osteoporosis    spine off Actonel sinsce 09/2010- Drug [facility]  . Premature ovarian failure age 66   HRT age 62 - 12/2000    Past Surgical History:  Procedure Laterality Date  . HYSTEROSCOPY  1996   DUB--wnl  . NASAL SINUS SURGERY  40's and 50's  . TUBAL LIGATION Bilateral 1982    Current Outpatient Medications  Medication Sig Dispense Refill  . Albuterol Sulfate (PROAIR RESPICLICK) 108 (90 Base) MCG/ACT AEPB ProAir RespiClick 90 mcg/actuation breath activated    . azelastine (ASTEPRO) 137 MCG/SPRAY nasal spray Place 1 spray into the  nose 2 (two) times daily. Use in each nostril as directed    . BIOTIN 5000 PO Take by mouth.    Marland Kitchen BREO ELLIPTA 200-25 MCG/INH AEPB Inhale 1 puff into the lungs daily.  1  . calcium carbonate (OS-CAL) 600 MG TABS Take 600 mg by mouth 2 (two) times daily with a meal.    . celecoxib (CELEBREX) 200 MG capsule TAKE ONE TABLET DAILY, WITH FOOD, FOR ARTHRITIS PAIN.  0  . cetirizine (ZYRTEC) 10 MG tablet Take 10 mg by mouth daily.    . cholecalciferol (VITAMIN D) 1000 units tablet Take 1 capsule by mouth daily.     Marland Kitchen co-enzyme Q-10 30 MG capsule Take 30 mg by mouth 3 (three) times daily.    . diclofenac (VOLTAREN) 75 MG EC tablet Take 75 mg by mouth 2 (two) times daily.    Marland Kitchen esomeprazole (NEXIUM) 40 MG capsule Take 40 mg by mouth daily at 12 noon.    . folic acid (FOLVITE) 400 MCG tablet Take 400 mcg by mouth daily.    Marland Kitchen gabapentin (NEURONTIN) 300 MG capsule gabapentin 300 mg capsule  TAKE 1 (ONE) CAPSULE AT BEDTIME    . glucosamine-chondroitin 500-400 MG tablet Take 1 tablet by mouth 3 (three) times daily.    . Lutein 6 MG CAPS Take  1 capsule by mouth daily.    Marland Kitchen LYSINE PO Take by mouth.    . meloxicam (MOBIC) 15 MG tablet Take 1 tablet by mouth daily as needed for pain.  3  . montelukast (SINGULAIR) 10 MG tablet Take 10 mg by mouth at bedtime.    . Multiple Vitamin (MULTIVITAMIN) tablet Take 1 tablet by mouth daily.    . NONFORMULARY OR COMPOUNDED ITEM Estrace vaginal cream 0.02% 1/2 gm twice weeky 24 each 4  . pramipexole (MIRAPEX) 1 MG tablet Take 1 tablet by mouth at bedtime.  5  . pyridOXINE (VITAMIN B-6) 100 MG tablet Take 100 mg by mouth daily.    . simvastatin (ZOCOR) 20 MG tablet TAKE 1 TABLET BY MOUTH EVERY DAY AT NIGHT  3  . Specialty Vitamins Products (MAGNESIUM, AMINO ACID CHELATE,) 133 MG tablet Take 1 tablet by mouth 2 (two) times daily.    Pauline Aus HFA 45 MCG/ACT inhaler      No current facility-administered medications for this visit.     Family History  Problem Relation Age  of Onset  . Prostate cancer Father 24  . Alzheimer's disease Mother 66  . Other Sister        dizziness  . Cancer Sister 30       Brain and Lung  . Ulcerative colitis Sister   . Migraines Sister   . Cancer - Colon Maternal Grandfather 51    Review of Systems  Constitutional: Negative.   HENT: Positive for sinus pressure.   Eyes: Negative.   Respiratory: Negative.   Cardiovascular: Negative.   Gastrointestinal: Positive for constipation.  Endocrine: Negative.   Genitourinary: Positive for urgency.       Loss of urine with cough or sneeze  Musculoskeletal: Negative.   Allergic/Immunologic: Negative.   Neurological: Negative.   Hematological: Negative.   Psychiatric/Behavioral: Negative.     Exam:   BP 114/60 (BP Location: Right Arm, Patient Position: Sitting, Cuff Size: Normal)   Pulse 78   Resp 14   Ht  (1.549 m)   Wt 115 lb (52.2 kg)   LMP 05/07/1982 (Approximate)   BMI 21.73 kg/m   Weight change: @ Height:   Height:  (154.9 cm)  Ht Readings from Last 3 Encounters:  09/09/17  (1.549 m)  09/04/16  (1.549 m)  08/10/15  (1.549 m)    General appearance: alert, cooperative and appears stated age Head: Normocephalic, without obvious abnormality, atraumatic Neck: no adenopathy, supple, symmetrical, trachea midline and thyroid normal to inspection and palpation Lungs: clear to auscultation bilaterally Cardiovascular: regular rate and rhythm Breasts: normal appearance, no masses or tenderness Abdomen: soft, non-tender; non distended,  no masses,  no organomegaly Extremities: extremities normal, atraumatic, no cyanosis or edema Skin: Skin color, texture, turgor normal. No rashes or lesions Lymph nodes: Cervical, supraclavicular, and axillary nodes normal. No abnormal inguinal nodes palpated Neurologic: Grossly normal   Pelvic: External genitalia:  no lesions              Urethra:  normal appearing urethra with no masses,  tenderness or lesions              Bartholins and Skenes: normal                 Vagina: atrophic appearing vagina with normal color and discharge, no lesions              Cervix: no lesions and nabothian cyst  Bimanual Exam:  Uterus:  normal size, contour, position, consistency, mobility, non-tender              Adnexa: no mass, fullness, tenderness               Rectovaginal: Confirms               Anus:  normal sphincter tone, no lesions  Chaperone was present for exam.  A:  Well Woman with normal exam  Osteopenia (previously osteoporosis, treated with Actonel)  Vaginal atrophy, using compounded estrogen cream.   P:   Labs with primary  Discussed breast self exam  Discussed calcium and vit D intake  DEXA ordered  Mammogram   Colonoscopy 1/20  Continue compounded estrogen cream.   No pap

## 2017-09-09 NOTE — Telephone Encounter (Signed)
Rx faxed to [facility] for vaginal estrogen cream.   Call placed to patient, no answer. Left detailed message, ok per dpr. Advised Rx for vaginal estrogen cream has been faxed to [facility], f/u for filling. Return call to office for any additional questions. Will close encounter.

## 2017-09-09 NOTE — Telephone Encounter (Addendum)
-----   Message from Romualdo Bolk, MD sent at 09/09/2017  5:28 PM EDT ----- Can you please refill her compounded vaginal estrogen. She should be getting the equivalent dose to estrace cream, 1 gram vaginally 2 x a week at hs. 3 month supply with 3 refills. Please call into [facility].

## 2017-10-07 ENCOUNTER — Other Ambulatory Visit: Payer: Self-pay | Admitting: Obstetrics and Gynecology

## 2017-10-07 DIAGNOSIS — Z1231 Encounter for screening mammogram for malignant neoplasm of breast: Secondary | ICD-10-CM

## 2017-10-09 ENCOUNTER — Ambulatory Visit (HOSPITAL_COMMUNITY)
Admission: RE | Admit: 2017-10-09 | Discharge: 2017-10-09 | Disposition: A | Discharge status: Home / Self Care | Payer: Medicare Other | Source: Ambulatory Visit | Attending: Obstetrics and Gynecology | Admitting: Obstetrics and Gynecology

## 2017-10-09 DIAGNOSIS — Z1231 Encounter for screening mammogram for malignant neoplasm of breast: Secondary | ICD-10-CM | POA: Diagnosis present

## 2017-10-09 DIAGNOSIS — M810 Age-related osteoporosis without current pathological fracture: Secondary | ICD-10-CM | POA: Insufficient documentation

## 2017-10-09 DIAGNOSIS — M8580 Other specified disorders of bone density and structure, unspecified site: Secondary | ICD-10-CM | POA: Diagnosis present

## 2017-10-10 ENCOUNTER — Ambulatory Visit (HOSPITAL_COMMUNITY): Payer: Medicare Other

## 2017-10-22 ENCOUNTER — Ambulatory Visit: Payer: Medicare Other | Admitting: Obstetrics and Gynecology

## 2017-10-31 ENCOUNTER — Encounter: Payer: Self-pay | Admitting: Obstetrics and Gynecology

## 2017-10-31 ENCOUNTER — Other Ambulatory Visit: Payer: Self-pay

## 2017-10-31 ENCOUNTER — Ambulatory Visit: Payer: Medicare Other | Admitting: Obstetrics and Gynecology

## 2017-10-31 VITALS — BP 118/70 | HR 76 | Resp 16 | Wt 114.0 lb

## 2017-10-31 DIAGNOSIS — M810 Age-related osteoporosis without current pathological fracture: Secondary | ICD-10-CM | POA: Diagnosis not present

## 2017-10-31 MED ORDER — RISEDRONATE SODIUM 35 MG PO TABS: 35.0000 mg | ORAL_TABLET | ORAL | 3 refills | Status: DC

## 2017-10-31 NOTE — Progress Notes (Signed)
GYNECOLOGY  VISIT   HPI: 77 y.o.   Married  Caucasian  female   G1P1001 with Patient's last menstrual period was 05/07/1982 (approximate).   here for DEXA results. Recent DEXA with osteoporosis in the spine and hip, low T score of -2.8. 4.8-5.4% decrease in BMD. She is taking one calcium pill a day with vit D, + another 1,000 IU of vit D. Not exercising like she used too.     GYNECOLOGIC HISTORY: Patient's last menstrual period was 05/07/1982 (approximate). Contraception:postmenopause  Menopausal hormone therapy: Estradiol         OB History    Gravida  1   Para  1   Term  1   Preterm  0   AB  0   Living  1     SAB  0   TAB  0   Ectopic  0   Multiple  0   Live Births  1              Patient Active Problem List   Diagnosis Date Noted  . 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 Unspecified vitamin D deficiency 08/06/2013  . Osteoporosis, unspecified 08/04/2012  . 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 Anemia    diet related  . Asthma   . Atrophic vaginitis   . Colitis   . Hx of endometriosis    took Danacrine  . Hypercholesterolemia   . Osteoporosis    spine off Actonel sinsce 09/2010- [facility]  . Premature ovarian failure age 62   HRT age 43 - 12/2000    Past Surgical History:  Procedure Laterality Date  . HYSTEROSCOPY  1996   DUB--wnl  . NASAL SINUS SURGERY  40's and 50's  . TUBAL LIGATION Bilateral 1982    Current Outpatient Medications  Medication Sig Dispense Refill  . Albuterol Sulfate (PROAIR RESPICLICK) 108 (90 Base) MCG/ACT AEPB ProAir RespiClick 90 mcg/actuation breath activated    . azelastine (ASTEPRO) 137 MCG/SPRAY nasal spray Place 1 spray into the nose 2 (two) times daily. Use in each nostril as directed    . BIOTIN 5000 PO Take by mouth.    Marland Kitchen BREO ELLIPTA 200-25 MCG/INH AEPB Inhale 1 puff into the lungs daily.  1  . calcium carbonate (OS-CAL) 600 MG TABS Take 600 mg by  mouth 2 (two) times daily with a meal.    . celecoxib (CELEBREX) 200 MG capsule TAKE ONE TABLET DAILY, WITH FOOD, FOR ARTHRITIS PAIN.  0  . cetirizine (ZYRTEC) 10 MG tablet Take 10 mg by mouth daily.    . cholecalciferol (VITAMIN D) 1000 units tablet Take 1 capsule by mouth daily.     Marland Kitchen co-enzyme Q-10 30 MG capsule Take 30 mg by mouth 3 (three) times daily.    . diclofenac (VOLTAREN) 75 MG EC tablet Take 75 mg by mouth 2 (two) times daily.    Marland Kitchen esomeprazole (NEXIUM) 40 MG capsule Take 40 mg by mouth daily at 12 noon.    . folic acid (FOLVITE) 400 MCG tablet Take 400 mcg by mouth daily.    Marland Kitchen gabapentin (NEURONTIN) 300 MG capsule gabapentin 300 mg capsule  TAKE 1 (ONE) CAPSULE AT BEDTIME    . glucosamine-chondroitin 500-400 MG tablet Take 1 tablet by mouth 3 (three) times daily.    . Lutein 6 MG CAPS Take 1 capsule by mouth daily.    Marland Kitchen LYSINE PO Take by mouth.    Marland Kitchen  meloxicam (MOBIC) 15 MG tablet Take 1 tablet by mouth daily as needed for pain.  3  . montelukast (SINGULAIR) 10 MG tablet Take 10 mg by mouth at bedtime.    . Multiple Vitamin (MULTIVITAMIN) tablet Take 1 tablet by mouth daily.    . NONFORMULARY OR COMPOUNDED ITEM Compounded vaginal cream Estrace or Estradiol 0.02 %  Place one gram vaginally two times a week. 24 each 3  . pramipexole (MIRAPEX) 1 MG tablet Take 1 tablet by mouth at bedtime.  5  . pyridOXINE (VITAMIN B-6) 100 MG tablet Take 100 mg by mouth daily.    . simvastatin (ZOCOR) 20 MG tablet TAKE 1 TABLET BY MOUTH EVERY DAY AT NIGHT  3  . Specialty Vitamins Products (MAGNESIUM, AMINO ACID CHELATE,) 133 MG tablet Take 1 tablet by mouth 2 (two) times daily.    Pauline Aus. XOPENEX HFA 45 MCG/ACT inhaler      No current facility-administered medications for this visit.      ALLERGIES: Cephalexin; Erythromycin base; Hydrocodone-homatropine; Levofloxacin; Penicillins; Prednisone; Sulfamethoxazole-trimethoprim; and Augmentin [amoxicillin-pot clavulanate]  Family History  Problem Relation  Age of Onset  . Prostate cancer Father 2074  . Alzheimer's disease Mother 2288  . Other Sister        dizziness  . Cancer Sister [id number]       Brain and Lung  . Ulcerative colitis Sister   . Migraines Sister   . Cancer - Colon Maternal Grandfather 2051    Social History   Socioeconomic History  . Marital status: Married    Spouse name: Not on file  . Number of children: Not on file  . Years of education: Not on file  . Highest education level: Not on file  Occupational History  . Not on file  Social Needs  . Financial resource strain: Not on file  . Food insecurity:    Worry: Not on file    Inability: Not on file  . Transportation needs:    Medical: Not on file    Non-medical: Not on file  Tobacco Use  . Smoking status: Never Smoker  . Smokeless tobacco: Never Used  Substance and Sexual Activity  . Alcohol use: No  . Drug use: No  . Sexual activity: Yes    Partners: Male    Birth control/protection: Post-menopausal  Lifestyle  . Physical activity:    Days per week: Not on file    Minutes per session: Not on file  . Stress: Not on file  Relationships  . Social connections:    Talks on phone: Not on file    Gets together: Not on file    Attends religious service: Not on file    Active member of club or organization: Not on file    Attends meetings of clubs or organizations: Not on file    Relationship status: Not on file  . Intimate partner violence:    Fear of current or ex partner: Not on file    Emotionally abused: Not on file    Physically abused: Not on file    Forced sexual activity: Not on file  Other Topics Concern  . Not on file  Social History Narrative  . Not on file    Review of Systems  Constitutional: Negative.   HENT: Negative.   Eyes: Negative.   Respiratory: Negative.   Cardiovascular: Negative.   Gastrointestinal: Negative.   Genitourinary: Negative.   Musculoskeletal: Negative.   Skin: Negative.   Neurological: Negative.    Endo/Heme/Allergies:  Negative.   Psychiatric/Behavioral: Negative.     PHYSICAL EXAMINATION:    BP 118/70 (BP Location: Right Arm, Patient Position: Sitting, Cuff Size: Normal)   Pulse 76   Resp 16   Wt 114 lb (51.7 kg)   LMP 05/07/1982 (Approximate)   BMI 21.54 kg/m     General appearance: alert, cooperative and appears stated age  ASSESSMENT Osteoporosis, was previously on Actonel (not for many years)    PLAN Screening labs Discussed options of treatment, including actonel, prolia, evista She has been on Actonel previously and tolerated it Discussed side effects of actonel Will restart Actonel weekly, if tolerating we can switch to monthly (patient will call)  Increase calcium to 1,200 mg a day, continue vit D Recommended exercise, walking F/U DEXA in 2 years   An After Visit Summary was printed and given to the patient.  ~15 minutes face to face time of which over 50% was spent in counseling.

## 2017-11-01 LAB — COMPREHENSIVE METABOLIC PANEL
A/G RATIO: 2.7 — AB (ref 1.2–2.2)
ALK PHOS: 84 IU/L (ref 39–117)
ALT: 23 IU/L (ref 0–32)
AST: 25 IU/L (ref 0–40)
Albumin: 4.3 g/dL (ref 3.5–4.8)
BUN/Creatinine Ratio: 28 (ref 12–28)
BUN: 24 mg/dL (ref 8–27)
Bilirubin Total: 0.5 mg/dL (ref 0.0–1.2)
CALCIUM: 9.2 mg/dL (ref 8.7–10.3)
CO2: 21 mmol/L (ref 20–29)
Chloride: 102 mmol/L (ref 96–106)
Creatinine, Ser: 0.87 mg/dL (ref 0.57–1.00)
GFR calc Af Amer: 75 mL/min/{1.73_m2} (ref >59)
GFR calc non Af Amer: 65 mL/min/{1.73_m2} (ref >59)
GLOBULIN, TOTAL: 1.6 g/dL (ref 1.5–4.5)
Glucose: 139 mg/dL — ABNORMAL HIGH (ref 65–99)
POTASSIUM: 4.4 mmol/L (ref 3.5–5.2)
SODIUM: 141 mmol/L (ref 134–144)
Total Protein: 5.9 g/dL — ABNORMAL LOW (ref 6.0–8.5)

## 2017-11-01 LAB — CBC
HEMATOCRIT: 37.6 % (ref 34.0–46.6)
Hemoglobin: 12 g/dL (ref 11.1–15.9)
MCH: 29.7 pg (ref 26.6–33.0)
MCHC: 31.9 g/dL (ref 31.5–35.7)
MCV: 93 fL (ref 79–97)
PLATELETS: 292 10*3/uL (ref 150–450)
RBC: 4.04 x10E6/uL (ref 3.77–5.28)
RDW: 13.5 % (ref 12.3–15.4)
WBC: 8.5 10*3/uL (ref 3.4–10.8)

## 2017-11-01 LAB — VITAMIN D 25 HYDROXY (VIT D DEFICIENCY, FRACTURES): VIT D 25 HYDROXY: 40.8 ng/mL (ref 30.0–100.0)

## 2017-11-01 LAB — PHOSPHORUS: PHOSPHORUS: 3.7 mg/dL (ref 2.5–4.5)

## 2017-11-06 ENCOUNTER — Telehealth: Payer: Self-pay

## 2017-11-06 NOTE — Telephone Encounter (Signed)
Spoke with patient. Results given. Patient verbalizes understanding. Encounter closed. 

## 2017-11-06 NOTE — Telephone Encounter (Signed)
-----   Message from Romualdo BolkJill Evelyn Jertson, MD sent at 11/06/2017  9:30 AM EDT ----- Please let the patient know that her glucose was mildly elevated, I'm trying to add a HgbA1C to her blood work, if not she may need to return for that. The rest of her lab work was normal. She can start the Actonel.

## 2017-11-14 DIAGNOSIS — R7309 Other abnormal glucose: Secondary | ICD-10-CM | POA: Insufficient documentation

## 2017-11-23 LAB — HGB A1C W/O EAG: Hgb A1c MFr Bld: 5.7 % — ABNORMAL HIGH (ref 4.8–5.6)

## 2017-11-23 LAB — SPECIMEN STATUS REPORT

## 2018-09-25 ENCOUNTER — Ambulatory Visit: Payer: Medicare Other | Admitting: Obstetrics and Gynecology

## 2018-11-06 ENCOUNTER — Telehealth: Payer: Self-pay | Admitting: Obstetrics and Gynecology

## 2018-11-06 ENCOUNTER — Encounter: Payer: Self-pay | Admitting: Obstetrics and Gynecology

## 2018-11-06 ENCOUNTER — Ambulatory Visit (INDEPENDENT_AMBULATORY_CARE_PROVIDER_SITE_OTHER): Payer: Medicare Other | Admitting: Obstetrics and Gynecology

## 2018-11-06 ENCOUNTER — Other Ambulatory Visit: Payer: Self-pay

## 2018-11-06 VITALS — BP 124/70 | HR 80 | Temp 98.0°F | Ht 60.83 in | Wt 106.8 lb

## 2018-11-06 DIAGNOSIS — Z01419 Encounter for gynecological examination (general) (routine) without abnormal findings: Secondary | ICD-10-CM

## 2018-11-06 DIAGNOSIS — M810 Age-related osteoporosis without current pathological fracture: Secondary | ICD-10-CM

## 2018-11-06 NOTE — Patient Instructions (Addendum)
EXERCISE AND DIET:  We recommended that you start or continue a regular exercise program for good health. Regular exercise means any activity that makes your heart beat faster and makes you sweat.  We recommend exercising at least 30 minutes per day at least 3 days a week, preferably 4 or 5.  We also recommend a diet low in fat and sugar.  Inactivity, poor dietary choices and obesity can cause diabetes, heart attack, stroke, and kidney damage, among others.   ° °ALCOHOL AND SMOKING:  Women should limit their alcohol intake to no more than 7 drinks/beers/glasses of wine (combined, not each!) per week. Moderation of alcohol intake to this level decreases your risk of breast cancer and liver damage. And of course, no recreational drugs are part of a healthy lifestyle.  And absolutely no smoking or even second hand smoke. Most people know smoking can cause heart and lung diseases, but did you know it also contributes to weakening of your bones? Aging of your skin?  Yellowing of your teeth and nails? ° °CALCIUM AND VITAMIN D:  Adequate intake of calcium and Vitamin D are recommended.  The recommendations for exact amounts of these supplements seem to change often, but generally speaking 1,200 mg of calcium (between diet and supplement) and 800 units of Vitamin D per day seems prudent. Certain women may benefit from higher intake of Vitamin D.  If you are among these women, your doctor will have told you during your visit.   ° °PAP SMEARS:  Pap smears, to check for cervical cancer or precancers,  have traditionally been done yearly, although recent scientific advances have shown that most women can have pap smears less often.  However, every woman still should have a physical exam from her gynecologist every year. It will include a breast check, inspection of the vulva and vagina to check for abnormal growths or skin changes, a visual exam of the cervix, and then an exam to evaluate the size and shape of the uterus and  ovaries.  And after 76 years of age, a rectal exam is indicated to check for rectal cancers. We will also provide age appropriate advice regarding health maintenance, like when you should have certain vaccines, screening for sexually transmitted diseases, bone density testing, colonoscopy, mammograms, etc.  ° °MAMMOGRAMS:  All women over 40 years old should have a yearly mammogram. Many facilities now offer a "3D" mammogram, which may cost around $50 extra out of pocket. If possible,  we recommend you accept the option to have the 3D mammogram performed.  It both reduces the number of women who will be called back for extra views which then turn out to be normal, and it is better than the routine mammogram at detecting truly abnormal areas.   ° °COLON CANCER SCREENING: Now recommend starting at age 45. At this time colonoscopy is not covered for routine screening until 50. There are take home tests that can be done between 45-49.  ° °COLONOSCOPY:  Colonoscopy to screen for colon cancer is recommended for all women at age 50.  We know, you hate the idea of the prep.  We agree, BUT, having colon cancer and not knowing it is worse!!  Colon cancer so often starts as a polyp that can be seen and removed at colonscopy, which can quite literally save your life!  And if your first colonoscopy is normal and you have no family history of colon cancer, most women don't have to have it again for   10 years.  Once every ten years, you can do something that may end up saving your life, right?  We will be happy to help you get it scheduled when you are ready.  Be sure to check your insurance coverage so you understand how much it will cost.  It may be covered as a preventative service at no cost, but you should check your particular policy.   ° ° ° °Breast Self-Awareness °Breast self-awareness means being familiar with how your breasts look and feel. It involves checking your breasts regularly and reporting any changes to your  health care provider. °Practicing breast self-awareness is important. A change in your breasts can be a sign of a serious medical problem. Being familiar with how your breasts look and feel allows you to find any problems early, when treatment is more likely to be successful. All women should practice breast self-awareness, including women who have had breast implants. °How to do a breast self-exam °One way to learn what is normal for your breasts and whether your breasts are changing is to do a breast self-exam. To do a breast self-exam: °Look for Changes ° °1. Remove all the clothing above your waist. °2. Stand in front of a mirror in a room with good lighting. °3. Put your hands on your hips. °4. Push your hands firmly downward. °5. Compare your breasts in the mirror. Look for differences between them (asymmetry), such as: °? Differences in shape. °? Differences in size. °? Puckers, dips, and bumps in one breast and not the other. °6. Look at each breast for changes in your skin, such as: °? Redness. °? Scaly areas. °7. Look for changes in your nipples, such as: °? Discharge. °? Bleeding. °? Dimpling. °? Redness. °? A change in position. °Feel for Changes °Carefully feel your breasts for lumps and changes. It is best to do this while lying on your back on the floor and again while sitting or standing in the shower or tub with soapy water on your skin. Feel each breast in the following way: °· Place the arm on the side of the breast you are examining above your head. °· Feel your breast with the other hand. °· Start in the nipple area and make ¾ inch (2 cm) overlapping circles to feel your breast. Use the pads of your three middle fingers to do this. Apply light pressure, then medium pressure, then firm pressure. The light pressure will allow you to feel the tissue closest to the skin. The medium pressure will allow you to feel the tissue that is a little deeper. The firm pressure will allow you to feel the tissue  close to the ribs. °· Continue the overlapping circles, moving downward over the breast until you feel your ribs below your breast. °· Move one finger-width toward the center of the body. Continue to use the ¾ inch (2 cm) overlapping circles to feel your breast as you move slowly up toward your collarbone. °· Continue the up and down exam using all three pressures until you reach your armpit. ° °Write Down What You Find ° °Write down what is normal for each breast and any changes that you find. Keep a written record with breast changes or normal findings for each breast. By writing this information down, you do not need to depend only on memory for size, tenderness, or location. Write down where you are in your menstrual cycle, if you are still menstruating. °If you are having trouble noticing differences   in your breasts, do not get discouraged. With time you will become more familiar with the variations in your breasts and more comfortable with the exam. How often should I examine my breasts? Examine your breasts every month. If you are breastfeeding, the best time to examine your breasts is after a feeding or after using a breast pump. If you menstruate, the best time to examine your breasts is 5-7 days after your period is over. During your period, your breasts are lumpier, and it may be more difficult to notice changes. When should I see my health care provider? See your health care provider if you notice:  A change in shape or size of your breasts or nipples.  A change in the skin of your breast or nipples, such as a reddened or scaly area.  Unusual discharge from your nipples.  A lump or thick area that was not there before.  Pain in your breasts.  Anything that concerns you.   Osteoporosis  Osteoporosis is thinning and loss of density in your bones. Osteoporosis makes bones more brittle and fragile and more likely to break (fracture). Over time, osteoporosis can cause your bones to become  so weak that they fracture after a minor fall. Bones in the hip, wrist, and spine are most likely to fracture due to osteoporosis. What are the causes? The exact cause of this condition is not known. What increases the risk? You may be at greater risk for osteoporosis if you:  Have a family history of the condition.  Have poor nutrition.  Use steroid medicines, such as prednisone.  Are female.  Are age 85 or older.  Smoke or have a history of smoking.  Are not physically active (are sedentary).  Are white (Caucasian) or of Asian descent.  Have a small body frame.  Take certain medicines, such as antiseizure medicines. What are the signs or symptoms? A fracture might be the first sign of osteoporosis, especially if the fracture results from a fall or injury that usually would not cause a bone to break. Other signs and symptoms include:  Pain in the neck or low back.  Stooped posture.  Loss of height. How is this diagnosed? This condition may be diagnosed based on:  Your medical history.  A physical exam.  A bone mineral density test, also called a DXA or DEXA test (dual-energy X-ray absorptiometry test). This test uses X-rays to measure the amount of minerals in your bones. How is this treated? The goal of treatment is to strengthen your bones and lower your risk for a fracture. Treatment may involve:  Making lifestyle changes, such as: ? Including foods with more calcium and vitamin D in your diet. ? Doing weight-bearing and muscle-strengthening exercises. ? Stopping tobacco use. ? Limiting alcohol intake.  Taking medicine to slow the process of bone loss or to increase bone density.  Taking daily supplements of calcium and vitamin D.  Taking hormone replacement medicines, such as estrogen for women and testosterone for men.  Monitoring your levels of calcium and vitamin D. Follow these instructions at home:  Activity  Exercise as told by your health care  provider. Ask your health care provider what exercises and activities are safe for you. You should do: ? Exercises that make you work against gravity (weight-bearing exercises), such as tai chi, yoga, or walking. ? Exercises to strengthen muscles, such as lifting weights. Lifestyle  Limit alcohol intake to no more than 1 drink a day for nonpregnant women and  2 drinks a day for men. One drink equals 12 oz of beer, 5 oz of wine, or 1 oz of hard liquor.  Do not use any products that contain nicotine or tobacco, such as cigarettes and e-cigarettes. If you need help quitting, ask your health care provider. Preventing falls  Use devices to help you move around (mobility aids) as needed, such as canes, walkers, scooters, or crutches.  Keep rooms well-lit and clutter-free.  Remove tripping hazards from walkways, including cords and throw rugs.  Install grab bars in bathrooms and safety rails on stairs.  Use rubber mats in the bathroom and other areas that are often wet or slippery.  Wear closed-toe shoes that fit well and support your feet. Wear shoes that have rubber soles or low heels.  Review your medicines with your health care provider. Some medicines can cause dizziness or changes in blood pressure, which can increase your risk of falling. General instructions  Include calcium and vitamin D in your diet. Calcium is important for bone health, and vitamin D helps your body to absorb calcium. Good sources of calcium and vitamin D include: ? Certain fatty fish, such as salmon and tuna. ? Products that have calcium and vitamin D added to them (fortified products), such as fortified cereals. ? Egg yolks. ? Cheese. ? Liver.  Take over-the-counter and prescription medicines only as told by your health care provider.  Keep all follow-up visits as told by your health care provider. This is important. Contact a health care provider if:  You have never been screened for osteoporosis and you  are: ? A woman who is age 50 or older. ? A man who is age 48 or older. Get help right away if:  You fall or injure yourself. Summary  Osteoporosis is thinning and loss of density in your bones. This makes bones more brittle and fragile and more likely to break (fracture),even with minor falls.  The goal of treatment is to strengthen your bones and reduce your risk for a fracture.  Include calcium and vitamin D in your diet. Calcium is important for bone health, and vitamin D helps your body to absorb calcium.  Talk with your health care provider about screening for osteoporosis if you are a woman who is age 10 or older, or a man who is age 12 or older. This information is not intended to replace advice given to you by your health care provider. Make sure you discuss any questions you have with your health care provider. Document Released: 01/31/2005 Document Revised: 04/05/2017 Document Reviewed: 02/15/2017 Elsevier Patient Education  2020 [employer].

## 2018-11-06 NOTE — Telephone Encounter (Signed)
Patient called stating her PCP Dr.Fred Redmond Pulling thought Prolia would be a good idea. Their office would be happy to give her the injections, but wanted [place] to prescribe it. Advised patient labs have to be followed with Prolia. Advised Dr.Jertson may feel more comfortable referring her to a Rheumatologist or Endocrinologist. She said she will do whatever Dr.Jertson recommends. Routed to provider.

## 2018-11-06 NOTE — Progress Notes (Signed)
78 y.o. [id number] Married White or Caucasian Not Hispanic or Latino female here for annual exam.  Actonel is making her reflux worse. She was noted to have reflux on endoscopy earlier this year, nothing else. For a couple of days she notices some increase in reflux, moderate. The rest of the time she does fine as long as she avoids certain foods and takes her Prilosec.   Husband is with kidney disease, may need dialysis. Started last year as a side effect of medication.   Infrequently sexually active. Not using the vaginal estrogen, doing okay with lubrication.   Chronic neck pain. Primary has recommended a cortisone shot. She is on Gabapentin    Patient's last menstrual period was 05/07/1982 (approximate).          Sexually active: Yes.    The current method of family planning is post menopausal status.    Exercising: Yes.    walking Smoker:  no  Health Maintenance: Pap:  07/2009 negative  History of abnormal Pap:  no MMG:  10/09/2017 Birads 1 negative Colonoscopy:  2020 WNL BMD:   10/09/2017 Osteoporosis, on actonel TDaP:  09/29/15  Gardasil: no    reports that she has never smoked. She has never used smokeless tobacco. She reports that she does not drink alcohol or use drugs. 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 She has daughter, lives in [place]. No grandchildren.   Past Medical History:  Diagnosis Date  . Anemia    diet related  . Asthma   . Atrophic vaginitis   . Colitis   . Hx of endometriosis    took Danacrine  . Hypercholesterolemia   . Osteoporosis    spine off Actonel sinsce 09/2010- [facility]  . Premature ovarian failure age 78   HRT age 78 - 12/2000    Past Surgical History:  Procedure Laterality Date  . HYSTEROSCOPY  1996   DUB--wnl  . NASAL SINUS SURGERY  40's and 50's  . TUBAL LIGATION Bilateral 1982    Current Outpatient Medications  Medication Sig Dispense Refill  . Albuterol Sulfate (PROAIR RESPICLICK) 108 (90 Base) MCG/ACT AEPB ProAir RespiClick 90  mcg/actuation breath activated    . BIOTIN 5000 PO Take by mouth.    Marland Kitchen. BREO ELLIPTA 200-25 MCG/INH AEPB Inhale 1 puff into the lungs daily.  1  . calcium carbonate (OS-CAL) 600 MG TABS Take 600 mg by mouth 2 (two) times daily with a meal.    . celecoxib (CELEBREX) 200 MG capsule TAKE ONE TABLET DAILY, WITH FOOD, FOR ARTHRITIS PAIN.  0  . cetirizine (ZYRTEC) 10 MG tablet Take 10 mg by mouth daily.    . cholecalciferol (VITAMIN D) 1000 units tablet Take 1 capsule by mouth daily.     Marland Kitchen. co-enzyme Q-10 30 MG capsule Take 30 mg by mouth 3 (three) times daily.    . fluticasone (FLONASE) 50 MCG/ACT nasal spray Place into both nostrils daily.    Marland Kitchen. gabapentin (NEURONTIN) 300 MG capsule gabapentin 300 mg capsule  TAKE 1 (ONE) CAPSULE AT BEDTIME    . glucosamine-chondroitin 500-400 MG tablet Take 1 tablet by mouth 3 (three) times daily.    . Lutein 6 MG CAPS Take 1 capsule by mouth daily.    Marland Kitchen. LYSINE PO Take by mouth.    . meloxicam (MOBIC) 15 MG tablet Take 1 tablet by mouth daily as needed for pain.  3  . montelukast (SINGULAIR) 10 MG tablet Take 10 mg by mouth at bedtime.    . Multiple  Vitamin (MULTIVITAMIN) tablet Take 1 tablet by mouth daily.    Marland Kitchen. omeprazole (PRILOSEC) 10 MG capsule Take 10 mg by mouth daily.    . pramipexole (MIRAPEX) 1 MG tablet Take 1 tablet by mouth at bedtime.  5  . risedronate (ACTONEL) 35 MG tablet Take 1 tablet (35 mg total) by mouth every 7 (seven) days. with water on empty stomach, nothing by mouth or lie down for next 30 minutes. 12 tablet 3  . simvastatin (ZOCOR) 20 MG tablet TAKE 1 TABLET BY MOUTH EVERY DAY AT NIGHT  3  . Specialty Vitamins Products (MAGNESIUM, AMINO ACID CHELATE,) 133 MG tablet Take 1 tablet by mouth 2 (two) times daily.    Pauline Aus. XOPENEX HFA 45 MCG/ACT inhaler      No current facility-administered medications for this visit.     Family History  Problem Relation Age of Onset  . Prostate cancer Father 7874  . Alzheimer's disease Mother 6988  . Other  Sister        dizziness  . Cancer Sister 3078       Brain and Lung  . Ulcerative colitis Sister   . Migraines Sister   . Cancer - Colon Maternal Grandfather 51    Review of Systems  Constitutional: Negative.   HENT: Negative.   Eyes: Negative.   Respiratory: Negative.   Cardiovascular: Negative.   Gastrointestinal: Negative.   Endocrine: Negative.   Genitourinary: Negative.   Musculoskeletal: Negative.   Skin: Negative.   Allergic/Immunologic: Negative.   Neurological: Negative.   Hematological: Negative.   Psychiatric/Behavioral: Negative.   She has mild GSI. Tolerable.   Exam:   BP 124/70 (BP Location: Right Arm, Patient Position: Sitting, Cuff Size: Normal)   Pulse 80   Temp 98 F (36.7 C) (Skin)   Ht 5' 0.83" (1.545 m)   Wt 106 lb 12.8 oz (48.4 kg)   LMP 05/07/1982 (Approximate)   BMI 20.29 kg/m   Weight change: @WEIGHTCHANGE @ Height:   Height: 5' 0.83" (154.5 cm)  Ht Readings from Last 3 Encounters:  11/06/18 5' 0.83" (1.545 m)  09/09/17 5\' 1"  (1.549 m)  09/04/16 5\' 1"  (1.549 m)    General appearance: alert, cooperative and appears stated age Head: Normocephalic, without obvious abnormality, atraumatic Neck: no adenopathy, supple, symmetrical, trachea midline and thyroid normal to inspection and palpation Lungs: clear to auscultation bilaterally Cardiovascular: regular rate and rhythm Breasts: normal appearance, no masses or tenderness Abdomen: soft, non-tender; non distended,  no masses,  no organomegaly Extremities: extremities normal, atraumatic, no cyanosis or edema Skin: Skin color, texture, turgor normal. No rashes or lesions Lymph nodes: Cervical, supraclavicular, and axillary nodes normal. No abnormal inguinal nodes palpated Neurologic: Grossly normal   Pelvic: External genitalia:  no lesions              Urethra:  normal appearing urethra with no masses, tenderness or lesions              Bartholins and Skenes: normal                 Vagina:  atrophic appearing vagina with normal color and discharge, no lesions              Cervix: no lesions               Bimanual Exam:  Uterus:  normal size, contour, position, consistency, mobility, non-tender              Adnexa: no mass, fullness, tenderness  Rectovaginal: Confirms               Anus:  normal sphincter tone, no lesions  Chaperone was present for exam.  A:  Well Woman with normal exam  Osteoporosis, on Actonel, have some worsening in reflux    P:   No pap  Discussed breast self exam  Discussed calcium and vit D intake  Mammogram due  Colonoscopy UTD  Given her worsening reflux, recommend that she f/u with her primary or Rheumatology for Reclast or Prolia.  Will check if her Primary's office does this.

## 2018-11-06 NOTE — Telephone Encounter (Signed)
Patient says she called her primary care Dr Kathryne Eriksson and he thinks it is a good idea for her to start Prolia.

## 2018-11-07 NOTE — Telephone Encounter (Signed)
I would recommend sending her to Rheumatology, I just wanted to give her primary the opportunity to manage her.

## 2018-11-10 NOTE — Telephone Encounter (Signed)
Order entered for Rheumatology and Endocrinology referral.  Will have referral department see which appointment can be utilized and cancel remaining referral.

## 2018-11-12 NOTE — Telephone Encounter (Signed)
Patient aware she should get a call from Rheumatology or Endocrinology to schedule appointment. She voiced understanding.

## 2018-11-12 NOTE — Telephone Encounter (Signed)
Left message to call Joni Norrod, CMA. °

## 2018-11-14 NOTE — Telephone Encounter (Signed)
Patient seen by Dr. Bo Merino today.

## 2018-11-19 ENCOUNTER — Other Ambulatory Visit (HOSPITAL_COMMUNITY): Payer: Self-pay | Admitting: Obstetrics and Gynecology

## 2018-11-19 DIAGNOSIS — Z1231 Encounter for screening mammogram for malignant neoplasm of breast: Secondary | ICD-10-CM

## 2018-12-01 NOTE — Progress Notes (Signed)
Office Visit Note  Patient: Megan Logan             Date of Birth: 1940/05/12           MRN: [id number]             PCP: Barbie BannerWilson, Fred H, MD Referring: Romualdo BolkJertson, Jill Evelyn, MD Visit Date: 12/15/2018 Occupation: @GUAROCC @  Subjective:  Osteoporosis.   History of Present Illness: Megan Logan is a 78 y.o. female seen in consultation per request of her GYN.  According to patient she was diagnosed with osteoporosis several years ago.  At the time she took Actonel for couple of years and it was discontinued as her T score improved.  She had bone densities every 2 years.  She states in 2019 her DEXA scan showed T score in the osteoporosis range again.  She was placed on Actonel but she developed severe reflux.  She stopped Actonel about 6 weeks ago.  She states she has been taking calcium and vitamin D on a regular basis.  In the past she had vitamin D deficiency.  She is interested in other treatment options. She also gives history of degenerative disc disease of cervical spine.  She states that she sees Dr. Ethelene Halamos.  She has been placed on gabapentin for disc disease.  She also does take Celebrex for pain.  She continues to have neck discomfort and some headaches related to the neck pain.  She has occasional discomfort in her hands and her left knee.  She denies any joint swelling.  Patient reports that she has taken frequent courses of steroids over the years due to sinus infections, and asthma  Activities of Daily Living:  Patient reports morning stiffness for 0 minutes.   Patient Reports nocturnal pain.  Difficulty dressing/grooming: Denies Difficulty climbing stairs: Denies Difficulty getting out of chair: Denies Difficulty using hands for taps, buttons, cutlery, and/or writing: Denies  Review of Systems  Constitutional: Negative for fatigue.  HENT: Positive for mouth dryness. Negative for mouth sores and nose dryness.   Eyes: Negative for itching and dryness.  Respiratory:  Negative for shortness of breath and wheezing.   Cardiovascular: Negative for chest pain and swelling in legs/feet.  Gastrointestinal: Negative for abdominal pain, blood in stool, constipation and diarrhea.  Endocrine: Negative for increased urination.  Genitourinary: Negative for painful urination.  Musculoskeletal: Positive for arthralgias and joint pain. Negative for joint swelling and morning stiffness.  Skin: Negative for rash and redness.  Allergic/Immunologic: Negative for susceptible to infections.  Neurological: Positive for headaches. Negative for dizziness, light-headedness, memory loss and weakness.  Hematological: Negative for bruising/bleeding tendency.  Psychiatric/Behavioral: Positive for sleep disturbance. Negative for confusion.    PMFS History:  Patient Active Problem List   Diagnosis Date Noted  . DDD (degenerative disc disease), cervical 12/15/2018  . Dyslipidemia 12/15/2018  . History of asthma 12/15/2018  . 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 Unspecified vitamin D deficiency 08/06/2013  . Osteoporosis, unspecified 08/04/2012  . 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 Anemia    diet related  . Asthma   . Atrophic vaginitis   . Colitis   . Hx of endometriosis    took Danacrine  . Hypercholesterolemia   . Osteoporosis    spine off Actonel sinsce 09/2010- Drug [facility]  . Premature ovarian failure age 78   HRT age 78 - 12/2000    Family History  Problem Relation Age of  Onset  . Prostate cancer Father 52  . Alzheimer's disease Mother 75  . Other Sister        dizziness  . Cancer Sister 50       Brain and Lung  . Ulcerative colitis Sister   . Migraines Sister   . Cancer - Colon Maternal Grandfather 36  . Irritable bowel syndrome Daughter    Past Surgical History:  Procedure Laterality Date  . HYSTEROSCOPY  1996   DUB--wnl  . NASAL SINUS SURGERY  40's and 50's  . TUBAL LIGATION Bilateral 1982    Social History   Social History Narrative  . Not on file   Immunization History  Administered Date(s) Administered  . Influenza,inj,Quad PF,6+ Mos 02/26/2014  . Pneumococcal Conjugate-13 05/29/2015  . Pneumococcal Polysaccharide-23 03/04/2013  . Tdap 09/29/2015  . Zoster 05/07/2007     Objective: Vital Signs: BP 140/77 (BP Location: Right Arm, Patient Position: Sitting, Cuff Size: Normal)   Pulse 71   Resp 12   Ht 5\' 1"  (1.549 m)   Wt 104 lb (47.2 kg)   LMP 05/07/1982 (Approximate)   BMI 19.65 kg/m    Physical Exam Vitals signs and nursing note reviewed.  Constitutional:      Appearance: She is well-developed.  HENT:     Head: Normocephalic and atraumatic.  Eyes:     Conjunctiva/sclera: Conjunctivae normal.  Neck:     Musculoskeletal: Normal range of motion.  Cardiovascular:     Rate and Rhythm: Normal rate and regular rhythm.     Heart sounds: Normal heart sounds.  Pulmonary:     Effort: Pulmonary effort is normal.     Breath sounds: Normal breath sounds.  Abdominal:     General: Bowel sounds are normal.     Palpations: Abdomen is soft.  Lymphadenopathy:     Cervical: No cervical adenopathy.  Skin:    General: Skin is warm and dry.     Capillary Refill: Capillary refill takes less than 2 seconds.  Neurological:     Mental Status: She is alert and oriented to person, place, and time.  Psychiatric:        Behavior: Behavior normal.      Musculoskeletal Exam: Patient has very limited range of motion of her cervical spine with discomfort.  She has thoracic kyphosis.  Shoulder joints, elbow joints, wrist joints with good range of motion.  She has bilateral CMC PIP and DIP thickening.  She has good range of motion of her hip joints and knee joints.  Some DIP and PIP thickening in her feet was also noted consistent with osteoarthritis.  CDAI Exam: CDAI Score: - Patient Global: -; Provider Global: - Swollen: -; Tender: - Joint Exam   No joint exam has been  documented for this visit   There is currently no information documented on the homunculus. Go to the Rheumatology activity and complete the homunculus joint exam.  Investigation: No additional findings.  Imaging: No results found.  Recent Labs: Lab Results  Component Value Date   WBC 8.5 10/31/2017   HGB 12.0 10/31/2017   PLT 292 10/31/2017   NA 141 10/31/2017   K 4.4 10/31/2017   CL 102 10/31/2017   CO2 21 10/31/2017   GLUCOSE 139 (H) 10/31/2017   BUN 24 10/31/2017   CREATININE 0.87 10/31/2017   BILITOT 0.5 10/31/2017   ALKPHOS 84 10/31/2017   AST 25 10/31/2017   ALT 23 10/31/2017   PROT 5.9 (L) 10/31/2017   ALBUMIN 4.3  10/31/2017   CALCIUM 9.2 10/31/2017   GFRAA 75 10/31/2017    Speciality Comments: No specialty comments available.  Procedures:  No procedures performed Allergies: Cephalexin, Erythromycin base, Hydrocodone-homatropine, Levofloxacin, Penicillins, Prednisone, Sulfamethoxazole-trimethoprim, and Augmentin [amoxicillin-pot clavulanate]   Assessment / Plan:     Visit Diagnoses: Age-related osteoporosis without current pathological fracture - 10/09/17: AP spine BMD 0.835 w/ T-score -2.8.  compared with BMD from 08/24/15 BMD total mean shows statistically significant.  Patient has tried Actonel in the past and had good response.  She tried Actonel now after several years later and could not tolerate it due to severe gastroesophageal reflux.  She has been off Actonel for 6 weeks now.  Different treatment options and their side effects were discussed.  Patient wants to proceed with her Prolia injections.  We will apply for it.  Following labs will be obtained today.  Plan: Parathyroid hormone, intact (no Ca), VITAMIN D 25 Hydroxy (Vit-D Deficiency, Fractures), Calcium, Phosphorus, TSH,   Vitamin D deficiency - 10/31/17: vitamin D 40.8 and phosphorus 3.7 - Plan: VITAMIN D 25 Hydroxy (Vit-D Deficiency, Fractures), she is taking calcium and vitamin D on a regular basis.   DDD (degenerative disc disease), cervical -she has limited range of motion chronic discomfort.  She has been followed by Dr. Ethelene Halamos.  She is on gabapentin.  She states she has been advised injections but she declined.  She believes that she has headaches related to her cervical spine.  Primary osteoarthritis of both hands -she complains of chronic discomfort in her hands.  She has severe osteoarthritis in her hands involving PIP DIP and CMC joints.  Joint protection was discussed.  Medication monitoring encounter - Plan: CBC with Differential/Platelet, COMPLETE METABOLIC PANEL WITH GFR,   Gastroesophageal reflux disease with esophagitis -I advised her to avoid anti-inflammatories.  She takes Celebrex on a regular basis.  Postmenopausal atrophic vaginitis   Premature ovarian failure   History of asthma -patient gives history of infrequent use of steroids due to asthma flare.  She also has taken steroids for sinusitis flare.  Dyslipidemia   Orders: Orders Placed This Encounter  Procedures  . Parathyroid hormone, intact (no Ca)  . VITAMIN D 25 Hydroxy (Vit-D Deficiency, Fractures)  . Calcium  . Phosphorus  . TSH  . CBC with Differential/Platelet  . COMPLETE METABOLIC PANEL WITH GFR   No orders of the defined types were placed in this encounter.   Face-to-face time spent with patient was 45 minutes. Greater than 50% of time was spent in counseling and coordination of care.  Follow-Up Instructions: Return for Osteoporosis,DDD.   Megan SavoyShaili Roneshia Drew, MD  Note - This record has been created using Animal nutritionistDragon software.  Chart creation errors have been sought, but may not always  have been located. Such creation errors do not reflect on  the standard of medical care.

## 2018-12-15 ENCOUNTER — Encounter: Payer: Self-pay | Admitting: Rheumatology

## 2018-12-15 ENCOUNTER — Other Ambulatory Visit: Payer: Self-pay

## 2018-12-15 ENCOUNTER — Ambulatory Visit: Payer: Medicare Other | Admitting: Rheumatology

## 2018-12-15 ENCOUNTER — Telehealth: Payer: Self-pay | Admitting: Pharmacist

## 2018-12-15 VITALS — BP 140/77 | HR 71 | Resp 12 | Ht 61.0 in | Wt 104.0 lb

## 2018-12-15 DIAGNOSIS — E288 Other ovarian dysfunction: Secondary | ICD-10-CM

## 2018-12-15 DIAGNOSIS — M5030 Other cervical disc degeneration, unspecified cervical region: Secondary | ICD-10-CM | POA: Diagnosis not present

## 2018-12-15 DIAGNOSIS — M19041 Primary osteoarthritis, right hand: Secondary | ICD-10-CM

## 2018-12-15 DIAGNOSIS — E2839 Other primary ovarian failure: Secondary | ICD-10-CM

## 2018-12-15 DIAGNOSIS — K2100 Gastro-esophageal reflux disease with esophagitis, without bleeding: Secondary | ICD-10-CM

## 2018-12-15 DIAGNOSIS — Z8709 Personal history of other diseases of the respiratory system: Secondary | ICD-10-CM

## 2018-12-15 DIAGNOSIS — M19042 Primary osteoarthritis, left hand: Secondary | ICD-10-CM

## 2018-12-15 DIAGNOSIS — M810 Age-related osteoporosis without current pathological fracture: Secondary | ICD-10-CM | POA: Diagnosis not present

## 2018-12-15 DIAGNOSIS — N952 Postmenopausal atrophic vaginitis: Secondary | ICD-10-CM

## 2018-12-15 DIAGNOSIS — E559 Vitamin D deficiency, unspecified: Secondary | ICD-10-CM

## 2018-12-15 DIAGNOSIS — Z5181 Encounter for therapeutic drug level monitoring: Secondary | ICD-10-CM

## 2018-12-15 DIAGNOSIS — R5383 Other fatigue: Secondary | ICD-10-CM

## 2018-12-15 DIAGNOSIS — E785 Hyperlipidemia, unspecified: Secondary | ICD-10-CM

## 2018-12-15 NOTE — Progress Notes (Signed)
Pharmacy Note  Subjective:  Patient presents today to the [facility] to see Dr. Estanislado Pandy.  Patient was seen by the pharmacist for counseling on Prolia. She was previously treated with Actonel of unknown duration.  She discontinued Actonel due to severe acid reflux. Last DEXA on 10/09/2017 showed T-score of -2.8 with -4.8% change in BMD.  Objective: CMP     Component Value Date/Time   NA 141 10/31/2017 1415   K 4.4 10/31/2017 1415   CL 102 10/31/2017 1415   CO2 21 10/31/2017 1415   GLUCOSE 139 (H) 10/31/2017 1415   BUN 24 10/31/2017 1415   CREATININE 0.87 10/31/2017 1415   CALCIUM 9.2 10/31/2017 1415   PROT 5.9 (L) 10/31/2017 1415   ALBUMIN 4.3 10/31/2017 1415   AST 25 10/31/2017 1415   ALT 23 10/31/2017 1415   ALKPHOS 84 10/31/2017 1415   BILITOT 0.5 10/31/2017 1415   GFRNONAA 65 10/31/2017 1415   GFRAA 75 10/31/2017 1415    Vitamin D Lab Results  Component Value Date   VD25OH 40.8 10/31/2017   T-score: -2.8 10/09/2017    Assessment/Plan:  Counseled patient on purpose, proper use, and adverse effects of Prolia.  Counseled patient that Prolia is a medication that must be injected every 6 months by a healthcare professional.  Advised patient to take calcium 1200 mg daily and vitamin D 800 units daily.  Reviewed the most common adverse effects of Prolia including risk of infection, osteonecrosis of the jaw, rash, and muscle/bone pain.  Patient confirms she does not have any major dental work planned at this time.  Reviewed with patient the signs/symptoms of low calcium and advised patient to alert [place] if she experiences these symptoms.  Provided patient with medication education material and answered all questions. Will apply for Prolia through patient's insurance.  All questions encouraged and answered.  Instructed patient to call with any further questions or concerns.  Mariella Saa, PharmD, [place], [place] Clinical Specialty Pharmacist [phone]  12/15/2018 11:12  AM

## 2018-12-15 NOTE — Telephone Encounter (Signed)
Findings of benefits investigation via test claims at [facility] for [place] :  Insurance: [place] - # 1 pen for a 180 DAY supply through patient's insurance is $ 128. No prior authorization required.  There are no grants open at this time. If needed, patient could apply for PAP through [place].  12:46 PM Beatriz Chancellor, CPhT

## 2018-12-15 NOTE — Telephone Encounter (Signed)
Please start BIV for Prolia. She was previously treated with Actonel of unknown duration.  She discontinued Actonel due to severe acid reflux. Last DEXA on 10/09/2017 showed T-score of -2.8 with -4.8% change in BMD.

## 2018-12-15 NOTE — Patient Instructions (Signed)
Will follow up about insurance coverage for Prolia.  Standing Labs We placed an order today for your standing lab work.    Please come back and get your standing labs 10 days prior to Prolia injection and 10 days post Prolia injections.  We have open lab daily Monday through Thursday from 8:30-12:30 PM and 1:30-4:30 PM and Friday from 8:30-12:30 PM and 1:30 -4:00 PM at the office of Dr. Bo Merino.   You may experience shorter wait times on Monday and Friday afternoons. The office is located at [address], [place], [place], [place] [address] No appointment is necessary.   Labs are drawn by [facility].  You may receive a bill from [facility] for your lab work.  If you wish to have your labs drawn at another location, please call the office 24 hours in advance to send orders.  If you have any questions regarding directions or hours of operation,  please call [phone].   Just as a reminder please drink plenty of water prior to coming for your lab work. Thanks!

## 2018-12-16 LAB — COMPLETE METABOLIC PANEL WITH GFR
AG Ratio: 2.3 (calc) (ref 1.0–2.5)
ALT: 19 U/L (ref 6–29)
AST: 24 U/L (ref 10–35)
Albumin: 4.3 g/dL (ref 3.6–5.1)
Alkaline phosphatase (APISO): 50 U/L (ref 37–153)
BUN: 13 mg/dL (ref 7–25)
CO2: 27 mmol/L (ref 20–32)
Calcium: 9.3 mg/dL (ref 8.6–10.4)
Chloride: 101 mmol/L (ref 98–110)
Creat: 0.6 mg/dL (ref 0.60–0.93)
GFR, Est African American: 103 mL/min/{1.73_m2} (ref >=60)
GFR, Est Non African American: 89 mL/min/{1.73_m2} (ref >=60)
Globulin: 1.9 g/dL (calc) (ref 1.9–3.7)
Glucose, Bld: 90 mg/dL (ref 65–99)
Potassium: 4.5 mmol/L (ref 3.5–5.3)
Sodium: 136 mmol/L (ref 135–146)
Total Bilirubin: 0.8 mg/dL (ref 0.2–1.2)
Total Protein: 6.2 g/dL (ref 6.1–8.1)

## 2018-12-16 LAB — CBC WITH DIFFERENTIAL/PLATELET
Absolute Monocytes: 566 cells/uL (ref 200–950)
Basophils Absolute: 52 cells/uL (ref 0–200)
Basophils Relative: 0.8 %
Eosinophils Absolute: 78 cells/uL (ref 15–500)
Eosinophils Relative: 1.2 %
HCT: 39.7 % (ref 35.0–45.0)
Hemoglobin: 13.3 g/dL (ref 11.7–15.5)
Lymphs Abs: 884 cells/uL (ref 850–3900)
MCH: 31.1 pg (ref 27.0–33.0)
MCHC: 33.5 g/dL (ref 32.0–36.0)
MCV: 92.8 fL (ref 80.0–100.0)
MPV: 10.3 fL (ref 7.5–12.5)
Monocytes Relative: 8.7 %
Neutro Abs: 4921 cells/uL (ref 1500–7800)
Neutrophils Relative %: 75.7 %
Platelets: 251 10*3/uL (ref 140–400)
RBC: 4.28 10*6/uL (ref 3.80–5.10)
RDW: 13.4 % (ref 11.0–15.0)
Total Lymphocyte: 13.6 %
WBC: 6.5 10*3/uL (ref 3.8–10.8)

## 2018-12-16 LAB — VITAMIN D 25 HYDROXY (VIT D DEFICIENCY, FRACTURES): Vit D, 25-Hydroxy: 30 ng/mL (ref 30–100)

## 2018-12-16 LAB — TSH: TSH: 3.06 mIU/L (ref 0.40–4.50)

## 2018-12-16 LAB — PHOSPHORUS: Phosphorus: 3.5 mg/dL (ref 2.1–4.3)

## 2018-12-16 LAB — PARATHYROID HORMONE, INTACT (NO CA): PTH: 34 pg/mL (ref 14–64)

## 2018-12-16 NOTE — Progress Notes (Signed)
Vitamin D is low normal.  Patient should take vitamin D 1000 units a day.

## 2018-12-16 NOTE — Telephone Encounter (Signed)
Left voicemail to notify of approval and co-pay.

## 2018-12-17 MED ORDER — DENOSUMAB 60 MG/ML ~~LOC~~ SOSY: 60.0000 mg | PREFILLED_SYRINGE | SUBCUTANEOUS | 0 refills | Status: DC

## 2018-12-17 NOTE — Telephone Encounter (Signed)
Patient's Prolia shipment to Clinic has been scheduled for Monday 12/22/18.

## 2018-12-17 NOTE — Telephone Encounter (Signed)
Patient returned call.  She is able to afford the co-pay.  She had labs on 8/10 and they were normal.  Okay to proceed with Prolia.    Prescription will be sent to [facility].  She is aware they will call her to set up with the pharmacy.  She was scheduled an appointment for Prolia administration on 8/19 at 10am.  All questions encouraged and answered.  Instructed to call with any other questions or concerns.   Mariella Saa, PharmD, [place], [place] Clinical Specialty Pharmacist [phone]  12/17/2018 11:25 AM

## 2018-12-19 MED FILL — PROLIA 60 MG/ML SOLN: 60 | 180 days supply | Qty: 1 | Fill #0

## 2018-12-22 NOTE — Telephone Encounter (Signed)
Received patient Prolia injection from [facility].  Placed in [place].  Appointment 12/24/18 at 10am.

## 2018-12-24 ENCOUNTER — Other Ambulatory Visit: Payer: Self-pay

## 2018-12-24 ENCOUNTER — Ambulatory Visit (INDEPENDENT_AMBULATORY_CARE_PROVIDER_SITE_OTHER): Payer: Medicare Other | Admitting: *Deleted

## 2018-12-24 VITALS — BP 116/79 | HR 70

## 2018-12-24 DIAGNOSIS — M810 Age-related osteoporosis without current pathological fracture: Secondary | ICD-10-CM

## 2018-12-24 MED ORDER — DENOSUMAB 60 MG/ML ~~LOC~~ SOSY
60.0000 mg | PREFILLED_SYRINGE | Freq: Once | SUBCUTANEOUS | Status: AC
  Administered 2018-12-24: 60 mg via SUBCUTANEOUS

## 2018-12-24 NOTE — Patient Instructions (Addendum)
Continue taking your calcium and vitamin D supplement daily.  Standing Labs We placed an order today for your standing lab work.    Please come back and get your standing labs on 01/02/2019.  We have open lab daily Monday through Thursday from 8:30-12:30 PM and 1:30-4:30 PM and Friday from 8:30-12:30 PM and 1:30 -4:00 PM at the office of Dr. Bo Merino.   You may experience shorter wait times on Monday and Friday afternoons. The office is located at [address] [place], [place], [place] [address] No appointment is necessary.   Labs are drawn by [facility].  You may receive a bill from [facility] for your lab work.  If you wish to have your labs drawn at another location, please call the office 24 hours in advance to send orders.  If you have any questions regarding directions or hours of operation,  please call [phone].   Just as a reminder please drink plenty of water prior to coming for your lab work. Thanks!

## 2018-12-24 NOTE — Progress Notes (Signed)
Patient in office for a Prolia injection. Patient was given injection in her left arm. Patient tolerated injection well. Patient monitored in the office for 30 minutes for adverse reactions.   Administrations This Visit    denosumab (PROLIA) injection 60 mg    Admin Date 12/24/2018 Action Given Dose 60 mg Route Subcutaneous Administered By Carole Binning, LPN

## 2019-01-02 ENCOUNTER — Other Ambulatory Visit: Payer: Self-pay

## 2019-01-02 DIAGNOSIS — M810 Age-related osteoporosis without current pathological fracture: Secondary | ICD-10-CM

## 2019-01-03 LAB — CBC WITH DIFFERENTIAL/PLATELET
Absolute Monocytes: 628 cells/uL (ref 200–950)
Basophils Absolute: 41 cells/uL (ref 0–200)
Basophils Relative: 0.6 %
Eosinophils Absolute: 48 cells/uL (ref 15–500)
Eosinophils Relative: 0.7 %
HCT: 36.6 % (ref 35.0–45.0)
Hemoglobin: 12.2 g/dL (ref 11.7–15.5)
Lymphs Abs: 1076 cells/uL (ref 850–3900)
MCH: 30.8 pg (ref 27.0–33.0)
MCHC: 33.3 g/dL (ref 32.0–36.0)
MCV: 92.4 fL (ref 80.0–100.0)
MPV: 10 fL (ref 7.5–12.5)
Monocytes Relative: 9.1 %
Neutro Abs: 5106 cells/uL (ref 1500–7800)
Neutrophils Relative %: 74 %
Platelets: 297 10*3/uL (ref 140–400)
RBC: 3.96 10*6/uL (ref 3.80–5.10)
RDW: 12.9 % (ref 11.0–15.0)
Total Lymphocyte: 15.6 %
WBC: 6.9 10*3/uL (ref 3.8–10.8)

## 2019-01-03 LAB — COMPLETE METABOLIC PANEL WITH GFR
AG Ratio: 2.7 (calc) — ABNORMAL HIGH (ref 1.0–2.5)
ALT: 13 U/L (ref 6–29)
AST: 22 U/L (ref 10–35)
Albumin: 4.1 g/dL (ref 3.6–5.1)
Alkaline phosphatase (APISO): 58 U/L (ref 37–153)
BUN: 16 mg/dL (ref 7–25)
CO2: 26 mmol/L (ref 20–32)
Calcium: 9.1 mg/dL (ref 8.6–10.4)
Chloride: 102 mmol/L (ref 98–110)
Creat: 0.7 mg/dL (ref 0.60–0.93)
GFR, Est African American: 98 mL/min/{1.73_m2} (ref >=60)
GFR, Est Non African American: 84 mL/min/{1.73_m2} (ref >=60)
Globulin: 1.5 g/dL (calc) — ABNORMAL LOW (ref 1.9–3.7)
Glucose, Bld: 104 mg/dL — ABNORMAL HIGH (ref 65–99)
Potassium: 4.6 mmol/L (ref 3.5–5.3)
Sodium: 136 mmol/L (ref 135–146)
Total Bilirubin: 0.6 mg/dL (ref 0.2–1.2)
Total Protein: 5.6 g/dL — ABNORMAL LOW (ref 6.1–8.1)

## 2019-01-05 NOTE — Progress Notes (Signed)
Labs are stable.

## 2019-01-05 NOTE — Progress Notes (Signed)
Office Visit Note  Patient: Megan Logan             Date of Birth: 12-Dec-1940           MRN: [id number]             PCP: Barbie Banner, MD Referring: Barbie Banner, MD Visit Date: 01/19/2019 Occupation: @GUAROCC @  Subjective:  Neck pain    History of Present Illness: Megan Logan is a 78 y.o. female with history of osteoporosis, osteoarthritis, and DDD.  Patient had her first Prolia 60 mg subcutaneous injection on 12/24/2018.  She tolerated the injection without any injection site reaction.  She states that she has been having intermittent and fleeting shin pain bilaterally.  She denies any thigh pain.  She denies any recent falls or fractures.  She continues to take calcium and vitamin D daily. She has chronic pain in bilateral hands.  She experiences stiffness especially when trying to make a complete fist.  She denies any joint swelling at this time.  She continues to have chronic right knee joint pain.  She states that she experiences a locking sensation at times.  She denies any swelling in the right knee joint.  She has chronic and severe neck pain.  She denies any symptoms of radiculopathy.  She has not had any upper extremity weakness.  She takes gabapentin 300 mg by mouth at bedtime which helps her sleep.  She continues to have nocturnal pain in her neck.  She has been evaluated by Dr. ramus in the past but has not wanted to proceed with injections at this time.  She has been to physical therapy and had dry needling but did not notice much improvement.  She takes Tylenol when the pain is severe.   Activities of Daily Living:  Patient reports morning stiffness for a few minutes.   Patient Reports nocturnal pain.  Difficulty dressing/grooming: Denies Difficulty climbing stairs: Denies Difficulty getting out of chair: Denies Difficulty using hands for taps, buttons, cutlery, and/or writing: Denies  Review of Systems  Constitutional: Negative for fatigue.  HENT: Negative  for mouth sores, mouth dryness and nose dryness.   Eyes: Negative for pain, itching, visual disturbance and dryness.  Respiratory: Negative for cough, hemoptysis, shortness of breath and difficulty breathing.   Cardiovascular: Negative for chest pain, palpitations, hypertension and swelling in legs/feet.  Gastrointestinal: Positive for constipation. Negative for blood in stool and diarrhea.  Endocrine: Negative for increased urination.  Genitourinary: Negative for difficulty urinating and painful urination.  Musculoskeletal: Positive for arthralgias, joint pain, joint swelling and morning stiffness. Negative for myalgias, muscle weakness, muscle tenderness and myalgias.  Skin: Negative for color change, pallor, rash, hair loss, nodules/bumps, redness, skin tightness, ulcers and sensitivity to sunlight.  Allergic/Immunologic: Negative for susceptible to infections.  Neurological: Positive for headaches. Negative for dizziness, light-headedness, numbness, memory loss and weakness.  Hematological: Negative for swollen glands.  Psychiatric/Behavioral: Negative for depressed mood, confusion and sleep disturbance. The patient is not nervous/anxious.     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   Unspecified vitamin D deficiency 08/06/2013   Age-related osteoporosis without current pathological fracture 08/04/2012   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    Past Medical History:  Diagnosis Date   Anemia    diet related   Asthma  Atrophic vaginitis    Colitis    Hx of endometriosis    took Danacrine   Hypercholesterolemia    Osteoporosis    spine off Actonel sinsce 09/2010- Drug [facility]   Premature ovarian failure age 89   HRT age 78 - 12/2000    Family History  Problem Relation Age of  Onset   Prostate cancer Father 4974   Alzheimer's disease Mother 4488   Other Sister        dizziness   Cancer Sister 2678       Brain and Lung   Ulcerative colitis Sister    Migraines Sister    Cancer - Colon Maternal Grandfather [id number]   Irritable bowel syndrome Daughter    Past Surgical History:  Procedure Laterality Date   HYSTEROSCOPY  1996   DUB--wnl   NASAL SINUS SURGERY  40's and 50's   TUBAL LIGATION Bilateral 1982   Social History   Social History Narrative   Not on file   Immunization History  Administered Date(s) Administered   Influenza,inj,Quad PF,6+ Mos 02/26/2014   Pneumococcal Conjugate-13 05/29/2015   Pneumococcal Polysaccharide-23 03/04/2013   Tdap 09/29/2015   Zoster 05/07/2007     Objective: Vital Signs: BP 126/78 (BP Location: Left Arm, Patient Position: Sitting, Cuff Size: Normal)    Pulse 70    Resp 12    Ht 5\' 1"  (1.549 m)    Wt 105 lb (47.6 kg)    LMP 05/07/1982 (Approximate)    BMI 19.84 kg/m    Physical Exam Vitals signs and nursing note reviewed.  Constitutional:      Appearance: She is well-developed.  HENT:     Head: Normocephalic and atraumatic.  Eyes:     Conjunctiva/sclera: Conjunctivae normal.  Neck:     Musculoskeletal: Normal range of motion.  Cardiovascular:     Rate and Rhythm: Normal rate and regular rhythm.     Heart sounds: Normal heart sounds.  Pulmonary:     Effort: Pulmonary effort is normal.     Breath sounds: Normal breath sounds.  Abdominal:     General: Bowel sounds are normal.     Palpations: Abdomen is soft.  Lymphadenopathy:     Cervical: No cervical adenopathy.  Skin:    General: Skin is warm and dry.     Capillary Refill: Capillary refill takes less than 2 seconds.  Neurological:     Mental Status: She is alert and oriented to person, place, and time.  Psychiatric:        Behavior: Behavior normal.      Musculoskeletal Exam: C-spine limited range of motion with discomfort.  Thoracic and  lumbar spine good range of motion.  No midline spinal tenderness.  No SI joint tenderness.  Shoulder joints, elbow joints, wrist joints, MCPs, PIPs, DIPs good range of motion no synovitis.  She has PIP and DIP synovial thickening consistent with osteoarthritis of bilateral hands.  She has CMC joint prominence bilaterally.  Hip joints have good range of motion with no discomfort.  Right knee discomfort with range of motion.  She has right knee warmth but no effusion.  Left knee has good range of motion with no warmth or effusion.  Ankle joints, MTPs, PIPs, DIPs good range of motion with no synovitis.  No tenderness or swelling of ankle joints.  CDAI Exam: CDAI Score: -- Patient Global: --; Provider Global: -- Swollen: --; Tender: -- Joint Exam   No joint exam has been documented for this visit  There is currently no information documented on the homunculus. Go to the Rheumatology activity and complete the homunculus joint exam.  Investigation: No additional findings.  Imaging: Mm 3d Screen Breast Bilateral  Result Date: 01/15/2019 CLINICAL DATA:  Screening. EXAM: DIGITAL SCREENING BILATERAL MAMMOGRAM WITH TOMO AND CAD COMPARISON:  Previous exam(s). ACR Breast Density Category c: The breast tissue is heterogeneously dense, which may obscure small masses. FINDINGS: There are no findings suspicious for malignancy. Images were processed with CAD. IMPRESSION: No mammographic evidence of malignancy. A result letter of this screening mammogram will be mailed directly to the patient. RECOMMENDATION: Screening mammogram in one year. (Code:SM-B-01Y) BI-RADS CATEGORY  1: Negative. Electronically Signed   By: Edwin CapJennifer  Jarosz M.D.   On: 01/15/2019 15:26    Recent Labs: Lab Results  Component Value Date   WBC 6.9 01/02/2019   HGB 12.2 01/02/2019   PLT 297 01/02/2019   NA 136 01/02/2019   K 4.6 01/02/2019   CL 102 01/02/2019   CO2 26 01/02/2019   GLUCOSE 104 (H) 01/02/2019   BUN 16 01/02/2019    CREATININE 0.70 01/02/2019   BILITOT 0.6 01/02/2019   ALKPHOS 84 10/31/2017   AST 22 01/02/2019   ALT 13 01/02/2019   PROT 5.6 (L) 01/02/2019   ALBUMIN 4.3 10/31/2017   CALCIUM 9.1 01/02/2019   GFRAA 98 01/02/2019  December 15, 2018 PTH 34 normal, vitamin D 30, phosphorus 3.5, TSH normal  Speciality Comments: No specialty comments available.  Procedures:  No procedures performed Allergies: Cephalexin, Erythromycin base, Hydrocodone-homatropine, Levofloxacin, Penicillins, Prednisone, Sulfamethoxazole-trimethoprim, and Augmentin [amoxicillin-pot clavulanate]   Assessment / Plan:     Visit Diagnoses: Age-related osteoporosis without current pathological fracture - She is on Prolia 60 mg every 6 months started on 12/24/2018. She tolerated the injection without any side effects or injection site reaction. Lab work was stable on 01/02/19.  She continues to take calcium/vitamin D supplements daily.  Vitamin D was 30 on 12/15/2018.  She has not had any recent falls or fractures.  She was previously treated with Actonel of unknown duration.  She discontinued Actonel due to severe acid reflux. Last DEXA on 10/09/2017 showed T-score of -2.8 with -4.8% change in BMD.  Due for repeat DEXA June 2021.  Next Prolia injection due February 2021.  She will follow up in 6 months.   Vitamin D deficiency: Vitamin D was 30 on 12/15/2018.  She is taking a vitamin D supplement daily.  DDD (degenerative disc disease), cervical - Followed by Dr. Ethelene Halamos.  She has very limited range of motion with discomfort on exam.  She has no symptoms of radiculopathy at this time.  She is been home having more difficulty due to limited range of motion.  She takes gabapentin 300 mg by mouth at bedtime which helps with the discomfort and helps her sleep at night.  She takes Tylenol if the pain is severe.  She is apprehensive to proceed with an injection by Dr. ramus at this time.  She has been to physical therapy in the past and has had dry  needling performed but did not notice much improvement.  Primary osteoarthritis of both hands: She has PIP and DIP synovial thickening consistent with osteoarthritis of bilateral hands.  She has chronic pain in both hands and experiences stiffness when trying to make a complete fist.  She has no difficulties with ADLs at this time.  Joint protection and muscle strengthening were discussed.  Chronic pain of right knee: She painful ROM on  exam.  Right knee crepitus and warmth palpated.  She declined a cortisone injection.  She experiences an occasional locking sensation.  We dicussed further imaging but she declined at this time.  Other medical conditions are listed as follows:   Gastroesophageal reflux disease with esophagitis  History of asthma - History of infrequent use of steroids for asthma flares and recurrent sinusitis.  Dyslipidemia  Other fatigue  Orders: No orders of the defined types were placed in this encounter.  No orders of the defined types were placed in this encounter.   Follow-Up Instructions: Return in about 6 months (around 07/19/2019) for Osteoporosis, Osteoarthritis, DDD.   Ofilia Neas, PA-C   I examined and evaluated the patient with Hazel Sams PA.  Patient has been tolerating Prolia well.  She was complaining of some shin pain which may be related to Prolia or could be osteoarthritis.  She is also having knee joint discomfort.  I offered cortisone injection but she declined.  She will let us know if she needs cortisone injection in the future.  Muscle strengthening exercises were emphasized.  She has been doing some exercises on a routine basis.  The plan of care was discussed as noted above.  Bo Merino, MD  Note - This record has been created using Editor, commissioning.  Chart creation errors have been sought, but may not always  have been located. Such creation errors do not reflect on  the standard of medical care.

## 2019-01-15 ENCOUNTER — Ambulatory Visit: Payer: Medicare Other | Admitting: Rheumatology

## 2019-01-15 ENCOUNTER — Ambulatory Visit (HOSPITAL_COMMUNITY)
Admission: RE | Admit: 2019-01-15 | Discharge: 2019-01-15 | Disposition: A | Discharge status: Home / Self Care | Payer: Medicare Other | Source: Ambulatory Visit | Attending: Obstetrics and Gynecology | Admitting: Obstetrics and Gynecology

## 2019-01-15 ENCOUNTER — Other Ambulatory Visit: Payer: Self-pay

## 2019-01-15 DIAGNOSIS — Z1231 Encounter for screening mammogram for malignant neoplasm of breast: Secondary | ICD-10-CM | POA: Diagnosis present

## 2019-01-19 ENCOUNTER — Other Ambulatory Visit: Payer: Self-pay

## 2019-01-19 ENCOUNTER — Ambulatory Visit (INDEPENDENT_AMBULATORY_CARE_PROVIDER_SITE_OTHER): Payer: Medicare Other | Admitting: Rheumatology

## 2019-01-19 ENCOUNTER — Encounter: Payer: Self-pay | Admitting: Rheumatology

## 2019-01-19 VITALS — BP 126/78 | HR 70 | Resp 12 | Ht 61.0 in | Wt 105.0 lb

## 2019-01-19 DIAGNOSIS — M19041 Primary osteoarthritis, right hand: Secondary | ICD-10-CM

## 2019-01-19 DIAGNOSIS — G8929 Other chronic pain: Secondary | ICD-10-CM

## 2019-01-19 DIAGNOSIS — M5030 Other cervical disc degeneration, unspecified cervical region: Secondary | ICD-10-CM

## 2019-01-19 DIAGNOSIS — E559 Vitamin D deficiency, unspecified: Secondary | ICD-10-CM | POA: Diagnosis not present

## 2019-01-19 DIAGNOSIS — M810 Age-related osteoporosis without current pathological fracture: Secondary | ICD-10-CM | POA: Diagnosis not present

## 2019-01-19 DIAGNOSIS — K2100 Gastro-esophageal reflux disease with esophagitis, without bleeding: Secondary | ICD-10-CM

## 2019-01-19 DIAGNOSIS — M19042 Primary osteoarthritis, left hand: Secondary | ICD-10-CM

## 2019-01-19 DIAGNOSIS — E785 Hyperlipidemia, unspecified: Secondary | ICD-10-CM

## 2019-01-19 DIAGNOSIS — Z8709 Personal history of other diseases of the respiratory system: Secondary | ICD-10-CM

## 2019-01-19 DIAGNOSIS — M25561 Pain in right knee: Secondary | ICD-10-CM

## 2019-01-19 DIAGNOSIS — R5383 Other fatigue: Secondary | ICD-10-CM

## 2019-02-19 DIAGNOSIS — M150 Primary generalized (osteo)arthritis: Secondary | ICD-10-CM | POA: Insufficient documentation

## 2019-02-19 DIAGNOSIS — J310 Chronic rhinitis: Secondary | ICD-10-CM | POA: Insufficient documentation

## 2019-02-19 DIAGNOSIS — M8949 Other hypertrophic osteoarthropathy, multiple sites: Secondary | ICD-10-CM | POA: Insufficient documentation

## 2019-02-19 DIAGNOSIS — M159 Polyosteoarthritis, unspecified: Secondary | ICD-10-CM | POA: Insufficient documentation

## 2019-05-12 ENCOUNTER — Telehealth: Payer: Self-pay | Admitting: Pharmacy Technician

## 2019-05-12 NOTE — Telephone Encounter (Signed)
Received a fax regarding Prior Authorization from [facility] for PROLIA. Authorization has been DENIED because medication must be billed under Medicare part B.Moving forward patient will need to receive injection at the hospital or Buy/bill.  Called patient to advise, she said that she is fine going to the hospital for her next injection.  Will send document to scan center.

## 2019-05-12 NOTE — Telephone Encounter (Signed)
Received letter requesting PA for Prolia in 2021  Submitted a Prior Authorization request to [facility] for PROLIA via Cover My Meds. Will update once we receive a response.

## 2019-06-09 ENCOUNTER — Telehealth: Payer: Self-pay | Admitting: Pharmacist

## 2019-06-09 NOTE — Telephone Encounter (Signed)
Courtesy call to remind patient is due for Prolia.  Patient not available spoke to husband.  He will have patient return call.  Her next injection is due on or after 06/26/2019.  Verlin Fester, PharmD, [place], CPP Clinical Specialty Pharmacist [phone]  06/09/2019 1:46 PM

## 2019-06-11 ENCOUNTER — Telehealth: Payer: Self-pay | Admitting: Rheumatology

## 2019-06-11 NOTE — Telephone Encounter (Signed)
Ran test claim, patient's Prolia copay is $50.00 through her [facility]. Patient says she is fine with copay and will come by office next week for her lab work.  1:49 PM Dorthula Nettles, CPhT

## 2019-06-11 NOTE — Telephone Encounter (Signed)
Opened in error

## 2019-06-11 NOTE — Telephone Encounter (Signed)
Patient returned your call. She has had an insurance change, and not sure where she needs to go for her Prolia injection. Please call to advise.

## 2019-06-23 ENCOUNTER — Other Ambulatory Visit: Payer: Self-pay

## 2019-06-23 DIAGNOSIS — M810 Age-related osteoporosis without current pathological fracture: Secondary | ICD-10-CM

## 2019-06-24 ENCOUNTER — Telehealth: Payer: Self-pay | Admitting: *Deleted

## 2019-06-24 DIAGNOSIS — M810 Age-related osteoporosis without current pathological fracture: Secondary | ICD-10-CM

## 2019-06-24 LAB — COMPLETE METABOLIC PANEL WITH GFR
AG Ratio: 2.7 (calc) — ABNORMAL HIGH (ref 1.0–2.5)
ALT: 14 U/L (ref 6–29)
AST: 20 U/L (ref 10–35)
Albumin: 4.1 g/dL (ref 3.6–5.1)
Alkaline phosphatase (APISO): 52 U/L (ref 37–153)
BUN: 21 mg/dL (ref 7–25)
CO2: 29 mmol/L (ref 20–32)
Calcium: 9 mg/dL (ref 8.6–10.4)
Chloride: 103 mmol/L (ref 98–110)
Creat: 0.64 mg/dL (ref 0.60–0.93)
GFR, Est African American: 100 mL/min/{1.73_m2} (ref >=60)
GFR, Est Non African American: 86 mL/min/{1.73_m2} (ref >=60)
Globulin: 1.5 g/dL (calc) — ABNORMAL LOW (ref 1.9–3.7)
Glucose, Bld: 81 mg/dL (ref 65–99)
Potassium: 4.5 mmol/L (ref 3.5–5.3)
Sodium: 137 mmol/L (ref 135–146)
Total Bilirubin: 0.7 mg/dL (ref 0.2–1.2)
Total Protein: 5.6 g/dL — ABNORMAL LOW (ref 6.1–8.1)

## 2019-06-24 LAB — CBC WITH DIFFERENTIAL/PLATELET
Absolute Monocytes: 612 cells/uL (ref 200–950)
Basophils Absolute: 48 cells/uL (ref 0–200)
Basophils Relative: 0.8 %
Eosinophils Absolute: 102 cells/uL (ref 15–500)
Eosinophils Relative: 1.7 %
HCT: 37.9 % (ref 35.0–45.0)
Hemoglobin: 12.5 g/dL (ref 11.7–15.5)
Lymphs Abs: 1134 cells/uL (ref 850–3900)
MCH: 30.9 pg (ref 27.0–33.0)
MCHC: 33 g/dL (ref 32.0–36.0)
MCV: 93.6 fL (ref 80.0–100.0)
MPV: 9.7 fL (ref 7.5–12.5)
Monocytes Relative: 10.2 %
Neutro Abs: 4104 cells/uL (ref 1500–7800)
Neutrophils Relative %: 68.4 %
Platelets: 281 10*3/uL (ref 140–400)
RBC: 4.05 10*6/uL (ref 3.80–5.10)
RDW: 12.2 % (ref 11.0–15.0)
Total Lymphocyte: 18.9 %
WBC: 6 10*3/uL (ref 3.8–10.8)

## 2019-06-24 MED ORDER — DENOSUMAB 60 MG/ML ~~LOC~~ SOSY: 60.0000 mg | PREFILLED_SYRINGE | SUBCUTANEOUS | 0 refills | Status: DC

## 2019-06-24 NOTE — Telephone Encounter (Signed)
-----   Message from Pollyann Savoy, MD sent at 06/24/2019  8:04 AM EST ----- CBC and CMP are stable.

## 2019-06-24 NOTE — Progress Notes (Signed)
CBC and CMP are stable.

## 2019-06-24 NOTE — Telephone Encounter (Signed)
Patient advised CBC and CMP are stable. Patient schedule for Prolia injection and prescription sent to the pharmacy.

## 2019-07-01 MED FILL — PROLIA 60 MG/ML SOLN: 60 | 180 days supply | Qty: 1 | Fill #0

## 2019-07-03 ENCOUNTER — Ambulatory Visit (INDEPENDENT_AMBULATORY_CARE_PROVIDER_SITE_OTHER): Payer: Medicare PPO | Admitting: *Deleted

## 2019-07-03 ENCOUNTER — Other Ambulatory Visit: Payer: Self-pay

## 2019-07-03 VITALS — BP 124/75 | HR 68

## 2019-07-03 DIAGNOSIS — M810 Age-related osteoporosis without current pathological fracture: Secondary | ICD-10-CM | POA: Diagnosis not present

## 2019-07-03 MED ORDER — DENOSUMAB 60 MG/ML ~~LOC~~ SOSY
60.0000 mg | PREFILLED_SYRINGE | Freq: Once | SUBCUTANEOUS | Status: AC
  Administered 2019-07-03: 60 mg via SUBCUTANEOUS

## 2019-07-03 NOTE — Progress Notes (Signed)
Patient in office for a Prolia injection. Patient given injection in her left arm. Patient tolerated injection well. Patient reminded to return to office in 10 days for labs.   Administrations This Visit    denosumab (PROLIA) injection 60 mg    Admin Date 07/03/2019 Action Given Dose 60 mg Route Subcutaneous Administered By Henriette Combs, LPN

## 2019-07-13 ENCOUNTER — Other Ambulatory Visit: Payer: Self-pay | Admitting: *Deleted

## 2019-07-13 DIAGNOSIS — M810 Age-related osteoporosis without current pathological fracture: Secondary | ICD-10-CM

## 2019-07-13 NOTE — Progress Notes (Signed)
Office Visit Note  Patient: Megan Logan             Date of Birth: March 15, 1941           MRN: [id number]             PCP: Christain Sacramento, MD Referring: Christain Sacramento, MD Visit Date: 07/20/2019 Occupation: @GUAROCC @  Subjective:  Pain in joints  History of Present Illness: Megan Logan is a 79 y.o. female with history of osteoarthritis, degenerative disc disease and osteoporosis.  She states she had Prolia injection in February and tolerated it well.  She has noticed increased discomfort and deformities in her fingers.  She states she is having difficulty taking of her ring from her left ring finger.  She is also had increased discomfort in her cervical region.  She states she has had 2 sets of injections by Dr. Nelva Bush.  She is having difficulty sleeping at night because of her neck pain.  Activities of Daily Living:  Patient reports morning stiffness for several hours.   Patient Reports nocturnal pain.  Difficulty dressing/grooming: Denies Difficulty climbing stairs: Denies Difficulty getting out of chair: Denies Difficulty using hands for taps, buttons, cutlery, and/or writing: Reports  Review of Systems  Constitutional: Negative for fatigue, night sweats, weight gain and weight loss.  HENT: Positive for mouth dryness. Negative for mouth sores, trouble swallowing, trouble swallowing and nose dryness.   Eyes: Negative for pain, redness, itching, visual disturbance and dryness.  Respiratory: Negative for cough, shortness of breath, wheezing and difficulty breathing.   Cardiovascular: Negative for chest pain, palpitations, hypertension, irregular heartbeat and swelling in legs/feet.  Gastrointestinal: Positive for constipation. Negative for blood in stool and diarrhea.  Endocrine: Negative for increased urination.  Genitourinary: Negative for difficulty urinating, painful urination and vaginal dryness.  Musculoskeletal: Positive for arthralgias, joint pain and morning stiffness.  Negative for joint swelling, myalgias, muscle weakness, muscle tenderness and myalgias.  Skin: Negative for color change, rash, hair loss, skin tightness, ulcers and sensitivity to sunlight.  Allergic/Immunologic: Negative for susceptible to infections.  Neurological: Negative for dizziness, numbness, headaches, memory loss, night sweats and weakness.  Hematological: Positive for bruising/bleeding tendency. Negative for swollen glands.  Psychiatric/Behavioral: Negative for depressed mood, confusion and sleep disturbance. The patient is not nervous/anxious.     PMFS History:  Patient Active Problem List   Diagnosis Date Noted  . DDD (degenerative disc disease), cervical 12/15/2018  . Dyslipidemia 12/15/2018  . History of asthma 12/15/2018  . 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 Unspecified vitamin D deficiency 08/06/2013  . Age-related osteoporosis without current pathological fracture 08/04/2012  . 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 Anemia    diet related  . Asthma   . Atrophic vaginitis   . Colitis   . Hx of endometriosis    took Danacrine  . Hypercholesterolemia   . Osteoporosis    spine off Actonel sinsce 09/2010- Drug [facility]  . Premature ovarian failure age 60   HRT age 60 - 12/2000    Family History  Problem Relation Age of Onset  . Prostate cancer Father 26  . Alzheimer's disease Mother 97  . Other Sister        dizziness  . Cancer Sister 36       Brain and Lung  . Ulcerative colitis Sister   . Migraines Sister   . Cancer - Colon Maternal Grandfather 51  . Irritable  bowel syndrome Daughter   . Colitis Daughter    Past Surgical History:  Procedure Laterality Date  . HYSTEROSCOPY  1996   DUB--wnl  . NASAL SINUS SURGERY  40's and 50's  . TUBAL LIGATION Bilateral 1982   Social History   Social History Narrative  . Not on file   Immunization History  Administered Date(s) Administered  .  Influenza,inj,Quad PF,6+ Mos 02/26/2014  . Pneumococcal Conjugate-13 05/29/2015  . Pneumococcal Polysaccharide-23 03/04/2013  . Tdap 09/29/2015  . Zoster 05/07/2007     Objective: Vital Signs: BP 136/69 (BP Location: Left Arm, Patient Position: Sitting, Cuff Size: Normal)   Pulse 69   Resp 13   Ht 5' 0.5" (1.537 m)   Wt 109 lb (49.4 kg)   LMP 05/07/1982 (Approximate)   BMI 20.94 kg/m    Physical Exam Vitals and nursing note reviewed.  Constitutional:      Appearance: She is well-developed.  HENT:     Head: Normocephalic and atraumatic.  Eyes:     Conjunctiva/sclera: Conjunctivae normal.  Cardiovascular:     Rate and Rhythm: Normal rate and regular rhythm.     Heart sounds: Normal heart sounds.  Pulmonary:     Effort: Pulmonary effort is normal.     Breath sounds: Normal breath sounds.  Abdominal:     General: Bowel sounds are normal.     Palpations: Abdomen is soft.  Musculoskeletal:     Cervical back: Normal range of motion.  Lymphadenopathy:     Cervical: No cervical adenopathy.  Skin:    General: Skin is warm and dry.     Capillary Refill: Capillary refill takes less than 2 seconds.  Neurological:     Mental Status: She is alert and oriented to person, place, and time.  Psychiatric:        Behavior: Behavior normal.      Musculoskeletal Exam: C-spine very limited range of motion.  Shoulder joints and elbow joints were in good range of motion.  She had bilateral CMC, PIP and DIP thickening.  No synovitis was noted.  She has some warmth and discomfort with range of motion of her right knee joint.  Left knee joint was in good range of motion.  She has PIP and DIP thickening in her feet.  CDAI Exam: CDAI Score: -- Patient Global: --; Provider Global: -- Swollen: --; Tender: -- Joint Exam 07/20/2019   No joint exam has been documented for this visit   There is currently no information documented on the homunculus. Go to the Rheumatology activity and complete  the homunculus joint exam.  Investigation: No additional findings.  Imaging: XR KNEE 3 VIEW RIGHT  Result Date: 07/20/2019 Moderate medial compartment narrowing was noted.  Calcification in the soft tissue medial to the joint line was noted.  Intercondylar and lateral osteophytes were noted.  Severe patellofemoral narrowing was noted.  Calcification in the popliteal region was noted. Impression: These findings are consistent with moderate osteoarthritis and severe chondromalacia patella.   Recent Labs: Lab Results  Component Value Date   WBC 5.0 07/13/2019   HGB 12.3 07/13/2019   PLT 270 07/13/2019   NA 135 07/13/2019   K 4.4 07/13/2019   CL 102 07/13/2019   CO2 27 07/13/2019   GLUCOSE 82 07/13/2019   BUN 13 07/13/2019   CREATININE 0.64 07/13/2019   BILITOT 0.6 07/13/2019   ALKPHOS 84 10/31/2017   AST 21 07/13/2019   ALT 14 07/13/2019   PROT 5.4 (L) 07/13/2019  ALBUMIN 4.3 10/31/2017   CALCIUM 8.9 07/13/2019   GFRAA 100 07/13/2019    Speciality Comments: No specialty comments available.  Procedures:  Large Joint Inj: R knee on 07/20/2019 10:21 AM Indications: pain Details: 27 G 1.5 in needle, medial approach  Arthrogram: No  Medications: 40 mg triamcinolone acetonide 40 MG/ML; 1.5 mL lidocaine 1 % Aspirate: 0 mL Outcome: tolerated well, no immediate complications Procedure, treatment alternatives, risks and benefits explained, specific risks discussed. Consent was given by the patient. Immediately prior to procedure a time out was called to verify the correct patient, procedure, equipment, support staff and site/side marked as required. Patient was prepped and draped in the usual sterile fashion.     Allergies: Cephalexin, Erythromycin base, Hydrocodone-homatropine, Levofloxacin, Penicillins, Prednisone, Sulfamethoxazole-trimethoprim, and Augmentin [amoxicillin-pot clavulanate]   Assessment / Plan:     Visit Diagnoses: Age-related osteoporosis without current  pathological fracture - d/c Actonel due to severe acid reflux. Last DEXA on 10/09/2017 showed T-score of -2.8 with -4.8% change in BMD.  Due for repeat DEXA June 2021. Prolia 07/03/19  Vitamin D deficiency-her last vitamin D level was 30 in August 2020.  Use of vitamin D a regular basis was emphasized.  Chronic pain of right knee -she has been experiencing pain and discomfort in her right knee joint.  She had warmth on palpation.  She also had discomfort range of motion.  Plan: XR KNEE 3 VIEW RIGHT knee x-ray showed moderate osteoarthritis and severe chondromalacia patella.  Different treatment options were discussed.  Per patient's request right knee joint was injected with cortisone as described above.  She tolerated the procedure well.  I have advised her to contact me in case she has persistent symptoms.  Postprocedure instructions were given.  Primary osteoarthritis of both hands-she has bilateral CMC, PIP and DIP severe thickening.  She is unable to take her rings out.  There was no synovitis on examination.  I have advised her to get the rings cut.  DDD (degenerative disc disease), cervical-she is very limited range of motion of her cervical spine.  She goes to Dr. Ethelene Hal.  She had 2 injections one in December and one in January.  She continues to have some nocturnal pain.  Other medical problems are listed as follows:  History of gastroesophageal reflux (GERD)  History of asthma  Dyslipidemia  Postmenopausal atrophic vaginitis  Premature ovarian failure  Orders: Orders Placed This Encounter  Procedures  . XR KNEE 3 VIEW RIGHT   No orders of the defined types were placed in this encounter.   Face-to-face time spent with patient was 30 minutes. Greater than 50% of time was spent in counseling and coordination of care.  Follow-Up Instructions: Return in about 6 months (around 01/20/2020) for Osteoarthritis, Osteoporosis.   Pollyann Savoy, MD  Note - This record has been created  using Animal nutritionist.  Chart creation errors have been sought, but may not always  have been located. Such creation errors do not reflect on  the standard of medical care.

## 2019-07-14 LAB — COMPLETE METABOLIC PANEL WITH GFR
AG Ratio: 2.9 (calc) — ABNORMAL HIGH (ref 1.0–2.5)
ALT: 14 U/L (ref 6–29)
AST: 21 U/L (ref 10–35)
Albumin: 4 g/dL (ref 3.6–5.1)
Alkaline phosphatase (APISO): 47 U/L (ref 37–153)
BUN: 13 mg/dL (ref 7–25)
CO2: 27 mmol/L (ref 20–32)
Calcium: 8.9 mg/dL (ref 8.6–10.4)
Chloride: 102 mmol/L (ref 98–110)
Creat: 0.64 mg/dL (ref 0.60–0.93)
GFR, Est African American: 100 mL/min/{1.73_m2} (ref >=60)
GFR, Est Non African American: 86 mL/min/{1.73_m2} (ref >=60)
Globulin: 1.4 g/dL (calc) — ABNORMAL LOW (ref 1.9–3.7)
Glucose, Bld: 82 mg/dL (ref 65–99)
Potassium: 4.4 mmol/L (ref 3.5–5.3)
Sodium: 135 mmol/L (ref 135–146)
Total Bilirubin: 0.6 mg/dL (ref 0.2–1.2)
Total Protein: 5.4 g/dL — ABNORMAL LOW (ref 6.1–8.1)

## 2019-07-14 LAB — CBC WITH DIFFERENTIAL/PLATELET
Absolute Monocytes: 625 cells/uL (ref 200–950)
Basophils Absolute: 50 cells/uL (ref 0–200)
Basophils Relative: 1 %
Eosinophils Absolute: 130 cells/uL (ref 15–500)
Eosinophils Relative: 2.6 %
HCT: 37.3 % (ref 35.0–45.0)
Hemoglobin: 12.3 g/dL (ref 11.7–15.5)
Lymphs Abs: 1195 cells/uL (ref 850–3900)
MCH: 30.3 pg (ref 27.0–33.0)
MCHC: 33 g/dL (ref 32.0–36.0)
MCV: 91.9 fL (ref 80.0–100.0)
MPV: 10.1 fL (ref 7.5–12.5)
Monocytes Relative: 12.5 %
Neutro Abs: 3000 cells/uL (ref 1500–7800)
Neutrophils Relative %: 60 %
Platelets: 270 10*3/uL (ref 140–400)
RBC: 4.06 10*6/uL (ref 3.80–5.10)
RDW: 11.8 % (ref 11.0–15.0)
Total Lymphocyte: 23.9 %
WBC: 5 10*3/uL (ref 3.8–10.8)

## 2019-07-14 NOTE — Progress Notes (Signed)
CBC is normal.  CMP is normal except low protein which is due to chronic disease.

## 2019-07-20 ENCOUNTER — Other Ambulatory Visit: Payer: Self-pay

## 2019-07-20 ENCOUNTER — Ambulatory Visit: Payer: Self-pay

## 2019-07-20 ENCOUNTER — Encounter: Payer: Self-pay | Admitting: Rheumatology

## 2019-07-20 ENCOUNTER — Ambulatory Visit (INDEPENDENT_AMBULATORY_CARE_PROVIDER_SITE_OTHER): Payer: Medicare PPO | Admitting: Rheumatology

## 2019-07-20 VITALS — BP 136/69 | HR 69 | Resp 13 | Ht 60.5 in | Wt 109.0 lb

## 2019-07-20 DIAGNOSIS — E559 Vitamin D deficiency, unspecified: Secondary | ICD-10-CM | POA: Diagnosis not present

## 2019-07-20 DIAGNOSIS — M19041 Primary osteoarthritis, right hand: Secondary | ICD-10-CM | POA: Diagnosis not present

## 2019-07-20 DIAGNOSIS — M25561 Pain in right knee: Secondary | ICD-10-CM

## 2019-07-20 DIAGNOSIS — E2839 Other primary ovarian failure: Secondary | ICD-10-CM

## 2019-07-20 DIAGNOSIS — M19042 Primary osteoarthritis, left hand: Secondary | ICD-10-CM

## 2019-07-20 DIAGNOSIS — Z8719 Personal history of other diseases of the digestive system: Secondary | ICD-10-CM

## 2019-07-20 DIAGNOSIS — M810 Age-related osteoporosis without current pathological fracture: Secondary | ICD-10-CM | POA: Diagnosis not present

## 2019-07-20 DIAGNOSIS — N952 Postmenopausal atrophic vaginitis: Secondary | ICD-10-CM

## 2019-07-20 DIAGNOSIS — M5030 Other cervical disc degeneration, unspecified cervical region: Secondary | ICD-10-CM

## 2019-07-20 DIAGNOSIS — G8929 Other chronic pain: Secondary | ICD-10-CM

## 2019-07-20 DIAGNOSIS — Z8709 Personal history of other diseases of the respiratory system: Secondary | ICD-10-CM

## 2019-07-20 DIAGNOSIS — E785 Hyperlipidemia, unspecified: Secondary | ICD-10-CM

## 2019-07-20 MED ORDER — TRIAMCINOLONE ACETONIDE 40 MG/ML IJ SUSP
40.0000 mg | INTRAMUSCULAR | Status: AC | PRN
  Administered 2019-07-20: 40 mg via INTRA_ARTICULAR

## 2019-07-20 MED ORDER — LIDOCAINE HCL 1 % IJ SOLN
1.5000 mL | INTRAMUSCULAR | Status: AC | PRN
  Administered 2019-07-20: 1.5 mL

## 2019-11-12 ENCOUNTER — Ambulatory Visit: Payer: Medicare Other | Admitting: Obstetrics and Gynecology

## 2019-12-03 ENCOUNTER — Encounter (HOSPITAL_COMMUNITY): Payer: Self-pay

## 2019-12-03 ENCOUNTER — Other Ambulatory Visit: Payer: Self-pay

## 2019-12-03 ENCOUNTER — Emergency Department (HOSPITAL_COMMUNITY): Payer: Medicare PPO

## 2019-12-03 ENCOUNTER — Inpatient Hospital Stay (HOSPITAL_COMMUNITY)
Admission: EM | Admit: 2019-12-03 | Discharge: 2019-12-05 | DRG: 641 | Disposition: A | Discharge status: Home / Self Care | Payer: Medicare PPO | Attending: Internal Medicine | Admitting: Internal Medicine

## 2019-12-03 DIAGNOSIS — R42 Dizziness and giddiness: Secondary | ICD-10-CM

## 2019-12-03 DIAGNOSIS — E871 Hypo-osmolality and hyponatremia: Principal | ICD-10-CM

## 2019-12-03 DIAGNOSIS — R21 Rash and other nonspecific skin eruption: Secondary | ICD-10-CM | POA: Diagnosis present

## 2019-12-03 DIAGNOSIS — E785 Hyperlipidemia, unspecified: Secondary | ICD-10-CM | POA: Diagnosis present

## 2019-12-03 DIAGNOSIS — R824 Acetonuria: Secondary | ICD-10-CM | POA: Diagnosis present

## 2019-12-03 DIAGNOSIS — E872 Acidosis, unspecified: Secondary | ICD-10-CM

## 2019-12-03 DIAGNOSIS — Z882 Allergy status to sulfonamides status: Secondary | ICD-10-CM

## 2019-12-03 DIAGNOSIS — R627 Adult failure to thrive: Secondary | ICD-10-CM | POA: Diagnosis present

## 2019-12-03 DIAGNOSIS — Z888 Allergy status to other drugs, medicaments and biological substances status: Secondary | ICD-10-CM

## 2019-12-03 DIAGNOSIS — Z8709 Personal history of other diseases of the respiratory system: Secondary | ICD-10-CM

## 2019-12-03 DIAGNOSIS — Z7951 Long term (current) use of inhaled steroids: Secondary | ICD-10-CM

## 2019-12-03 DIAGNOSIS — E869 Volume depletion, unspecified: Secondary | ICD-10-CM | POA: Diagnosis present

## 2019-12-03 DIAGNOSIS — Z20822 Contact with and (suspected) exposure to covid-19: Secondary | ICD-10-CM | POA: Diagnosis present

## 2019-12-03 DIAGNOSIS — H8309 Labyrinthitis, unspecified ear: Secondary | ICD-10-CM | POA: Diagnosis present

## 2019-12-03 DIAGNOSIS — Z79899 Other long term (current) drug therapy: Secondary | ICD-10-CM

## 2019-12-03 DIAGNOSIS — M810 Age-related osteoporosis without current pathological fracture: Secondary | ICD-10-CM | POA: Diagnosis present

## 2019-12-03 DIAGNOSIS — Z820 Family history of epilepsy and other diseases of the nervous system: Secondary | ICD-10-CM

## 2019-12-03 DIAGNOSIS — Z880 Allergy status to penicillin: Secondary | ICD-10-CM

## 2019-12-03 DIAGNOSIS — E860 Dehydration: Secondary | ICD-10-CM | POA: Diagnosis present

## 2019-12-03 DIAGNOSIS — E7800 Pure hypercholesterolemia, unspecified: Secondary | ICD-10-CM | POA: Diagnosis present

## 2019-12-03 DIAGNOSIS — Z681 Body mass index (BMI) 19 or less, adult: Secondary | ICD-10-CM

## 2019-12-03 DIAGNOSIS — T364X5A Adverse effect of tetracyclines, initial encounter: Secondary | ICD-10-CM | POA: Diagnosis present

## 2019-12-03 DIAGNOSIS — J45909 Unspecified asthma, uncomplicated: Secondary | ICD-10-CM | POA: Diagnosis present

## 2019-12-03 DIAGNOSIS — Z8042 Family history of malignant neoplasm of prostate: Secondary | ICD-10-CM

## 2019-12-03 DIAGNOSIS — Z881 Allergy status to other antibiotic agents status: Secondary | ICD-10-CM

## 2019-12-03 DIAGNOSIS — Z791 Long term (current) use of non-steroidal anti-inflammatories (NSAID): Secondary | ICD-10-CM

## 2019-12-03 LAB — COMPREHENSIVE METABOLIC PANEL
ALT: 20 U/L (ref 0–44)
AST: 38 U/L (ref 15–41)
Albumin: 4.2 g/dL (ref 3.5–5.0)
Alkaline Phosphatase: 61 U/L (ref 38–126)
Anion gap: 12 (ref 5–15)
BUN: 16 mg/dL (ref 8–23)
CO2: 20 mmol/L — ABNORMAL LOW (ref 22–32)
Calcium: 8.1 mg/dL — ABNORMAL LOW (ref 8.9–10.3)
Chloride: 94 mmol/L — ABNORMAL LOW (ref 98–111)
Creatinine, Ser: 0.66 mg/dL (ref 0.44–1.00)
GFR calc Af Amer: >60 mL/min (ref >60)
GFR calc non Af Amer: >60 mL/min (ref >60)
Glucose, Bld: 99 mg/dL (ref 70–99)
Potassium: 4.9 mmol/L (ref 3.5–5.1)
Sodium: 126 mmol/L — ABNORMAL LOW (ref 135–145)
Total Bilirubin: 1.8 mg/dL — ABNORMAL HIGH (ref 0.3–1.2)
Total Protein: 6.6 g/dL (ref 6.5–8.1)

## 2019-12-03 LAB — URINALYSIS, ROUTINE W REFLEX MICROSCOPIC
Bilirubin Urine: NEGATIVE
Glucose, UA: NEGATIVE mg/dL
Hgb urine dipstick: NEGATIVE
Ketones, ur: 80 mg/dL — AB
Leukocytes,Ua: NEGATIVE
Nitrite: NEGATIVE
Protein, ur: NEGATIVE mg/dL
Specific Gravity, Urine: 1.018 (ref 1.005–1.030)
pH: 6 (ref 5.0–8.0)

## 2019-12-03 LAB — NA AND K (SODIUM & POTASSIUM), RAND UR
Potassium Urine: 52 mmol/L
Sodium, Ur: 28 mmol/L

## 2019-12-03 LAB — CBC
HCT: 42.4 % (ref 36.0–46.0)
Hemoglobin: 14.7 g/dL (ref 12.0–15.0)
MCH: 31.5 pg (ref 26.0–34.0)
MCHC: 34.7 g/dL (ref 30.0–36.0)
MCV: 91 fL (ref 80.0–100.0)
Platelets: 212 10*3/uL (ref 150–400)
RBC: 4.66 MIL/uL (ref 3.87–5.11)
RDW: 13.2 % (ref 11.5–15.5)
WBC: 9.5 10*3/uL (ref 4.0–10.5)
nRBC: 0 % (ref 0.0–0.2)

## 2019-12-03 LAB — SARS CORONAVIRUS 2 BY RT PCR (HOSPITAL ORDER, PERFORMED IN ~~LOC~~ HOSPITAL LAB): SARS Coronavirus 2: NEGATIVE

## 2019-12-03 LAB — OSMOLALITY, URINE: Osmolality, Ur: 625 mOsm/kg (ref 300–900)

## 2019-12-03 MED ORDER — ACETAMINOPHEN 650 MG RE SUPP: 650.0000 mg | Freq: Four times a day (QID) | RECTAL | Status: DC | PRN

## 2019-12-03 MED ORDER — ENOXAPARIN SODIUM 40 MG/0.4ML ~~LOC~~ SOLN
40.0000 mg | SUBCUTANEOUS | Status: DC
  Administered 2019-12-03 – 2019-12-04 (×2): 40 mg via SUBCUTANEOUS
  Filled 2019-12-03 (×2): qty 0.4

## 2019-12-03 MED ORDER — ACETAMINOPHEN 325 MG PO TABS
650.0000 mg | ORAL_TABLET | Freq: Once | ORAL | Status: AC
  Administered 2019-12-03: 650 mg via ORAL
  Filled 2019-12-03: qty 2

## 2019-12-03 MED ORDER — SODIUM CHLORIDE 0.9% FLUSH
3.0000 mL | Freq: Once | INTRAVENOUS | Status: AC
  Administered 2019-12-03: 3 mL via INTRAVENOUS

## 2019-12-03 MED ORDER — SIMVASTATIN 20 MG PO TABS
20.0000 mg | ORAL_TABLET | Freq: Every day | ORAL | Status: DC
  Administered 2019-12-03 – 2019-12-05 (×3): 20 mg via ORAL
  Filled 2019-12-03 (×3): qty 1

## 2019-12-03 MED ORDER — PRAMIPEXOLE DIHYDROCHLORIDE 1 MG PO TABS
1.5000 mg | ORAL_TABLET | Freq: Every day | ORAL | Status: DC
  Administered 2019-12-03 – 2019-12-04 (×2): 1.5 mg via ORAL
  Filled 2019-12-03 (×3): qty 2

## 2019-12-03 MED ORDER — PANTOPRAZOLE SODIUM 40 MG PO TBEC
40.0000 mg | DELAYED_RELEASE_TABLET | Freq: Every day | ORAL | Status: DC
  Administered 2019-12-03 – 2019-12-05 (×3): 40 mg via ORAL
  Filled 2019-12-03 (×3): qty 1

## 2019-12-03 MED ORDER — FLUTICASONE FUROATE-VILANTEROL 200-25 MCG/INH IN AEPB
1.0000 | INHALATION_SPRAY | Freq: Every day | RESPIRATORY_TRACT | Status: DC
  Administered 2019-12-04 – 2019-12-05 (×2): 1 via RESPIRATORY_TRACT
  Filled 2019-12-03: qty 28

## 2019-12-03 MED ORDER — CALCIUM CARBONATE 1250 (500 CA) MG PO TABS
1250.0000 mg | ORAL_TABLET | Freq: Two times a day (BID) | ORAL | Status: DC
  Administered 2019-12-04 – 2019-12-05 (×4): 1250 mg via ORAL
  Filled 2019-12-03 (×4): qty 1

## 2019-12-03 MED ORDER — ACETAMINOPHEN 325 MG PO TABS
650.0000 mg | ORAL_TABLET | Freq: Four times a day (QID) | ORAL | Status: DC | PRN
  Administered 2019-12-03: 650 mg via ORAL
  Filled 2019-12-03: qty 2

## 2019-12-03 MED ORDER — MECLIZINE HCL 25 MG PO TABS
25.0000 mg | ORAL_TABLET | Freq: Once | ORAL | Status: AC
  Administered 2019-12-03: 25 mg via ORAL
  Filled 2019-12-03: qty 1

## 2019-12-03 MED ORDER — ONDANSETRON HCL 4 MG/2ML IJ SOLN
4.0000 mg | Freq: Once | INTRAMUSCULAR | Status: AC
  Administered 2019-12-03: 4 mg via INTRAVENOUS
  Filled 2019-12-03: qty 2

## 2019-12-03 MED ORDER — SODIUM CHLORIDE 0.9 % IV BOLUS (SEPSIS)
500.0000 mL | Freq: Once | INTRAVENOUS | Status: AC
  Administered 2019-12-03: 500 mL via INTRAVENOUS

## 2019-12-03 MED ORDER — VITAMIN D 25 MCG (1000 UNIT) PO TABS
1000.0000 [IU] | ORAL_TABLET | Freq: Every day | ORAL | Status: DC
  Administered 2019-12-03 – 2019-12-05 (×3): 1000 [IU] via ORAL
  Filled 2019-12-03 (×3): qty 1

## 2019-12-03 MED ORDER — SENNOSIDES-DOCUSATE SODIUM 8.6-50 MG PO TABS: 1.0000 | ORAL_TABLET | Freq: Every evening | ORAL | Status: DC | PRN

## 2019-12-03 MED ORDER — GABAPENTIN 300 MG PO CAPS
300.0000 mg | ORAL_CAPSULE | Freq: Every day | ORAL | Status: DC
  Administered 2019-12-03 – 2019-12-04 (×2): 300 mg via ORAL
  Filled 2019-12-03 (×2): qty 1

## 2019-12-03 MED ORDER — MORPHINE SULFATE (PF) 4 MG/ML IV SOLN
4.0000 mg | Freq: Once | INTRAVENOUS | Status: DC
  Filled 2019-12-03: qty 1

## 2019-12-03 MED ORDER — SODIUM CHLORIDE 0.9 % IV SOLN: INTRAVENOUS | Status: DC

## 2019-12-03 MED ORDER — AZELASTINE HCL 0.1 % NA SOLN
1.0000 | Freq: Two times a day (BID) | NASAL | Status: DC
  Administered 2019-12-03 – 2019-12-05 (×4): 1 via NASAL
  Filled 2019-12-03: qty 30

## 2019-12-03 MED ORDER — ONDANSETRON HCL 4 MG PO TABS: 4.0000 mg | ORAL_TABLET | Freq: Four times a day (QID) | ORAL | Status: DC | PRN

## 2019-12-03 MED ORDER — FLUTICASONE PROPIONATE 50 MCG/ACT NA SUSP
1.0000 | Freq: Every day | NASAL | Status: DC
  Administered 2019-12-05: 1 via NASAL
  Filled 2019-12-03: qty 16

## 2019-12-03 MED ORDER — PRAMIPEXOLE DIHYDROCHLORIDE 1 MG PO TABS
1.0000 mg | ORAL_TABLET | Freq: Every day | ORAL | Status: DC
  Filled 2019-12-03: qty 1

## 2019-12-03 MED ORDER — MECLIZINE HCL 25 MG PO TABS: 12.5000 mg | ORAL_TABLET | Freq: Two times a day (BID) | ORAL | Status: DC | PRN

## 2019-12-03 MED ORDER — ONDANSETRON HCL 4 MG/2ML IJ SOLN: 4.0000 mg | Freq: Four times a day (QID) | INTRAMUSCULAR | Status: DC | PRN

## 2019-12-03 MED ORDER — SODIUM CHLORIDE 0.9 % IV SOLN
1000.0000 mL | INTRAVENOUS | Status: DC
  Administered 2019-12-03 (×2): 1000 mL via INTRAVENOUS

## 2019-12-03 NOTE — Plan of Care (Signed)
POC initiated. Erle Crocker, RN

## 2019-12-03 NOTE — ED Notes (Signed)
Transport called.

## 2019-12-03 NOTE — Progress Notes (Signed)
Report received from Meagan, RN in ED. Erle Crocker, RN

## 2019-12-03 NOTE — H&P (Signed)
History and Physical    Megan Logan WUJ:[id number] DOB: 1940-08-02 DOA: 12/03/2019  PCP: Barbie Banner, MD   Patient coming from: Home, lives with husband  Chief Complaint  Patient presents with  . Emesis  . Headache      HPI: Megan Logan is a 79 y.o. female with medical history significant for anemia, asthma, history of colitis, osteoporosis, hyperlipidemia comes to the ER with multiple complaint of nausea vomiting headache dizziness. She started with headache 2 wks ago, started pseudophed, nose spray, had sinus infection a month ago took z pack for that. Last Sunday she started getting dizzy, had vomiting x5,6 and had bile coming out. Was seen by PCP on Monday- was given nausea meds and doxy- made it more sick and meds changed to Byaxin, but did not help. Got up this am feeling weak, dizzy and headache. She has not been eating well. Patient reports nausea, vomiting, chills but denies chest pain, shortness of breath, fever, focal weakness, numbness tingling, speech difficulties, vision difficulties. She reprots she had "bulld eyes" rash fom rinsect/tick bite- took doxycycline for 4 days and rash went away ( it was 3 wks ago).  ED Course: Vitals are stable, blood work showed hyponatremia at 126, bicarb 20 slightly abnormally total bilirubin 1.8, patient was still dizzy and symptomatic and MRI brain was obtained that did not show any acute stroke or any other acute finding.  Admission was requested for further management of her dehydration dizziness and hyponatremia.  She has had Covid vaccine and Covid swab is pending in the ED. She feels better overall now.  Review of Systems: All systems were reviewed and were negative except as mentioned in HPI above. Negative for fever Negative for chest pain Negative for shortness of breath  Past Medical History:  Diagnosis Date  . Anemia    diet related  . Asthma   . Atrophic vaginitis   . Colitis   . Hx of endometriosis    took  Danacrine  . Hypercholesterolemia   . Osteoporosis    spine off Actonel sinsce 09/2010- Drug [facility]  . Premature ovarian failure age 6   HRT age 67 - 12/2000    Past Surgical History:  Procedure Laterality Date  . HYSTEROSCOPY  1996   DUB--wnl  . NASAL SINUS SURGERY  40's and 50's  . TUBAL LIGATION Bilateral 1982     reports that she has never smoked. She has never used smokeless tobacco. She reports that she does not drink alcohol and does not use drugs.  Allergies  Allergen Reactions  . Cephalexin     Other reaction(s): Other (See Comments) un  . Erythromycin Base Other (See Comments)    un  . Hydrocodone-Homatropine Nausea Only  . Levofloxacin Other (See Comments)    insomnia  . Penicillins Swelling  . Prednisone     Heart palpitations, insomnia  . Sulfamethoxazole-Trimethoprim Other (See Comments)    un  . Augmentin [Amoxicillin-Pot Clavulanate] Nausea Only    Family History  Problem Relation Age of Onset  . Prostate cancer Father 24  . Alzheimer's disease Mother 51  . Other Sister        dizziness  . Cancer Sister 46       Brain and Lung  . Ulcerative colitis Sister   . Migraines Sister   . Cancer - Colon Maternal Grandfather 51  . Irritable bowel syndrome Daughter   . Colitis Daughter      Prior to Admission medications  Medication Sig Start Date End Date Taking? Authorizing Provider  Albuterol Sulfate (PROAIR RESPICLICK) 108 (90 Base) MCG/ACT AEPB as needed.     [provider]  BIOTIN 5000 PO Take by mouth.    [provider]  BREO ELLIPTA 200-25 MCG/INH AEPB Inhale 1 puff into the lungs daily. 07/02/16   [provider]  calcium carbonate (OS-CAL) 600 MG TABS Take 600 mg by mouth 2 (two) times daily with a meal.    [provider]  celecoxib (CELEBREX) 200 MG capsule TAKE ONE TABLET DAILY, WITH FOOD, FOR ARTHRITIS PAIN. 08/30/17   [provider]  cetirizine (ZYRTEC) 10 MG tablet Take 10 mg by mouth daily.     [provider]  cholecalciferol (VITAMIN D) 1000 units tablet Take 1 capsule by mouth daily.     [provider]  co-enzyme Q-10 30 MG capsule Take 30 mg by mouth 3 (three) times daily.    [provider]  denosumab (PROLIA) 60 MG/ML SOSY injection Inject 60 mg into the skin every 6 (six) months. 06/24/19   Pollyann Savoy, MD  fluticasone (FLONASE) 50 MCG/ACT nasal spray Place into both nostrils daily.    [provider]  gabapentin (NEURONTIN) 300 MG capsule gabapentin 300 mg capsule  TAKE 1 (ONE) CAPSULE AT BEDTIME    [provider]  Lutein 6 MG CAPS Take 1 capsule by mouth daily.    [provider]  LYSINE PO Take by mouth.    [provider]  montelukast (SINGULAIR) 10 MG tablet Take 10 mg by mouth at bedtime.    [provider]  Multiple Vitamin (MULTIVITAMIN) tablet Take 1 tablet by mouth daily.    [provider]  omeprazole (PRILOSEC) 10 MG capsule Take 10 mg by mouth daily.    [provider]  pramipexole (MIRAPEX) 1 MG tablet Take 1 tablet by mouth at bedtime. 05/31/14   [provider]  simvastatin (ZOCOR) 20 MG tablet TAKE 1 TABLET BY MOUTH EVERY DAY AT NIGHT 07/29/17   [provider]  Specialty Vitamins Products (MAGNESIUM, AMINO ACID CHELATE,) 133 MG tablet Take 1 tablet by mouth 2 (two) times daily.    [provider]    Physical Exam: Vitals:   12/03/19 1230 12/03/19 1300 12/03/19 1330 12/03/19 1400  BP: (!) 144/68 120/74 (!) 150/70 (!) 135/82  Pulse:   70 67  Resp:   16   Temp:      TempSrc:      SpO2:   100% 97%  Weight:      Height:        General exam: AAOx3,NAD, weak and frail appearing. HEENT:Oral mucosa moist, Ear/Nose WNL grossly, dentition normal. Respiratory system: bilaterally clear,no wheezing or crackles,no use of accessory muscle Cardiovascular system: S1 & S2 +, No JVD,. Gastrointestinal system: Abdomen soft, NT,ND, BS+ Nervous  System:Alert, awake, moving extremities and grossly nonfocal Extremities: No edema, distal peripheral pulses palpable.  Skin: No rashes,no icterus. MSK: Normal muscle bulk,tone, power   Labs on Admission: I have personally reviewed following labs and imaging studies  CBC: Recent Labs  Lab 12/03/19 1026  WBC 9.5  HGB 14.7  HCT 42.4  MCV 91.0  PLT 212   Basic Metabolic Panel: Recent Labs  Lab 12/03/19 1150  NA 126*  K 4.9  CL 94*  CO2 20*  GLUCOSE 99  BUN 16  CREATININE 0.66  CALCIUM 8.1*   GFR: Estimated Creatinine Clearance: 43 mL/min (by C-G formula based on  SCr of 0.66 mg/dL). Liver Function Tests: Recent Labs  Lab 12/03/19 1150  AST 38  ALT 20  ALKPHOS 61  BILITOT 1.8*  PROT 6.6  ALBUMIN 4.2   No results for input(s): LIPASE, AMYLASE in the last 168 hours. No results for input(s): AMMONIA in the last 168 hours. Coagulation Profile: No results for input(s): INR, PROTIME in the last 168 hours. Cardiac Enzymes: No results for input(s): CKTOTAL, CKMB, CKMBINDEX, TROPONINI in the last 168 hours. BNP (last 3 results) No results for input(s): PROBNP in the last 8760 hours. HbA1C: No results for input(s): HGBA1C in the last 72 hours. CBG: No results for input(s): GLUCAP in the last 168 hours. Lipid Profile: No results for input(s): CHOL, HDL, LDLCALC, TRIG, CHOLHDL, LDLDIRECT in the last 72 hours. Thyroid Function Tests: No results for input(s): TSH, T4TOTAL, FREET4, T3FREE, THYROIDAB in the last 72 hours. Anemia Panel: No results for input(s): VITAMINB12, FOLATE, FERRITIN, TIBC, IRON, RETICCTPCT in the last 72 hours. Urine analysis:    Component Value Date/Time   COLORURINE YELLOW 12/03/2019 1026   APPEARANCEUR CLEAR 12/03/2019 1026   LABSPEC 1.018 12/03/2019 1026   PHURINE 6.0 12/03/2019 1026   GLUCOSEU NEGATIVE 12/03/2019 1026   HGBUR NEGATIVE 12/03/2019 1026   BILIRUBINUR NEGATIVE 12/03/2019 1026   BILIRUBINUR n 08/09/2014 1545   KETONESUR 80 (A)  12/03/2019 1026   PROTEINUR NEGATIVE 12/03/2019 1026   UROBILINOGEN negative 08/09/2014 1545   NITRITE NEGATIVE 12/03/2019 1026   LEUKOCYTESUR NEGATIVE 12/03/2019 1026    Radiological Exams on Admission: MR BRAIN WO CONTRAST  Result Date: 12/03/2019 CLINICAL DATA:  Headache, nausea, vomiting EXAM: MRI HEAD WITHOUT CONTRAST TECHNIQUE: Multiplanar, multiecho pulse sequences of the brain and surrounding structures were obtained without intravenous contrast. COMPARISON:  None. FINDINGS: Brain: There is no acute infarction or intracranial hemorrhage. There is no intracranial mass, mass effect, or edema. There is no hydrocephalus or extra-axial fluid collection. Ventricles and sulci are within normal limits in size and configuration. Scattered foci of T2 hyperintensity in the supratentorial white matter are nonspecific but may reflect mild chronic microvascular ischemic changes. Vascular: Major vessel flow voids at the skull base are preserved. Skull and upper cervical spine: Degenerative changes at C1-C2. Normal marrow signal is preserved. Sinuses/Orbits: Minor mucosal thickening.  Orbits are unremarkable. Other: Sella is unremarkable.  Mastoid air cells are clear. IMPRESSION: No evidence of recent infarction, hemorrhage, or mass. Mild chronic microvascular ischemic changes. Electronically Signed   By: Guadlupe Spanish M.D.   On: 12/03/2019 11:36     Assessment/Plan  Dizziness likley multifactorial recent illness and antibiotic use, doxy use. Symptomatic management, mri is neg for acute stroke. Will add ivf, meclizine. Pt/ot. Check orthostatics, obtain PT/OT eval.  Hyponatremia- from poor intake, nausea vomiting, will treate with ns and repeat labs  Dehydration with ketonuria and Metabolic acidosis- cont ivf as above.  Dyslipidemia-cont zocor  History of asthma-controlled, cont breo.  RASH/?TICK Bite few wks ago- check lyme Borrelia B serology, she will need to fu with pcp- discussed w/  husband  Body mass index is 19.27 kg/m.   Severity of Illness: * I certify that at the point of admission it is my clinical judgment that the patient will require inpatient hospital care spanning less than 2 midnights from the point of admission due to high intensity of service, high risk for further deterioration and high frequency of surveillance required   DVT prophylaxis: enoxaparin (LOVENOX) injection 40 mg Start: 12/03/19 2200 Code Status:   Code Status:  Full Code  Family Communication: Admission, patients condition and plan of care including tests being ordered have been discussed with the patient and her husband who indicate understanding and agree with the plan and Code Status.  Consults called:  None  Lanae Boastamesh Jamas Jaquay MD [facility]  If 7PM-7AM, please contact night-coverage [email or web address]  12/03/2019, 3:18 PM

## 2019-12-03 NOTE — ED Triage Notes (Signed)
Patient c/o N/v and a headache x 5 days. Patient states she went to her physician 4 days ago and was prescribed doxycycline and zofran. Patient states she could not take the doxycycline and was started on Biaxin. Patient states her urine test and covid test were negative.

## 2019-12-03 NOTE — ED Provider Notes (Addendum)
[facility] Note   CSN: [id number] Arrival date & time: 12/03/19  0915     History Chief Complaint  Patient presents with  . Emesis  . Headache    Megan Logan is a 79 y.o. female.  HPI   Pt started to feel sick earlier this week.  It first started headache, nausea and dizziness.  She did not feel the room spinning but when she looked up or moved around she felt like the room was moving and she could not stand up right.  No trouble with blurred vision.  No trouble with strength or coordination.  She has had a mild cough. Some sinus congestion. Pt saw her doctor and was diagnosed with a sinus infection.  She was prescribed abx.  She also has tried sudafed.  Pt was changed to another abx on Tuesday but she is not feeling better.  No fevers.  She still has a headache.  Very poor appetite.  She feels weak all over.  She needed help today to even take a shower or get dressed.  Pt has been vaccinated for covid.  She was tested on Monday and it was negative. Past Medical History:  Diagnosis Date  . Anemia    diet related  . Asthma   . Atrophic vaginitis   . Colitis   . Hx of endometriosis    took Danacrine  . Hypercholesterolemia   . Osteoporosis    spine off Actonel sinsce 09/2010- [facility]  . Premature ovarian failure age 10   HRT age 11 - 12/2000    Patient Active Problem List   Diagnosis Date Noted  . Hyponatremia 12/03/2019  . Dizziness 12/03/2019  . Ketonuria 12/03/2019  . Metabolic acidosis 12/03/2019  . DDD (degenerative disc disease), cervical 12/15/2018  . Dyslipidemia 12/15/2018  . History of asthma 12/15/2018  . 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 Unspecified vitamin D deficiency 08/06/2013  . Age-related osteoporosis without current pathological fracture 08/04/2012  . 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    Past Surgical History:  Procedure Laterality Date  . HYSTEROSCOPY  1996   DUB--wnl  .  NASAL SINUS SURGERY  40's and 50's  . TUBAL LIGATION Bilateral 1982     OB History    Gravida  1   Para  1   Term  1   Preterm  0   AB  0   Living  1     SAB  0   TAB  0   Ectopic  0   Multiple  0   Live Births  1           Family History  Problem Relation Age of Onset  . Prostate cancer Father 18  . Alzheimer's disease Mother 82  . Other Sister        dizziness  . Cancer Sister 29       Brain and Lung  . Ulcerative colitis Sister   . Migraines Sister   . Cancer - Colon Maternal Grandfather 51  . Irritable bowel syndrome Daughter   . Colitis Daughter     Social History   Tobacco Use  . Smoking status: Never Smoker  . Smokeless tobacco: Never Used  Vaping Use  . Vaping Use: Never used  Substance Use Topics  . Alcohol use: No  . Drug use: No    Home Medications Prior to Admission medications   Medication Sig Start Date End Date Taking? Authorizing Provider  Albuterol  Sulfate (PROAIR RESPICLICK) 108 (90 Base) MCG/ACT AEPB as needed.     [provider]  BIOTIN 5000 PO Take by mouth.    [provider]  BREO ELLIPTA 200-25 MCG/INH AEPB Inhale 1 puff into the lungs daily. 07/02/16   [provider]  calcium carbonate (OS-CAL) 600 MG TABS Take 600 mg by mouth 2 (two) times daily with a meal.    [provider]  celecoxib (CELEBREX) 200 MG capsule TAKE ONE TABLET DAILY, WITH FOOD, FOR ARTHRITIS PAIN. 08/30/17   [provider]  cetirizine (ZYRTEC) 10 MG tablet Take 10 mg by mouth daily.    [provider]  cholecalciferol (VITAMIN D) 1000 units tablet Take 1 capsule by mouth daily.     [provider]  co-enzyme Q-10 30 MG capsule Take 30 mg by mouth 3 (three) times daily.    [provider]  denosumab (PROLIA) 60 MG/ML SOSY injection Inject 60 mg into the skin every 6 (six) months. 06/24/19   Pollyann Savoyeveshwar, Shaili, MD  fluticasone (FLONASE) 50 MCG/ACT nasal spray Place into both nostrils  daily.    [provider]  gabapentin (NEURONTIN) 300 MG capsule gabapentin 300 mg capsule  TAKE 1 (ONE) CAPSULE AT BEDTIME    [provider]  Lutein 6 MG CAPS Take 1 capsule by mouth daily.    [provider]  LYSINE PO Take by mouth.    [provider]  montelukast (SINGULAIR) 10 MG tablet Take 10 mg by mouth at bedtime.    [provider]  Multiple Vitamin (MULTIVITAMIN) tablet Take 1 tablet by mouth daily.    [provider]  omeprazole (PRILOSEC) 10 MG capsule Take 10 mg by mouth daily.    [provider]  pramipexole (MIRAPEX) 1 MG tablet Take 1 tablet by mouth at bedtime. 05/31/14   [provider]  simvastatin (ZOCOR) 20 MG tablet TAKE 1 TABLET BY MOUTH EVERY DAY AT NIGHT 07/29/17   [provider]  Specialty Vitamins Products (MAGNESIUM, AMINO ACID CHELATE,) 133 MG tablet Take 1 tablet by mouth 2 (two) times daily.    [provider]    Allergies    Cephalexin, Erythromycin base, Hydrocodone-homatropine, Levofloxacin, Penicillins, Prednisone, Sulfamethoxazole-trimethoprim, and Augmentin [amoxicillin-pot clavulanate]  Review of Systems   Review of Systems  All other systems reviewed and are negative.   Physical Exam Updated Vital Signs BP (!) 150/70   Pulse 70   Temp 97.8 F (36.6 C) (Oral)   Resp 16   Ht 1.549 m (5\' 1" )   Wt 46.3 kg   LMP 05/07/1982 (Approximate)   SpO2 100%   BMI 19.27 kg/m   Physical Exam Vitals and nursing note reviewed.  Constitutional:      General: She is not in acute distress.    Appearance: She is well-developed.  HENT:     Head: Normocephalic and atraumatic.     Right Ear: External ear normal.     Left Ear: External ear normal.  Eyes:     General: No scleral icterus.       Right eye: No discharge.        Left eye: No discharge.     Conjunctiva/sclera: Conjunctivae normal.  Neck:     Trachea: No tracheal deviation.  Cardiovascular:     Rate and  Rhythm: Normal rate and regular rhythm.  Pulmonary:     Effort: Pulmonary effort is normal. No respiratory distress.     Breath sounds: Normal breath sounds.  No stridor. No wheezing or rales.  Abdominal:     General: Bowel sounds are normal. There is no distension.     Palpations: Abdomen is soft.     Tenderness: There is no abdominal tenderness. There is no guarding or rebound.  Musculoskeletal:        General: No tenderness.     Cervical back: Neck supple.  Skin:    General: Skin is warm and dry.     Findings: No rash.  Neurological:     Mental Status: She is alert.     Cranial Nerves: No cranial nerve deficit (no facial droop, extraocular movements intact, no slurred speech), dysarthria or facial asymmetry.     Sensory: No sensory deficit.     Motor: Weakness present. No abnormal muscle tone or seizure activity.     Coordination: Coordination normal.     Comments: Generalized, unable to walk right now; normal finger to nose exam, no facial droop, normal speech      ED Results / Procedures / Treatments   Labs (all labs ordered are listed, but only abnormal results are displayed) Labs Reviewed  URINALYSIS, ROUTINE W REFLEX MICROSCOPIC - Abnormal; Notable for the following components:      Result Value   Ketones, ur 80 (*)    All other components within normal limits  COMPREHENSIVE METABOLIC PANEL - Abnormal; Notable for the following components:   Sodium 126 (*)    Chloride 94 (*)    CO2 20 (*)    Calcium 8.1 (*)    Total Bilirubin 1.8 (*)    All other components within normal limits  SARS CORONAVIRUS 2 BY RT PCR [facility] ORDER, PERFORMED IN [facility])  CBC    EKG EKG Interpretation  Date/Time:  Thursday December 03 2019 09:44:34 EDT Ventricular Rate:  90 PR Interval:    QRS Duration: 134 QT Interval:  383 QTC Calculation: 469 R Axis:   95 Text Interpretation: Sinus rhythm RBBB and LPFB No old tracing to compare Confirmed by Linwood Dibbles [phone]) on  12/03/2019 1:48:53 PM   Radiology MR BRAIN WO CONTRAST  Result Date: 12/03/2019 CLINICAL DATA:  Headache, nausea, vomiting EXAM: MRI HEAD WITHOUT CONTRAST TECHNIQUE: Multiplanar, multiecho pulse sequences of the brain and surrounding structures were obtained without intravenous contrast. COMPARISON:  None. FINDINGS: Brain: There is no acute infarction or intracranial hemorrhage. There is no intracranial mass, mass effect, or edema. There is no hydrocephalus or extra-axial fluid collection. Ventricles and sulci are within normal limits in size and configuration. Scattered foci of T2 hyperintensity in the supratentorial white matter are nonspecific but may reflect mild chronic microvascular ischemic changes. Vascular: Major vessel flow voids at the skull base are preserved. Skull and upper cervical spine: Degenerative changes at C1-C2. Normal marrow signal is preserved. Sinuses/Orbits: Minor mucosal thickening.  Orbits are unremarkable. Other: Sella is unremarkable.  Mastoid air cells are clear. IMPRESSION: No evidence of recent infarction, hemorrhage, or mass. Mild chronic microvascular ischemic changes. Electronically Signed   By: Guadlupe Spanish M.D.   On: 12/03/2019 11:36    Procedures Procedures (including critical care time)  Medications Ordered in ED Medications  sodium chloride 0.9 % bolus 500 mL (0 mLs Intravenous Stopped 12/03/19 1148)    Followed by  0.9 %  sodium chloride infusion (1,000 mLs Intravenous New Bag/Given 12/03/19 1152)  morphine 4 MG/ML injection 4 mg (has no administration in time range)  sodium chloride flush (NS) 0.9 % injection 3 mL (3 mLs  Intravenous Given 12/03/19 1032)  meclizine (ANTIVERT) tablet 25 mg (25 mg Oral Given 12/03/19 1029)  ondansetron (ZOFRAN) injection 4 mg (4 mg Intravenous Given 12/03/19 1028)  acetaminophen (TYLENOL) tablet 650 mg (650 mg Oral Given 12/03/19 1029)    ED Course  I have reviewed the triage vital signs and the nursing notes.  Pertinent  labs & imaging results that were available during my care of the patient were reviewed by me and considered in my medical decision making (see chart for details).  Clinical Course as of Dec 03 1347  Thu Dec 03, 2019  1317 Patient's labs are notable for urinalysis showing ketones.  Metabolic panel does show hyponatremia.  This is decreased from prior labs with a sodium level was 135.   [JK]    Clinical Course User Index [JK] Linwood Dibbles, MD   MDM Rules/Calculators/A&P                          Patient presented to the ED for evaluation of dizziness weakness.  Patient has had nausea and vomiting.  Patient presented with dizziness concerning for the possibility of occult stroke.  MRI was performed and this fortunately does not show any signs of acute abnormality.  Patient may have a component of peripheral vertigo but it is also possible her dizziness is associated with her hyponatremia.  No seizures or altered mental status at this point.  Patient is still feeling symptomatic.  I will consult with the medical service for admission for further treatment of her hyponatremia and nausea. Final Clinical Impression(s) / ED Diagnoses Final diagnoses:  Hyponatremia  Dizziness      Linwood Dibbles, MD 12/03/19 1331    Linwood Dibbles, MD 12/03/19 1349

## 2019-12-03 NOTE — ED Notes (Signed)
Patient transported to MRI 

## 2019-12-03 NOTE — Progress Notes (Signed)
RN admit note: pt received from ED, via stretcher, husband at bedside. Oriented to Unit and room. Educated on use of call bell and television report. Reports N/V, dizziness, and headache. Advised of falls precautions, pt instructed on use of bedside commode/assistance needed. Will medicate with tylenol for headache. Initial assessment completed, see flowsheet for assessment. IVF initiated per MD order. Admission H/P started. No distress noted at this time. Will continue to monitor pt. Closely. Erle Crocker RN

## 2019-12-04 DIAGNOSIS — R627 Adult failure to thrive: Secondary | ICD-10-CM | POA: Diagnosis present

## 2019-12-04 DIAGNOSIS — Z880 Allergy status to penicillin: Secondary | ICD-10-CM | POA: Diagnosis not present

## 2019-12-04 DIAGNOSIS — Z791 Long term (current) use of non-steroidal anti-inflammatories (NSAID): Secondary | ICD-10-CM | POA: Diagnosis not present

## 2019-12-04 DIAGNOSIS — R824 Acetonuria: Secondary | ICD-10-CM

## 2019-12-04 DIAGNOSIS — Z681 Body mass index (BMI) 19 or less, adult: Secondary | ICD-10-CM | POA: Diagnosis not present

## 2019-12-04 DIAGNOSIS — R21 Rash and other nonspecific skin eruption: Secondary | ICD-10-CM | POA: Diagnosis present

## 2019-12-04 DIAGNOSIS — Z8709 Personal history of other diseases of the respiratory system: Secondary | ICD-10-CM

## 2019-12-04 DIAGNOSIS — M810 Age-related osteoporosis without current pathological fracture: Secondary | ICD-10-CM | POA: Diagnosis present

## 2019-12-04 DIAGNOSIS — E785 Hyperlipidemia, unspecified: Secondary | ICD-10-CM

## 2019-12-04 DIAGNOSIS — E7800 Pure hypercholesterolemia, unspecified: Secondary | ICD-10-CM | POA: Diagnosis present

## 2019-12-04 DIAGNOSIS — E869 Volume depletion, unspecified: Secondary | ICD-10-CM | POA: Diagnosis present

## 2019-12-04 DIAGNOSIS — Z7951 Long term (current) use of inhaled steroids: Secondary | ICD-10-CM | POA: Diagnosis not present

## 2019-12-04 DIAGNOSIS — H8309 Labyrinthitis, unspecified ear: Secondary | ICD-10-CM | POA: Diagnosis present

## 2019-12-04 DIAGNOSIS — Z888 Allergy status to other drugs, medicaments and biological substances status: Secondary | ICD-10-CM | POA: Diagnosis not present

## 2019-12-04 DIAGNOSIS — E860 Dehydration: Secondary | ICD-10-CM | POA: Diagnosis present

## 2019-12-04 DIAGNOSIS — T364X5A Adverse effect of tetracyclines, initial encounter: Secondary | ICD-10-CM | POA: Diagnosis present

## 2019-12-04 DIAGNOSIS — R42 Dizziness and giddiness: Secondary | ICD-10-CM

## 2019-12-04 DIAGNOSIS — Z20822 Contact with and (suspected) exposure to covid-19: Secondary | ICD-10-CM | POA: Diagnosis present

## 2019-12-04 DIAGNOSIS — Z79899 Other long term (current) drug therapy: Secondary | ICD-10-CM | POA: Diagnosis not present

## 2019-12-04 DIAGNOSIS — Z820 Family history of epilepsy and other diseases of the nervous system: Secondary | ICD-10-CM | POA: Diagnosis not present

## 2019-12-04 DIAGNOSIS — H8303 Labyrinthitis, bilateral: Secondary | ICD-10-CM | POA: Diagnosis not present

## 2019-12-04 DIAGNOSIS — J45909 Unspecified asthma, uncomplicated: Secondary | ICD-10-CM | POA: Diagnosis present

## 2019-12-04 DIAGNOSIS — Z8042 Family history of malignant neoplasm of prostate: Secondary | ICD-10-CM | POA: Diagnosis not present

## 2019-12-04 DIAGNOSIS — K529 Noninfective gastroenteritis and colitis, unspecified: Secondary | ICD-10-CM

## 2019-12-04 DIAGNOSIS — E872 Acidosis: Secondary | ICD-10-CM | POA: Diagnosis present

## 2019-12-04 DIAGNOSIS — Z882 Allergy status to sulfonamides status: Secondary | ICD-10-CM | POA: Diagnosis not present

## 2019-12-04 DIAGNOSIS — E871 Hypo-osmolality and hyponatremia: Secondary | ICD-10-CM | POA: Diagnosis present

## 2019-12-04 DIAGNOSIS — Z881 Allergy status to other antibiotic agents status: Secondary | ICD-10-CM | POA: Diagnosis not present

## 2019-12-04 LAB — CBC
HCT: 36.5 % (ref 36.0–46.0)
Hemoglobin: 12.3 g/dL (ref 12.0–15.0)
MCH: 31.1 pg (ref 26.0–34.0)
MCHC: 33.7 g/dL (ref 30.0–36.0)
MCV: 92.4 fL (ref 80.0–100.0)
Platelets: 239 10*3/uL (ref 150–400)
RBC: 3.95 MIL/uL (ref 3.87–5.11)
RDW: 13.2 % (ref 11.5–15.5)
WBC: 5.2 10*3/uL (ref 4.0–10.5)
nRBC: 0 % (ref 0.0–0.2)

## 2019-12-04 LAB — COMPREHENSIVE METABOLIC PANEL
ALT: 17 U/L (ref 0–44)
AST: 18 U/L (ref 15–41)
Albumin: 3.3 g/dL — ABNORMAL LOW (ref 3.5–5.0)
Alkaline Phosphatase: 49 U/L (ref 38–126)
Anion gap: 6 (ref 5–15)
BUN: 10 mg/dL (ref 8–23)
CO2: 21 mmol/L — ABNORMAL LOW (ref 22–32)
Calcium: 7.6 mg/dL — ABNORMAL LOW (ref 8.9–10.3)
Chloride: 103 mmol/L (ref 98–111)
Creatinine, Ser: 0.55 mg/dL (ref 0.44–1.00)
GFR calc Af Amer: >60 mL/min (ref >60)
GFR calc non Af Amer: >60 mL/min (ref >60)
Glucose, Bld: 81 mg/dL (ref 70–99)
Potassium: 3.5 mmol/L (ref 3.5–5.1)
Sodium: 130 mmol/L — ABNORMAL LOW (ref 135–145)
Total Bilirubin: 1.3 mg/dL — ABNORMAL HIGH (ref 0.3–1.2)
Total Protein: 5.2 g/dL — ABNORMAL LOW (ref 6.5–8.1)

## 2019-12-04 LAB — B. BURGDORFI ANTIBODIES: B burgdorferi Ab IgG+IgM: <0.91 {ISR} (ref 0.00–0.90)

## 2019-12-04 MED ORDER — SALINE SPRAY 0.65 % NA SOLN
2.0000 | NASAL | Status: DC
  Administered 2019-12-04 – 2019-12-05 (×6): 2 via NASAL
  Filled 2019-12-04: qty 44

## 2019-12-04 NOTE — Evaluation (Signed)
Physical Therapy Evaluation Patient Details Name: Megan Logan MRN: [id number] DOB: Nov 25, 1940 Today's Date: 12/04/2019   History of Present Illness  79 y.o. female with medical history significant for anemia, asthma, history of colitis, osteoporosis, hyperlipidemia comes to the ER with multiple complaint of nausea vomiting headache dizziness. Patient diagnosed with Hyponatremia, Dehydration with ketonuria and Metabolic acidosi.    Clinical Impression  Pt admitted as above and presenting with functional mobility limitations 2* fatigue and mild ambulatory balance deficits.  Pt with c/o dizziness with position change, turning with ambulation or turning head.  Will request vestibular screen/eval to further assess.      Follow Up Recommendations No PT follow up    Equipment Recommendations  None recommended by PT    Recommendations for Other Services       Precautions / Restrictions Precautions Precautions: Fall Restrictions Weight Bearing Restrictions: No      Mobility  Bed Mobility Overal bed mobility: Modified Independent                Transfers Overall transfer level: Needs assistance Equipment used: None Transfers: Sit to/from Stand Sit to Stand: Min guard;Supervision         General transfer comment: pt steadying self on arm rest after standing with c/o mild dizziness  Ambulation/Gait Ambulation/Gait assistance: Min guard;Supervision Gait Distance (Feet): 300 Feet Assistive device: None;Straight cane Gait Pattern/deviations: Step-through pattern;Decreased step length - right;Decreased step length - left;Shuffle;Wide base of support Gait velocity: decr   General Gait Details: General instability improving with increased distance walked and with noted slightly widened BOS - no overt LOB but pt moving cautiously and pausing to steady with c/o dizziness most notably on turns.  Pt ambulated 100' with [facility] stating she felt more stable with cane  Stairs             Wheelchair Mobility    Modified Rankin (Stroke Patients Only)       Balance Overall balance assessment: Mild deficits observed, not formally tested                                           Pertinent Vitals/Pain Pain Assessment: Faces Faces Pain Scale: Hurts a little bit Pain Location: headache Pain Descriptors / Indicators: Headache Pain Intervention(s): Limited activity within patient's tolerance;Monitored during session    Home Living Family/patient expects to be discharged to:: Private residence Living Arrangements: Spouse/significant other Available Help at Discharge: Family Type of Home: House Home Access: Ramped entrance     Home Layout: Two level;Able to live on main level with bedroom/bathroom Home Equipment: Grab bars - tub/shower;Cane - single point      Prior Function Level of Independence: Independent         Comments: likes to be outside, garden, active at baseline     Hand Dominance   Dominant Hand: Left    Extremity/Trunk Assessment   Upper Extremity Assessment Upper Extremity Assessment: Overall WFL for tasks assessed    Lower Extremity Assessment Lower Extremity Assessment: Overall WFL for tasks assessed    Cervical / Trunk Assessment Cervical / Trunk Assessment: Normal  Communication   Communication: No difficulties  Cognition Arousal/Alertness: Awake/alert Behavior During Therapy: WFL for tasks assessed/performed Overall Cognitive Status: Within Functional Limits for tasks assessed  General Comments General comments (skin integrity, edema, etc.): orthotic BPs done this am by OT without issue    Exercises     Assessment/Plan    PT Assessment Patient needs continued PT services  PT Problem List Decreased activity tolerance;Decreased balance;Decreased mobility;Decreased knowledge of use of DME       PT Treatment Interventions DME  instruction;Gait training;Stair training;Functional mobility training;Therapeutic activities;Therapeutic exercise;Patient/family education;Balance training    PT Goals (Current goals can be found in the Care Plan section)  Acute Rehab PT Goals Patient Stated Goal: go home today PT Goal Formulation: With patient Time For Goal Achievement: 12/18/19 Potential to Achieve Goals: Good    Frequency Min 3X/week   Barriers to discharge        Co-evaluation               AM-PAC PT "6 Clicks" Mobility  Outcome Measure Help needed turning from your back to your side while in a flat bed without using bedrails?: None Help needed moving from lying on your back to sitting on the side of a flat bed without using bedrails?: None Help needed moving to and from a bed to a chair (including a wheelchair)?: A Little Help needed standing up from a chair using your arms (e.g., wheelchair or bedside chair)?: A Little Help needed to walk in hospital room?: A Little Help needed climbing 3-5 steps with a railing? : A Little 6 Click Score: 20    End of Session Equipment Utilized During Treatment: Gait belt Activity Tolerance: Patient tolerated treatment well Patient left: in bed;with call bell/phone within reach;with bed alarm set Nurse Communication: Mobility status PT Visit Diagnosis: Difficulty in walking, not elsewhere classified (R26.2);Dizziness and giddiness (R42)    Time: 0938-1000 PT Time Calculation (min) (ACUTE ONLY): 22 min   Charges:   PT Evaluation $PT Eval Low Complexity: 1 Low          Mauro Kaufmann PT Acute Rehabilitation Services Pager [phone] Office [phone]   [name] 12/04/2019, 12:19 PM

## 2019-12-04 NOTE — Care Management Obs Status (Signed)
MEDICARE OBSERVATION STATUS NOTIFICATION   Patient Details  Name: Megan Logan MRN: [id number] Date of Birth: 01-Jul-1940   Medicare Observation Status Notification Given:  Yes    Golda Acre, RN 12/04/2019, 8:29 AM

## 2019-12-04 NOTE — Progress Notes (Signed)
PROGRESS NOTE  Megan Logan NUU:[id number] DOB: 24-Nov-1940 DOA: 12/03/2019 PCP: Barbie Banner, MD  Brief History    Megan Logan is a 79 y.o. female with medical history significant for anemia, asthma, history of colitis, osteoporosis, hyperlipidemia comes to the ER with multiple complaint of nausea vomiting headache dizziness. She started with headache 2 wks ago, started pseudophed, nose spray, had sinus infection a month ago took z pack for that. Last Sunday she started getting dizzy, had vomiting x5,6 and had bile coming out. Was seen by PCP on Monday- was given nausea meds and doxy- made it more sick and meds changed to Byaxin, but did not help. Got up this am feeling weak, dizzy and headache. She has not been eating well. Patient reports nausea, vomiting, chills but denies chest pain, shortness of breath, fever, focal weakness, numbness tingling, speech difficulties, vision difficulties. She reprots she had "bulld eyes" rash fom rinsect/tick bite- took doxycycline for 4 days and rash went away ( it was 3 wks ago).  ED Course: Vitals are stable, blood work showed hyponatremia at 126, bicarb 20 slightly abnormally total bilirubin 1.8, patient was still dizzy and symptomatic and MRI brain was obtained that did not show any acute stroke or any other acute finding.  Admission was requested for further management of her dehydration dizziness and hyponatremia.  She has had the Covid vaccine. Covid test was negative in the ED.  [facility] were consulted to admit the patient for further evaluation and car.   She is admitted to a med/surg bed. She is receiving IV fluids and meclizine. She has been evaluated by PT/OT. They have recommended discharge to home with no PT follow up. She is being tested for Borrelia Bughdorferi via PCR.   Consultants  . None  Procedures  . None  Antibiotics   Anti-infectives (From admission, onward)   None    .  Subjective  The patient is resting  comfortably. No new complaints.  Objective   Vitals:  Vitals:   12/04/19 0536 12/04/19 0924  BP: 126/69   Pulse: 64   Resp: 17   Temp: (!) 97.5 F (36.4 C)   SpO2: 100% 98%   Exam:  Constitutional:  . The patient is awake, alert, and oriented x 3. No acute distress. Respiratory:  . No increased work of breathing. . No wheezes, rales, or rhonchi . No tactile fremitus Cardiovascular:  . Regular rate and rhythm . No murmurs, ectopy, or gallups. . No lateral PMI. No thrills. Abdomen:  . Abdomen is soft, non-tender, non-distended . No hernias, masses, or organomegaly . Normoactive bowel sounds.  Musculoskeletal:  . No cyanosis, clubbing, or edema Skin:  . No rashes, lesions, ulcers . palpation of skin: no induration or nodules Neurologic:  . CN 2-12 intact . Sensation all 4 extremities intact Psychiatric:  . Mental status o Mood, affect appropriate o Orientation to person, place, time  . judgment and insight appear intact I have personally reviewed the following:   Today's Data  . Vitals, CMP, CBC, Glucoses.  Imaging  . MRI brain  Cardiology Data  . EKG  Scheduled Meds: . azelastine  1 spray Each Nare BID  . calcium carbonate  1,250 mg Oral BID WC  . cholecalciferol  1,000 Units Oral Daily  . enoxaparin (LOVENOX) injection  40 mg Subcutaneous Q24H  . fluticasone  1 spray Each Nare Daily  . fluticasone furoate-vilanterol  1 puff Inhalation Daily  . gabapentin  300 mg Oral  QHS  .  morphine injection  4 mg Intravenous Once  . pantoprazole  40 mg Oral Daily  . pramipexole  1.5 mg Oral QHS  . simvastatin  20 mg Oral q1800  . sodium chloride  2 spray Each Nare Q4H   Continuous Infusions: . sodium chloride Stopped (12/03/19 1155)  . sodium chloride 100 mL/hr at 12/04/19 1212    Active Problems:   Dyslipidemia   History of asthma   Hyponatremia   Dizziness   Ketonuria   Metabolic acidosis   LOS: 0 days   A & P  Dizziness likley multifactorial  recent illness and antibiotic use, doxy use. Symptomatic management, mri is neg for acute stroke. Orthostatics are negative. The patient is receiving IV fluid. Will start nasal saline. Stop antibiotics. Continue meclizine. PT/OT have evaluated the patient. They have recommended discharge to home with no PT follow up.   Hyponatremia: Improved from 126 on admission to 130 today. Likely due to volume depletion due to -poor intake and nausea/vomiting/diarrhea. Monitor chemistry and electrolytes.  Dehydration with ketonuria and metabolic acidosis: Resolving. Monitor.  Dyslipidemia: Cont zocor as at home.  History of asthma: Noted. Asymptomatic.Continue breo.  Tick bite with target like rash about 3 weeks ago. The patient discussed this with her PCP. She was placed on Doxycycline for four days after which she was counseled to stop it. The patient states that she found the tick relatively quickly after being outside, and that the tick was very small when she found it. This is consistent with a very short time of exposure and a relatively low risk of Lyme disease innoculation. B. Burghdorferi has been sent to out side lab. Will stop IV antibiotics.   Body mass index is 19.27 kg/m.   I have seen and examined this patient myself. I have spent 34 minutes in her evaluation.  Severity of Illness: * I certify that at the point of admission it is my clinical judgment that the patient will require inpatient hospital care spanning less than 2 midnights from the point of admission due to high intensity of service, high risk for further deterioration and high frequency of surveillance required   DVT prophylaxis: enoxaparin (LOVENOX) injection 40 mg Start: 12/03/19 2200 Code Status:   Code Status: Full Code  Family Communication: Admission, patients condition and plan of care including tests being ordered have been discussed with the patient and her husband who indicate understanding and agree with the plan  and Code Status.  Status is: Inpatient  Remains inpatient appropriate because:IV treatments appropriate due to intensity of illness or inability to take PO   Dispo: The patient is from: Home              Anticipated d/c is to: Home              Anticipated d/c date is: 2 days              Patient currently is not medically stable to d/c.   Mardene Lessig, DO [facility] Direct contact: see [email or web address]  7PM-7AM contact night coverage as above 12/04/2019, 4:02 PM  LOS: 0 days

## 2019-12-04 NOTE — Evaluation (Signed)
Occupational Therapy Evaluation Patient Details Name: Megan Logan MRN: [id number] DOB: 04-May-1941 Today's Date: 12/04/2019    History of Present Illness 79 y.o. female with medical history significant for anemia, asthma, history of colitis, osteoporosis, hyperlipidemia comes to the ER with multiple complaint of nausea vomiting headache dizziness. Patient diagnosed with Hyponatremia, Dehydration with ketonuria and Metabolic acidosi.     Clinical Impression   Patient reports feeling weaker than her baseline, however improved since receiving IV fluids. Patient did not require physical assistance for self care tasks, just increased time. Educate patient on gradually increasing activity as she recovers, patient verbalize understanding. No further acute OT needs at this time, will sign off.   Orthostatic vitals listed below, patient reported initial dizziness with standing however did not get worse and felt better once seated in recliner.    12/04/19 0800  Vital Signs  Patient Position (if appropriate) Orthostatic Vitals  Orthostatic Lying   BP- Lying 129/70  Orthostatic Sitting  BP- Sitting 130/74  Orthostatic Standing at 3 minutes  BP- Standing at 3 minutes 130/72 (pt report initial light headed with standing)      Follow Up Recommendations  No OT follow up    Equipment Recommendations  None recommended by OT       Precautions / Restrictions Precautions Precautions: Fall Restrictions Weight Bearing Restrictions: No      Mobility Bed Mobility Overal bed mobility: Modified Independent                Transfers Overall transfer level: Modified independent Equipment used: None             General transfer comment: patient transfer to recliner with increased time no physical assistance required, reports feeling a little weak but better than yesterday    Balance Overall balance assessment: Mild deficits observed, not formally tested                                          ADL either performed or assessed with clinical judgement   ADL Overall ADL's : At baseline                                       General ADL Comments: patient requiring increased time due to feeling weak however does not require physical assistance to perform self care tasks      Vision Baseline Vision/History: Wears glasses Wears Glasses: At all times       \       Pertinent Vitals/Pain Pain Assessment: Faces Faces Pain Scale: Hurts a little bit Pain Location: headache Pain Descriptors / Indicators: Headache Pain Intervention(s): Monitored during session     Hand Dominance Left   Extremity/Trunk Assessment Upper Extremity Assessment Upper Extremity Assessment: Overall WFL for tasks assessed   Lower Extremity Assessment Lower Extremity Assessment: Defer to PT evaluation   Cervical / Trunk Assessment Cervical / Trunk Assessment: Normal   Communication Communication Communication: No difficulties   Cognition Arousal/Alertness: Awake/alert Behavior During Therapy: WFL for tasks assessed/performed Overall Cognitive Status: Within Functional Limits for tasks assessed                                     General Comments  negative orthostaticsc  Home Living Family/patient expects to be discharged to:: Private residence Living Arrangements: Spouse/significant other Available Help at Discharge: Family Type of Home: House Home Access: Ramped entrance     Home Layout: Two level;Able to live on main level with bedroom/bathroom     Bathroom Shower/Tub: Producer, television/film/video: Handicapped height     Home Equipment: Grab bars - tub/shower          Prior Functioning/Environment Level of Independence: Independent        Comments: likes to be outside, garden, active at baseline        OT Problem List: Decreased activity tolerance;Pain         OT Goals(Current goals  can be found in the care plan section) Acute Rehab OT Goals Patient Stated Goal: go home today OT Goal Formulation: With patient   AM-PAC OT "6 Clicks" Daily Activity     Outcome Measure Help from another person eating meals?: None Help from another person taking care of personal grooming?: None Help from another person toileting, which includes using toliet, bedpan, or urinal?: None Help from another person bathing (including washing, rinsing, drying)?: None Help from another person to put on and taking off regular upper body clothing?: None Help from another person to put on and taking off regular lower body clothing?: None 6 Click Score: 24   End of Session Nurse Communication: Mobility status  Activity Tolerance: Patient tolerated treatment well Patient left: in chair;with call bell/phone within reach;with chair alarm set  OT Visit Diagnosis: Pain Pain - part of body:  (head)                Time: 8366-2947 OT Time Calculation (min): 37 min Charges:  OT General Charges $OT Visit: 1 Visit OT Evaluation $OT Eval Moderate Complexity: 1 Mod  Marlyce Huge OT Pager: [phone]  Carmelia Roller 12/04/2019, 10:06 AM

## 2019-12-04 NOTE — Progress Notes (Signed)
Physical Therapy Treatment Patient Details Name: Megan Logan MRN: [id number] DOB: 1940-06-21 Today's Date: 12/04/2019    History of Present Illness 79 y.o. female with medical history significant for anemia, asthma, history of colitis, osteoporosis, hyperlipidemia comes to the ER with multiple complaint of nausea vomiting headache dizziness. Patient diagnosed with Hyponatremia, Dehydration with ketonuria and Metabolic acidosi.      PT Comments     Patient seen for follow-up session this date for vestibular and balance specific testing. Patient reports some "dizziness, swimmy headed feeling" and reports some pressure/fullness in Rt ear. Patient did not have any spontaneous nystagmus and no gaze induced nystagmus. Head thrust bil or head shaking test appeared negative however testing was limited by guarding and limited cervical ROM. Pt did have overshooting in bil eyes with downward movements during smooth pursuits. Pt's symptoms were provoked by head turns Rt>Lt and head nods Up>Down. Symptoms were increased in dynamic environment of standing and during gait. Based on current testing and pt subjective symptoms with fullness in ear and possible Rt hearing changes (she reports she hears better out of Lt ear on the phone than Rt). Differentials include and are not limited cervicogenic vertigo, motion sensitivity, meniere's, labyrinthitis, and vestibular hypofunction. Ms. Stryker will benefit from follow up with her ENT and follow up for vestibular rehab for further testing. Acute PT will continue to follow for mobility and vestibular evaluation and treatment.   Follow Up Recommendations  Outpatient PT;Other (comment) (ENT )     Equipment Recommendations  None recommended by PT    Recommendations for Other Services       Precautions / Restrictions Precautions Precautions: Fall Restrictions Weight Bearing Restrictions: No      Vestibular Assessment - 12/04/19 0001      Symptom Behavior    Subjective history of current problem Pt reports dizziness starts with quick movements. "I turn to quick". "It's more when I'm walking.Marland KitchenMarland KitchenI'm walking a lot". "I'm always running into furniture". "It's like I might stumble and I never fall". "I feel "swimmy headed"".     Type of Dizziness  Lightheadedness;Spinning;Imbalance;Comment   swimmy headed; pressure in Rt ear   Frequency of Dizziness just when moving quickly, more in the last few months than usual.    Duration of Dizziness once steady myself its gone. usually stop and hold something near by top put hand on it.    Symptom Nature Motion provoked    Aggravating Factors Turning head quickly;Turning body quickly    Relieving Factors Head stationary;Slow movements    Progression of Symptoms Worse   worse over last few months   History of similar episodes Family has a history of dizziness      Oculomotor Exam   Oculomotor Alignment Normal    Ocular ROM WNL    Spontaneous Absent    Gaze-induced  Absent    Head shaking Horizontal Comment   absent, pt reports dizziness   Head Shaking Vertical Comment   absent, pt reports dizziness but less than horizontal   Smooth Pursuits Comment   overshoots with downward gaze   Saccades Comment   pt does not allow enough neck ROM   Comment pt motions provockes looking up more than down and side to side      Other Tests   Hyperventilation negative      Cognition   Cognition Orientation Level Oriented x 4      Positional Sensitivities   Head Turning x 5 Moderate dizziness    Head  Nodding x 5 Mild dizziness    Pivot Right in Standing Moderate dizziness    Pivot Left in Standing Mild dizziness           Mobility  Bed Mobility Overal bed mobility: Modified Independent         Transfers Overall transfer level: Needs assistance Equipment used: None Transfers: Sit to/from Stand Sit to Stand: Min guard;Supervision         General transfer comment: pt using bed rail to steady self after  rising, slight c/o dizziness  Ambulation/Gait Ambulation/Gait assistance: Min guard;Supervision Gait Distance (Feet): 360 Feet Assistive device: None Gait Pattern/deviations: Step-through pattern;Decreased step length - right;Decreased step length - left;Shuffle;Wide base of support Gait velocity: decr   General Gait Details: pt with mild gait deviations and slight instability. no overt LOB noted. pt denied dizziness with forward gait, reported incresaed dizziness with turning Rt vs Lt. Pt with no arm swing during gait but improved with distance.   Stairs             Wheelchair Mobility    Modified Rankin (Stroke Patients Only)       Balance Overall balance assessment: Mild deficits observed, not formally tested        Standardized Balance Assessment Standardized Balance Assessment : Dynamic Gait Index   Dynamic Gait Index Level Surface: Mild Impairment Change in Gait Speed: Mild Impairment Gait with Horizontal Head Turns: Moderate Impairment Gait with Vertical Head Turns: Mild Impairment Gait and Pivot Turn: Mild Impairment Step Over Obstacle: Normal Step Around Obstacles: Normal Steps: Mild Impairment (discussed withpt, not formally tested) Total Score: 17      Cognition Arousal/Alertness: Awake/alert Behavior During Therapy: WFL for tasks assessed/performed Overall Cognitive Status: Within Functional Limits for tasks assessed           Exercises      General Comments        Pertinent Vitals/Pain Pain Assessment: No/denies pain Pain Score: 0-No pain Pain Intervention(s): Monitored during session           PT Goals (current goals can now be found in the care plan section) Acute Rehab PT Goals Patient Stated Goal: go home today PT Goal Formulation: With patient Time For Goal Achievement: 12/18/19 Potential to Achieve Goals: Good Progress towards PT goals: Progressing toward goals    Frequency    Min 3X/week      PT Plan Current  plan remains appropriate       AM-PAC PT "6 Clicks" Mobility   Outcome Measure  Help needed turning from your back to your side while in a flat bed without using bedrails?: None Help needed moving from lying on your back to sitting on the side of a flat bed without using bedrails?: None Help needed moving to and from a bed to a chair (including a wheelchair)?: A Little Help needed standing up from a chair using your arms (e.g., wheelchair or bedside chair)?: A Little Help needed to walk in hospital room?: A Little Help needed climbing 3-5 steps with a railing? : A Little 6 Click Score: 20    End of Session Equipment Utilized During Treatment: Gait belt Activity Tolerance: Patient tolerated treatment well Patient left: with call bell/phone within reach;in chair;with chair alarm set Nurse Communication: Mobility status PT Visit Diagnosis: Difficulty in walking, not elsewhere classified (R26.2);Dizziness and giddiness (R42)     Time: 9381-8299 PT Time Calculation (min) (ACUTE ONLY): 38 min  Charges:  $Therapeutic Activity: 8-22 mins $Physical Performance Test:  23-37 mins                     Wynn Maudlin, DPT Acute Rehabilitation Services  Office [phone] Pager [phone]  12/04/2019 5:34 PM

## 2019-12-04 NOTE — TOC Initial Note (Signed)
Transition of Care [facility]) - Initial/Assessment Note    Patient Details  Name: Megan Logan MRN: [id number] Date of Birth: November 14, 1940  Transition of Care [facility]) CM/SW Contact:    Golda Acre, RN Phone Number: 12/04/2019, 8:26 AM  Clinical Narrative:                 s-nausea and vomiting, iv ns at 125cc/hrs, na =130, total protein 7.6,  p-home, has pcp, should return to home with husband, will follow for progression and toc needs.  Expected Discharge Plan: Home/Self Care Barriers to Discharge: Continued Medical Work up   Patient Goals and CMS Choice Patient states their goals for this hospitalization and ongoing recovery are:: to return to my home and get better CMS Medicare.gov Compare Post Acute Care list provided to:: Patient    Expected Discharge Plan and Services Expected Discharge Plan: Home/Self Care   Discharge Planning Services: CM Consult   Living arrangements for the past 2 months: Single Family Home                                      Prior Living Arrangements/Services Living arrangements for the past 2 months: Single Family Home Lives with:: Spouse Patient language and need for interpreter reviewed:: Yes Do you feel safe going back to the place where you live?: Yes      Need for Family Participation in Patient Care: Yes (Comment) Care giver support system in place?: Yes (comment)   Criminal Activity/Legal Involvement Pertinent to Current Situation/Hospitalization: No - Comment as needed  Activities of Daily Living Home Assistive Devices/Equipment: Eyeglasses ADL Screening (condition at time of admission) Patient's cognitive ability adequate to safely complete daily activities?: Yes Is the patient deaf or have difficulty hearing?: No Does the patient have difficulty seeing, even when wearing glasses/contacts?: No Does the patient have difficulty concentrating, remembering, or making decisions?: No Patient able to express need for assistance  with ADLs?: Yes Does the patient have difficulty dressing or bathing?: No Independently performs ADLs?: Yes (appropriate for developmental age) Does the patient have difficulty walking or climbing stairs?: Yes (secondary to arthritis) Weakness of Legs: Both Weakness of Arms/Hands: None  Permission Sought/Granted                  Emotional Assessment Appearance:: Appears stated age Attitude/Demeanor/Rapport: Engaged Affect (typically observed): Calm Orientation: : Oriented to Place, Oriented to Self, Oriented to  Time, Oriented to Situation Alcohol / Substance Use: Not Applicable Psych Involvement: No (comment)  Admission diagnosis:  Dizziness [R42] Hyponatremia [E87.1] Patient Active Problem List   Diagnosis Date Noted  . Hyponatremia 12/03/2019  . Dizziness 12/03/2019  . Ketonuria 12/03/2019  . Metabolic acidosis 12/03/2019  . DDD (degenerative disc disease), cervical 12/15/2018  . Dyslipidemia 12/15/2018  . History of asthma 12/15/2018  . 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 Unspecified vitamin D deficiency 08/06/2013  . Age-related osteoporosis without current pathological fracture 08/04/2012  . 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   PCP:  Barbie Banner, MD Pharmacy:   [facility] [phone] - [place], [place] - [phone] [place] [address] Phone: [phone] Fax: [phone]     Social Determinants of Health (SDOH) Interventions    Readmission Risk Interventions No flowsheet data found.

## 2019-12-05 DIAGNOSIS — H8303 Labyrinthitis, bilateral: Secondary | ICD-10-CM

## 2019-12-05 LAB — BASIC METABOLIC PANEL
Anion gap: 9 (ref 5–15)
BUN: 8 mg/dL (ref 8–23)
CO2: 24 mmol/L (ref 22–32)
Calcium: 7.8 mg/dL — ABNORMAL LOW (ref 8.9–10.3)
Chloride: 102 mmol/L (ref 98–111)
Creatinine, Ser: 0.5 mg/dL (ref 0.44–1.00)
GFR calc Af Amer: >60 mL/min (ref >60)
GFR calc non Af Amer: >60 mL/min (ref >60)
Glucose, Bld: 137 mg/dL — ABNORMAL HIGH (ref 70–99)
Potassium: 3.3 mmol/L — ABNORMAL LOW (ref 3.5–5.1)
Sodium: 135 mmol/L (ref 135–145)

## 2019-12-05 MED ORDER — POTASSIUM CHLORIDE CRYS ER 20 MEQ PO TBCR
40.0000 meq | EXTENDED_RELEASE_TABLET | Freq: Once | ORAL | Status: AC
  Administered 2019-12-05: 40 meq via ORAL
  Filled 2019-12-05: qty 2

## 2019-12-05 MED ORDER — SALINE SPRAY 0.65 % NA SOLN: 2.0000 | NASAL | 0 refills | Status: AC

## 2019-12-05 NOTE — Discharge Summary (Signed)
Physician Discharge Summary  Megan SaxBonnie M Clauss [id number] DOB: November 07, 1940 DOA: 12/03/2019  PCP: Barbie BannerWilson, Fred H, MD  Admit date: 12/03/2019 Discharge date: 12/05/2019  Recommendations for Outpatient Follow-up:  1. Discharge to home 2. Follow up with PCP in 7-10 days.  Discharge Diagnoses: Principal diagnosis is #1 1. Hyponatremia 2. Volume depletion, dehydration 3.  FTT 4. Nausea and vomiting 5. Hyperlipidemia 6. Concern for Lyme disease: ruled out. 7. Vestibulitis  Discharge Condition: Fair  Disposition: Home  Diet recommendation: Regular  Filed Weights   12/03/19 0926  Weight: 46.3 kg   History of present illness:   Megan Logan is a 79 y.o. female with medical history significant for anemia, asthma, history of colitis, osteoporosis, hyperlipidemia comes to the ER with multiple complaint of nausea vomiting headache dizziness. She started with headache 2 wks ago, started pseudophed, nose spray, had sinus infection a month ago took z pack for that. Last Sunday she started getting dizzy, had vomiting x5,6 and had bile coming out. Was seen by PCP on Monday- was given nausea meds and doxy- made it more sick and meds changed to Byaxin, but did not help. Got up this am feeling weak, dizzy and headache. She has not been eating well. Patient reports nausea, vomiting, chills but denies chest pain, shortness of breath, fever, focal weakness, numbness tingling, speech difficulties, vision difficulties. She reprots she had "bulld eyes" rash fom rinsect/tick bite- took doxycycline for 4 days and rash went away ( it was 3 wks ago).  ED Course: Vitals are stable, blood work showed hyponatremia at 126, bicarb 20 slightly abnormally total bilirubin 1.8, patient was still dizzy and symptomatic and MRI brain was obtained that did not show any acute stroke or any other acute finding.  Admission was requested for further management of her dehydration dizziness and hyponatremia.  She has had Covid  vaccine and Covid swab is pending in the ED. She feels better overall now.  Hospital Course:  The patient was admitted to a medical bed. She has been treated with astelin nasal spray and nasal saline. She was also given iV fluids. Her sodium was corrected and she was evaluated by PT/OT. It appears that her dizziness has resolved. They have not recommended any further PT. The patient is feeling well and will be discharged to home in fair condition.  Today's assessment: S: The patient is sitting up at bedside. No new complaints. O: Vitals:  Vitals:   12/05/19 0607 12/05/19 1311  BP: 124/73 (!) 136/77  Pulse: 66 75  Resp: 15 16  Temp: 98 F (36.7 C) 97.7 F (36.5 C)  SpO2: 96% 100%   Exam:  Constitutional:   The patient is awake, alert, and oriented x 3. No acute distress. Respiratory:   No increased work of breathing.  No wheezes, rales, or rhonchi  No tactile fremitus Cardiovascular:   Regular rate and rhythm  No murmurs, ectopy, or gallups.  No lateral PMI. No thrills. Abdomen:   Abdomen is soft, non-tender, non-distended  No hernias, masses, or organomegaly  Normoactive bowel sounds.  Musculoskeletal:   No cyanosis, clubbing, or edema Skin:   No rashes, lesions, ulcers  palpation of skin: no induration or nodules Neurologic:   CN 2-12 intact  Sensation all 4 extremities intact Psychiatric:   Mental status ? Mood, affect appropriate ? Orientation to person, place, time   judgment and insight appear intact  Discharge Instructions  Discharge Instructions    Activity as tolerated - No restrictions  Complete by: As directed    Call MD for:  persistant dizziness or light-headedness   Complete by: As directed    Call MD for:  persistant nausea and vomiting   Complete by: As directed    Call MD for:  temperature >100.4   Complete by: As directed    Diet - low sodium heart healthy   Complete by: As directed    Discharge instructions   Complete  by: As directed    Discharge to home Follow up with PCP in 7-10 days.   Increase activity slowly   Complete by: As directed      Allergies as of 12/05/2019      Reactions   Cephalexin    Other reaction(s): Other (See Comments) un   Erythromycin Base Other (See Comments)   un   Hydrocodone-homatropine Nausea Only   Levofloxacin Other (See Comments)   insomnia   Penicillins Swelling   Prednisone    Heart palpitations, insomnia   Sulfamethoxazole-trimethoprim Other (See Comments)   un   Augmentin [amoxicillin-pot Clavulanate] Nausea Only      Medication List    STOP taking these medications   clarithromycin 500 MG tablet Commonly known as: BIAXIN     TAKE these medications   acetaminophen 325 MG tablet Commonly known as: TYLENOL Take 650 mg by mouth every 6 (six) hours as needed for mild pain or headache.   azelastine 0.1 % nasal spray Commonly known as: ASTELIN Place 1 spray into both nostrils 2 (two) times daily. Use in each nostril as directed   Breo Ellipta 200-25 MCG/INH Aepb Generic drug: fluticasone furoate-vilanterol Inhale 1 puff into the lungs daily.   calcium carbonate 600 MG Tabs tablet Commonly known as: OS-CAL Take 600 mg by mouth 2 (two) times daily with a meal.   celecoxib 200 MG capsule Commonly known as: CELEBREX Take 200 mg by mouth daily.   cholecalciferol 1000 units tablet Commonly known as: VITAMIN D Take 1 capsule by mouth daily.   denosumab 60 MG/ML Sosy injection Commonly known as: PROLIA Inject 60 mg into the skin every 6 (six) months. Notes to patient: As Scheduled   fluticasone 50 MCG/ACT nasal spray Commonly known as: FLONASE Place into both nostrils daily.   gabapentin 300 MG capsule Commonly known as: NEURONTIN Take 300 mg by mouth at bedtime.   ondansetron 4 MG disintegrating tablet Commonly known as: ZOFRAN-ODT Take 4 mg by mouth every 8 (eight) hours as needed for nausea/vomiting.   pantoprazole 40 MG  tablet Commonly known as: PROTONIX Take 40 mg by mouth daily.   pramipexole 1 MG tablet Commonly known as: MIRAPEX Take 1 tablet by mouth at bedtime.   pseudoephedrine 120 MG 12 hr tablet Commonly known as: SUDAFED Take 120 mg by mouth every 12 (twelve) hours as needed for congestion.   simvastatin 20 MG tablet Commonly known as: ZOCOR Take 20 mg by mouth daily at 6 PM.   sodium chloride 0.65 % Soln nasal spray Commonly known as: OCEAN Place 2 sprays into both nostrils every 4 (four) hours.      Allergies  Allergen Reactions  . Cephalexin     Other reaction(s): Other (See Comments) un  . Erythromycin Base Other (See Comments)    un  . Hydrocodone-Homatropine Nausea Only  . Levofloxacin Other (See Comments)    insomnia  . Penicillins Swelling  . Prednisone     Heart palpitations, insomnia  . Sulfamethoxazole-Trimethoprim Other (See Comments)    un  .  Augmentin [Amoxicillin-Pot Clavulanate] Nausea Only    The results of significant diagnostics from this hospitalization (including imaging, microbiology, ancillary and laboratory) are listed below for reference.    Significant Diagnostic Studies: MR BRAIN WO CONTRAST  Result Date: 12/03/2019 CLINICAL DATA:  Headache, nausea, vomiting EXAM: MRI HEAD WITHOUT CONTRAST TECHNIQUE: Multiplanar, multiecho pulse sequences of the brain and surrounding structures were obtained without intravenous contrast. COMPARISON:  None. FINDINGS: Brain: There is no acute infarction or intracranial hemorrhage. There is no intracranial mass, mass effect, or edema. There is no hydrocephalus or extra-axial fluid collection. Ventricles and sulci are within normal limits in size and configuration. Scattered foci of T2 hyperintensity in the supratentorial white matter are nonspecific but may reflect mild chronic microvascular ischemic changes. Vascular: Major vessel flow voids at the skull base are preserved. Skull and upper cervical spine: Degenerative  changes at C1-C2. Normal marrow signal is preserved. Sinuses/Orbits: Minor mucosal thickening.  Orbits are unremarkable. Other: Sella is unremarkable.  Mastoid air cells are clear. IMPRESSION: No evidence of recent infarction, hemorrhage, or mass. Mild chronic microvascular ischemic changes. Electronically Signed   By: Guadlupe Spanish M.D.   On: 12/03/2019 11:36    Microbiology: Recent Results (from the past 240 hour(s))  SARS Coronavirus 2 by RT PCR (hospital order, performed in [facility] hospital lab) Nasopharyngeal Nasopharyngeal Swab     Status: None   Collection Time: 12/03/19  1:33 PM   Specimen: Nasopharyngeal Swab  Result Value Ref Range Status   SARS Coronavirus 2 NEGATIVE NEGATIVE Final    Comment: (NOTE) SARS-CoV-2 target nucleic acids are NOT DETECTED.  The SARS-CoV-2 RNA is generally detectable in upper and lower respiratory specimens during the acute phase of infection. The lowest concentration of SARS-CoV-2 viral copies this assay can detect is 250 copies / mL. A negative result does not preclude SARS-CoV-2 infection and should not be used as the sole basis for treatment or other patient management decisions.  A negative result may occur with improper specimen collection / handling, submission of specimen other than nasopharyngeal swab, presence of viral mutation(s) within the areas targeted by this assay, and inadequate number of viral copies (<250 copies / mL). A negative result must be combined with clinical observations, patient history, and epidemiological information.  Fact Sheet for Patients:   BoilerBrush.com.cy  Fact Sheet for Healthcare Providers: https://pope.com/  This test is not yet approved or  cleared by the [place] FDA and has been authorized for detection and/or diagnosis of SARS-CoV-2 by FDA under an Emergency Use Authorization (EUA).  This EUA will remain in effect (meaning this test can be  used) for the duration of the COVID-19 declaration under Section 564(b)(1) of the Act, 21 U.S.C. section 360bbb-3(b)(1), unless the authorization is terminated or revoked sooner.  Performed at [facility], [address], [place], [place] [address]      Labs: Basic Metabolic Panel: Recent Labs  Lab 12/03/19 1150 12/04/19 0605 12/05/19 0942  NA 126* 130* 135  K 4.9 3.5 3.3*  CL 94* 103 102  CO2 20* 21* 24  GLUCOSE 99 81 137*  BUN 16 10 8   CREATININE 0.66 0.55 0.50  CALCIUM 8.1* 7.6* 7.8*   Liver Function Tests: Recent Labs  Lab 12/03/19 1150 12/04/19 0605  AST 38 18  ALT 20 17  ALKPHOS 61 49  BILITOT 1.8* 1.3*  PROT 6.6 5.2*  ALBUMIN 4.2 3.3*   No results for input(s): LIPASE, AMYLASE in the last 168 hours. No results  for input(s): AMMONIA in the last 168 hours. CBC: Recent Labs  Lab 12/03/19 1026 12/04/19 0605  WBC 9.5 5.2  HGB 14.7 12.3  HCT 42.4 36.5  MCV 91.0 92.4  PLT 212 239   Cardiac Enzymes: No results for input(s): CKTOTAL, CKMB, CKMBINDEX, TROPONINI in the last 168 hours. BNP: BNP (last 3 results) No results for input(s): BNP in the last 8760 hours.  ProBNP (last 3 results) No results for input(s): PROBNP in the last 8760 hours.  CBG: No results for input(s): GLUCAP in the last 168 hours.  Active Problems:   Dyslipidemia   History of asthma   Hyponatremia   Dizziness   Ketonuria   Metabolic acidosis   Volume depletion, gastrointestinal loss   Time coordinating discharge: 38 minutes.  Signed:        Veleta Yamamoto, DO [facility]  12/05/2019, 6:28 PM

## 2019-12-05 NOTE — Progress Notes (Signed)
Patient discharged to home with husband, discharge instructions reviewed with patient who verbalized understanding.  

## 2019-12-08 LAB — LYME DISEASE DNA BY PCR(BORRELIA BURG): Lyme Disease(B.burgdorferi)PCR: NEGATIVE

## 2019-12-14 ENCOUNTER — Telehealth: Payer: Self-pay | Admitting: Pharmacist

## 2019-12-14 DIAGNOSIS — Z5181 Encounter for therapeutic drug level monitoring: Secondary | ICD-10-CM

## 2019-12-14 DIAGNOSIS — M810 Age-related osteoporosis without current pathological fracture: Secondary | ICD-10-CM

## 2019-12-14 NOTE — Telephone Encounter (Signed)
LVM to notify patient she is due for Prolia on or after 8/26 and will be due for lab work later this week.  Requested a return call with any questions or concerns.   Verlin Fester, PharmD, [place], CPP Clinical Specialty Pharmacist (Rheumatology and Pulmonology)  12/14/2019 11:37 AM

## 2019-12-22 ENCOUNTER — Other Ambulatory Visit (HOSPITAL_COMMUNITY): Payer: Self-pay | Admitting: Obstetrics and Gynecology

## 2019-12-22 DIAGNOSIS — Z1231 Encounter for screening mammogram for malignant neoplasm of breast: Secondary | ICD-10-CM

## 2019-12-23 ENCOUNTER — Other Ambulatory Visit: Payer: Self-pay | Admitting: *Deleted

## 2019-12-23 DIAGNOSIS — Z5181 Encounter for therapeutic drug level monitoring: Secondary | ICD-10-CM

## 2019-12-24 LAB — COMPLETE METABOLIC PANEL WITH GFR
AG Ratio: 2.4 (calc) (ref 1.0–2.5)
ALT: 15 U/L (ref 6–29)
AST: 20 U/L (ref 10–35)
Albumin: 4.1 g/dL (ref 3.6–5.1)
Alkaline phosphatase (APISO): 74 U/L (ref 37–153)
BUN: 13 mg/dL (ref 7–25)
CO2: 30 mmol/L (ref 20–32)
Calcium: 9.4 mg/dL (ref 8.6–10.4)
Chloride: 100 mmol/L (ref 98–110)
Creat: 0.74 mg/dL (ref 0.60–0.93)
GFR, Est African American: 91 mL/min/{1.73_m2} (ref >=60)
GFR, Est Non African American: 78 mL/min/{1.73_m2} (ref >=60)
Globulin: 1.7 g/dL (calc) — ABNORMAL LOW (ref 1.9–3.7)
Glucose, Bld: 85 mg/dL (ref 65–99)
Potassium: 4.4 mmol/L (ref 3.5–5.3)
Sodium: 134 mmol/L — ABNORMAL LOW (ref 135–146)
Total Bilirubin: 0.8 mg/dL (ref 0.2–1.2)
Total Protein: 5.8 g/dL — ABNORMAL LOW (ref 6.1–8.1)

## 2019-12-24 LAB — CBC WITH DIFFERENTIAL/PLATELET
Absolute Monocytes: 718 cells/uL (ref 200–950)
Basophils Absolute: 40 cells/uL (ref 0–200)
Basophils Relative: 0.7 %
Eosinophils Absolute: 68 cells/uL (ref 15–500)
Eosinophils Relative: 1.2 %
HCT: 38.5 % (ref 35.0–45.0)
Hemoglobin: 12.5 g/dL (ref 11.7–15.5)
Lymphs Abs: 969 cells/uL (ref 850–3900)
MCH: 31.2 pg (ref 27.0–33.0)
MCHC: 32.5 g/dL (ref 32.0–36.0)
MCV: 96 fL (ref 80.0–100.0)
MPV: 10 fL (ref 7.5–12.5)
Monocytes Relative: 12.6 %
Neutro Abs: 3905 cells/uL (ref 1500–7800)
Neutrophils Relative %: 68.5 %
Platelets: 310 10*3/uL (ref 140–400)
RBC: 4.01 10*6/uL (ref 3.80–5.10)
RDW: 12.9 % (ref 11.0–15.0)
Total Lymphocyte: 17 %
WBC: 5.7 10*3/uL (ref 3.8–10.8)

## 2019-12-24 MED ORDER — DENOSUMAB 60 MG/ML ~~LOC~~ SOSY: 60.0000 mg | PREFILLED_SYRINGE | SUBCUTANEOUS | 0 refills | Status: DC

## 2019-12-24 NOTE — Progress Notes (Signed)
CBC within normal limits and CMP stable.  She is due for her Prolia injeciton.  Prescription sent to the pharmacy and she may schedule appointment for administration on or after 8/26.

## 2019-12-24 NOTE — Telephone Encounter (Signed)
CBC/CMP within normal limits to proceed with Prolia.  Prescription sent to [facility] to be delivered to clinic prior to 8/26.  Verlin Fester, PharmD, [place], CPP Clinical Specialty Pharmacist (Rheumatology and Pulmonology)  12/24/2019 8:15 AM

## 2019-12-28 MED FILL — PROLIA 60 MG/ML SOLN: 60 | 180 days supply | Qty: 1 | Fill #0

## 2019-12-30 NOTE — Telephone Encounter (Signed)
Prolia injection received from [facility] yesterday and placed in fridge.  Patient has an appointment for administration tomorrow.   Verlin Fester, PharmD, [place], CPP Clinical Specialty Pharmacist (Rheumatology and Pulmonology)  12/30/2019 10:39 AM

## 2019-12-31 ENCOUNTER — Ambulatory Visit (INDEPENDENT_AMBULATORY_CARE_PROVIDER_SITE_OTHER): Payer: Medicare PPO | Admitting: Pharmacist

## 2019-12-31 ENCOUNTER — Other Ambulatory Visit: Payer: Self-pay

## 2019-12-31 DIAGNOSIS — M810 Age-related osteoporosis without current pathological fracture: Secondary | ICD-10-CM

## 2019-12-31 MED ORDER — DENOSUMAB 60 MG/ML ~~LOC~~ SOSY
60.0000 mg | PREFILLED_SYRINGE | Freq: Once | SUBCUTANEOUS | Status: AC
  Administered 2019-12-31: 60 mg via SUBCUTANEOUS
  Filled 2019-12-31: qty 1

## 2019-12-31 NOTE — Patient Instructions (Signed)
Reduce your calcium supplement to 1 tablet daily.

## 2019-12-31 NOTE — Progress Notes (Signed)
Pharmacy Note  Subjective:   Patient presents to clinic today to receive bi-annual dose of Prolia.  Patient running a fever or have signs/symptoms of infection? No  Patient currently on antibiotics for the treatment of infection? No  Patient had fall in the last 6 months?  No  If yes, did it require medical attention? No   Patient taking calcium 1200 mg daily through diet or supplement and at least 800 units vitamin D? Yes  Objective: CMP     Component Value Date/Time   NA 134 (L) 12/23/2019 1049   NA 141 10/31/2017 1415   K 4.4 12/23/2019 1049   CL 100 12/23/2019 1049   CO2 30 12/23/2019 1049   GLUCOSE 85 12/23/2019 1049   BUN 13 12/23/2019 1049   BUN 24 10/31/2017 1415   CREATININE 0.74 12/23/2019 1049   CALCIUM 9.4 12/23/2019 1049   PROT 5.8 (L) 12/23/2019 1049   PROT 5.9 (L) 10/31/2017 1415   ALBUMIN 3.3 (L) 12/04/2019 0605   ALBUMIN 4.3 10/31/2017 1415   AST 20 12/23/2019 1049   ALT 15 12/23/2019 1049   ALKPHOS 49 12/04/2019 0605   BILITOT 0.8 12/23/2019 1049   BILITOT 0.5 10/31/2017 1415   GFRNONAA 78 12/23/2019 1049   GFRAA 91 12/23/2019 1049    CBC    Component Value Date/Time   WBC 5.7 12/23/2019 1049   RBC 4.01 12/23/2019 1049   HGB 12.5 12/23/2019 1049   HGB 12.0 10/31/2017 1415   HGB 12.4 08/06/2013 1128   HCT 38.5 12/23/2019 1049   HCT 37.6 10/31/2017 1415   PLT 310 12/23/2019 1049   PLT 292 10/31/2017 1415   MCV 96.0 12/23/2019 1049   MCV 93 10/31/2017 1415   MCH 31.2 12/23/2019 1049   MCHC 32.5 12/23/2019 1049   RDW 12.9 12/23/2019 1049   RDW 13.5 10/31/2017 1415   LYMPHSABS 969 12/23/2019 1049   EOSABS 68 12/23/2019 1049   BASOSABS 40 12/23/2019 1049    Lab Results  Component Value Date   VD25OH 30 12/15/2018    T-score: -2.8 at AP spine  Assessment/Plan:   Administrations This Visit    denosumab (PROLIA) injection 60 mg    Admin Date 12/31/2019 Action Given Dose 60 mg Route Subcutaneous Administered By Verlin Fester C,  RPH-CPP         Patient tolerated injection without issue.   Patient is to return in 10-14 days for labs to monitor for hypocalcemia.  Future orders placed.  Patient is taking calcium carbonate 600 mg twice daily.  She also eats 2 servings of dairy daily.  Recommend decreasing calcium carbonate to once daily to reduce risk of kidney stones and CV effects.  Patient verbalized understanding.   All questions encouraged and answered.  Instructed patient to call with any further questions or concerns.  Verlin Fester, PharmD, [place], CPP Clinical Specialty Pharmacist (Rheumatology and Pulmonology)  12/31/2019 10:52 AM

## 2020-01-04 NOTE — Progress Notes (Deleted)
Office Visit Note  Patient: Megan Logan             Date of Birth: 04/24/1941           MRN: [id number]             PCP: Barbie Banner, MD Referring: Barbie Banner, MD Visit Date: 01/18/2020 Occupation: @GUAROCC @  Subjective:  No chief complaint on file.   History of Present Illness: Megan Logan is a 79 y.o. female ***   Activities of Daily Living:  Patient reports morning stiffness for *** {minute/hour:19697}.   Patient {ACTIONS;DENIES/REPORTS:21021675::"Denies"} nocturnal pain.  Difficulty dressing/grooming: {ACTIONS;DENIES/REPORTS:21021675::"Denies"} Difficulty climbing stairs: {ACTIONS;DENIES/REPORTS:21021675::"Denies"} Difficulty getting out of chair: {ACTIONS;DENIES/REPORTS:21021675::"Denies"} Difficulty using hands for taps, buttons, cutlery, and/or writing: {ACTIONS;DENIES/REPORTS:21021675::"Denies"}  No Rheumatology ROS completed.   PMFS History:  Patient Active Problem List   Diagnosis Date Noted  . Volume depletion, gastrointestinal loss 12/04/2019  . Hyponatremia 12/03/2019  . Dizziness 12/03/2019  . Ketonuria 12/03/2019  . Metabolic acidosis 12/03/2019  . DDD (degenerative disc disease), cervical 12/15/2018  . Dyslipidemia 12/15/2018  . History of asthma 12/15/2018  . 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 Unspecified vitamin D deficiency 08/06/2013  . Age-related osteoporosis without current pathological fracture 08/04/2012  . 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 Anemia    diet related  . Asthma   . Atrophic vaginitis   . Colitis   . Hx of endometriosis    took Danacrine  . Hypercholesterolemia   . Osteoporosis    spine off Actonel sinsce 09/2010- Drug Holiday  . Premature ovarian failure age 58   HRT age 79 - 12/2000    Family History  Problem Relation Age of Onset  . Prostate cancer Father 34  . Alzheimer's disease Mother 80  . Other Sister        dizziness  . Cancer Sister 16        Brain and Lung  . Ulcerative colitis Sister   . Migraines Sister   . Cancer - Colon Maternal Grandfather 51  . Irritable bowel syndrome Daughter   . Colitis Daughter    Past Surgical History:  Procedure Laterality Date  . HYSTEROSCOPY  1996   DUB--wnl  . NASAL SINUS SURGERY  40's and 50's  . TUBAL LIGATION Bilateral 1982   Social History   Social History Narrative  . Not on file   Immunization History  Administered Date(s) Administered  . Influenza,inj,Quad PF,6+ Mos 02/26/2014  . Pneumococcal Conjugate-13 05/29/2015  . Pneumococcal Polysaccharide-23 03/04/2013  . Tdap 09/29/2015  . Zoster 05/07/2007     Objective: Vital Signs: LMP 05/07/1982 (Approximate)    Physical Exam   Musculoskeletal Exam: ***  CDAI Exam: CDAI Score: -- Patient Global: --; Provider Global: -- Swollen: --; Tender: -- Joint Exam 01/18/2020   No joint exam has been documented for this visit   There is currently no information documented on the homunculus. Go to the Rheumatology activity and complete the homunculus joint exam.  Investigation: No additional findings.  Imaging: No results found.  Recent Labs: Lab Results  Component Value Date   WBC 5.7 12/23/2019   HGB 12.5 12/23/2019   PLT 310 12/23/2019   NA 134 (L) 12/23/2019   K 4.4 12/23/2019   CL 100 12/23/2019   CO2 30 12/23/2019   GLUCOSE 85 12/23/2019   BUN 13 12/23/2019   CREATININE 0.74 12/23/2019   BILITOT 0.8 12/23/2019   ALKPHOS  49 12/04/2019   AST 20 12/23/2019   ALT 15 12/23/2019   PROT 5.8 (L) 12/23/2019   ALBUMIN 3.3 (L) 12/04/2019   CALCIUM 9.4 12/23/2019   GFRAA 91 12/23/2019    Speciality Comments: No specialty comments available.  Procedures:  No procedures performed Allergies: Cephalexin, Erythromycin base, Hydrocodone-homatropine, Levofloxacin, Penicillins, Prednisone, Sulfamethoxazole-trimethoprim, and Augmentin [amoxicillin-pot clavulanate]   Assessment / Plan:     Visit Diagnoses: No  diagnosis found.  Orders: No orders of the defined types were placed in this encounter.  No orders of the defined types were placed in this encounter.   Face-to-face time spent with patient was *** minutes. Greater than 50% of time was spent in counseling and coordination of care.  Follow-Up Instructions: No follow-ups on file.   Ellen Henri, CMA  Note - This record has been created using Animal nutritionist.  Chart creation errors have been sought, but may not always  have been located. Such creation errors do not reflect on  the standard of medical care.

## 2020-01-05 DIAGNOSIS — H903 Sensorineural hearing loss, bilateral: Secondary | ICD-10-CM | POA: Insufficient documentation

## 2020-01-18 ENCOUNTER — Ambulatory Visit: Payer: Medicare PPO | Admitting: Rheumatology

## 2020-01-18 ENCOUNTER — Ambulatory Visit (HOSPITAL_COMMUNITY): Payer: Medicare PPO

## 2020-01-19 ENCOUNTER — Other Ambulatory Visit: Payer: Self-pay

## 2020-01-19 ENCOUNTER — Ambulatory Visit: Payer: Medicare PPO | Admitting: Rheumatology

## 2020-01-19 ENCOUNTER — Encounter: Payer: Self-pay | Admitting: Rheumatology

## 2020-01-19 VITALS — BP 156/79 | HR 67 | Resp 13 | Ht 60.5 in | Wt 104.0 lb

## 2020-01-19 DIAGNOSIS — M19041 Primary osteoarthritis, right hand: Secondary | ICD-10-CM

## 2020-01-19 DIAGNOSIS — M19042 Primary osteoarthritis, left hand: Secondary | ICD-10-CM

## 2020-01-19 DIAGNOSIS — M5030 Other cervical disc degeneration, unspecified cervical region: Secondary | ICD-10-CM

## 2020-01-19 DIAGNOSIS — E559 Vitamin D deficiency, unspecified: Secondary | ICD-10-CM | POA: Diagnosis not present

## 2020-01-19 DIAGNOSIS — M1711 Unilateral primary osteoarthritis, right knee: Secondary | ICD-10-CM | POA: Diagnosis not present

## 2020-01-19 DIAGNOSIS — E785 Hyperlipidemia, unspecified: Secondary | ICD-10-CM

## 2020-01-19 DIAGNOSIS — M810 Age-related osteoporosis without current pathological fracture: Secondary | ICD-10-CM

## 2020-01-19 DIAGNOSIS — Z8709 Personal history of other diseases of the respiratory system: Secondary | ICD-10-CM

## 2020-01-19 DIAGNOSIS — Z5181 Encounter for therapeutic drug level monitoring: Secondary | ICD-10-CM

## 2020-01-19 DIAGNOSIS — Z7189 Other specified counseling: Secondary | ICD-10-CM

## 2020-01-19 DIAGNOSIS — N952 Postmenopausal atrophic vaginitis: Secondary | ICD-10-CM

## 2020-01-19 DIAGNOSIS — Z8719 Personal history of other diseases of the digestive system: Secondary | ICD-10-CM

## 2020-01-19 DIAGNOSIS — E2839 Other primary ovarian failure: Secondary | ICD-10-CM

## 2020-01-19 LAB — CBC WITH DIFFERENTIAL/PLATELET
Absolute Monocytes: 774 cells/uL (ref 200–950)
Basophils Absolute: 50 cells/uL (ref 0–200)
Basophils Relative: 0.7 %
Eosinophils Absolute: 50 cells/uL (ref 15–500)
Eosinophils Relative: 0.7 %
HCT: 38.9 % (ref 35.0–45.0)
Hemoglobin: 12.7 g/dL (ref 11.7–15.5)
Lymphs Abs: 973 cells/uL (ref 850–3900)
MCH: 31.1 pg (ref 27.0–33.0)
MCHC: 32.6 g/dL (ref 32.0–36.0)
MCV: 95.3 fL (ref 80.0–100.0)
MPV: 9.8 fL (ref 7.5–12.5)
Monocytes Relative: 10.9 %
Neutro Abs: 5254 cells/uL (ref 1500–7800)
Neutrophils Relative %: 74 %
Platelets: 315 10*3/uL (ref 140–400)
RBC: 4.08 10*6/uL (ref 3.80–5.10)
RDW: 11.9 % (ref 11.0–15.0)
Total Lymphocyte: 13.7 %
WBC: 7.1 10*3/uL (ref 3.8–10.8)

## 2020-01-19 LAB — COMPLETE METABOLIC PANEL WITH GFR
AG Ratio: 2.6 (calc) — ABNORMAL HIGH (ref 1.0–2.5)
ALT: 16 U/L (ref 6–29)
AST: 27 U/L (ref 10–35)
Albumin: 4.4 g/dL (ref 3.6–5.1)
Alkaline phosphatase (APISO): 55 U/L (ref 37–153)
BUN: 14 mg/dL (ref 7–25)
CO2: 28 mmol/L (ref 20–32)
Calcium: 9 mg/dL (ref 8.6–10.4)
Chloride: 99 mmol/L (ref 98–110)
Creat: 0.69 mg/dL (ref 0.60–0.93)
GFR, Est African American: 97 mL/min/{1.73_m2} (ref >=60)
GFR, Est Non African American: 84 mL/min/{1.73_m2} (ref >=60)
Globulin: 1.7 g/dL (calc) — ABNORMAL LOW (ref 1.9–3.7)
Glucose, Bld: 86 mg/dL (ref 65–99)
Potassium: 4.7 mmol/L (ref 3.5–5.3)
Sodium: 132 mmol/L — ABNORMAL LOW (ref 135–146)
Total Bilirubin: 0.8 mg/dL (ref 0.2–1.2)
Total Protein: 6.1 g/dL (ref 6.1–8.1)

## 2020-01-19 LAB — VITAMIN D 25 HYDROXY (VIT D DEFICIENCY, FRACTURES): Vit D, 25-Hydroxy: 32 ng/mL (ref 30–100)

## 2020-01-19 NOTE — Progress Notes (Signed)
Office Visit Note  Patient: Megan Logan             Date of Birth: 09-Sep-1940           MRN: [id number]             PCP: Barbie Banner, MD Referring: Barbie Banner, MD Visit Date: 01/19/2020 Occupation: @GUAROCC @  Subjective:  Medication monitoring (last prolia injection- 12/31/2019)   History of Present Illness: Megan Logan is a 79 y.o. female with osteoarthritis and osteoporosis.  She has been taking Prolia injections every 6 months.  She states she has been tolerating the injections well.  She has been having some discomfort in her knee joints.  She also has neck discomfort.  She states she will be getting cortisone injection in her cervical region for ongoing discomfort.  She has a stiffness in her hands especially in the morning.  Activities of Daily Living:  Patient reports morning stiffness for a few  minutes.   Patient Reports nocturnal pain.  Difficulty dressing/grooming: Denies Difficulty climbing stairs: Denies Difficulty getting out of chair: Denies Difficulty using hands for taps, buttons, cutlery, and/or writing: Denies  Review of Systems  Constitutional: Positive for fatigue.  HENT: Positive for mouth dryness. Negative for mouth sores and nose dryness.   Eyes: Negative for itching and dryness.  Respiratory: Negative for shortness of breath and difficulty breathing.   Cardiovascular: Negative for chest pain and palpitations.  Gastrointestinal: Negative for blood in stool, constipation and diarrhea.  Endocrine: Negative for increased urination.  Genitourinary: Negative for difficulty urinating.  Musculoskeletal: Positive for arthralgias, joint pain, joint swelling and morning stiffness. Negative for myalgias, muscle tenderness and myalgias.  Skin: Negative for color change, rash and redness.  Allergic/Immunologic: Negative for susceptible to infections.  Neurological: Positive for headaches. Negative for dizziness, numbness, memory loss and weakness.    Hematological: Positive for bruising/bleeding tendency.  Psychiatric/Behavioral: Negative for confusion and sleep disturbance.    PMFS History:  Patient Active Problem List   Diagnosis Date Noted  . Volume depletion, gastrointestinal loss 12/04/2019  . Hyponatremia 12/03/2019  . Dizziness 12/03/2019  . Ketonuria 12/03/2019  . Metabolic acidosis 12/03/2019  . DDD (degenerative disc disease), cervical 12/15/2018  . Dyslipidemia 12/15/2018  . History of asthma 12/15/2018  . 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 Unspecified vitamin D deficiency 08/06/2013  . Age-related osteoporosis without current pathological fracture 08/04/2012  . 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 Anemia    diet related  . Asthma   . Atrophic vaginitis   . Colitis   . Hx of endometriosis    took Danacrine  . Hypercholesterolemia   . Osteoporosis    spine off Actonel sinsce 09/2010- Drug [facility]  . Premature ovarian failure age 62   HRT age 75 - 12/2000    Family History  Problem Relation Age of Onset  . Prostate cancer Father 31  . Alzheimer's disease Mother 42  . Other Sister        dizziness  . Cancer Sister 24       Brain and Lung  . Ulcerative colitis Sister   . Migraines Sister   . Cancer - Colon Maternal Grandfather 51  . Irritable bowel syndrome Daughter   . Colitis Daughter    Past Surgical History:  Procedure Laterality Date  . HYSTEROSCOPY  1996   DUB--wnl  . NASAL SINUS SURGERY  40's and 50's  .  TUBAL LIGATION Bilateral 1982   Social History   Social History Narrative  . Not on file   Immunization History  Administered Date(s) Administered  . Influenza,inj,Quad PF,6+ Mos 02/26/2014  . Moderna SARS-COVID-2 Vaccination 05/19/2019, 06/17/2019  . Pneumococcal Conjugate-13 05/29/2015  . Pneumococcal Polysaccharide-23 03/04/2013  . Tdap 09/29/2015  . Zoster 05/07/2007     Objective: Vital Signs: BP (!) 156/79 (BP  Location: Left Arm, Patient Position: Sitting, Cuff Size: Normal)   Pulse 67   Resp 13   Ht 5' 0.5" (1.537 m)   Wt 104 lb (47.2 kg)   LMP 05/07/1982 (Approximate)   BMI 19.98 kg/m    Physical Exam Vitals and nursing note reviewed.  Constitutional:      Appearance: She is well-developed.  HENT:     Head: Normocephalic and atraumatic.  Eyes:     Conjunctiva/sclera: Conjunctivae normal.  Cardiovascular:     Rate and Rhythm: Normal rate and regular rhythm.     Heart sounds: Normal heart sounds.  Pulmonary:     Effort: Pulmonary effort is normal.     Breath sounds: Normal breath sounds.  Abdominal:     General: Bowel sounds are normal.     Palpations: Abdomen is soft.  Musculoskeletal:     Cervical back: Normal range of motion.  Lymphadenopathy:     Cervical: No cervical adenopathy.  Skin:    General: Skin is warm and dry.     Capillary Refill: Capillary refill takes less than 2 seconds.  Neurological:     Mental Status: She is alert and oriented to person, place, and time.  Psychiatric:        Behavior: Behavior normal.      Musculoskeletal Exam: She has limited range of motion of her cervical spine.  Shoulder joints and elbow joints are in good range of motion.  She has bilateral CMC PIP and DIP thickening with no synovitis.  Hip joints and knee joints with good range of motion.  She discomfort range of motion of her right knee joint with no warmth swelling or effusion.  She has some DIP and PIP prominence but no synovitis was noted.  CDAI Exam: CDAI Score: -- Patient Global: --; Provider Global: -- Swollen: --; Tender: -- Joint Exam 01/19/2020   No joint exam has been documented for this visit   There is currently no information documented on the homunculus. Go to the Rheumatology activity and complete the homunculus joint exam.  Investigation: No additional findings.  Imaging: No results found.  Recent Labs: Lab Results  Component Value Date   WBC 5.7  12/23/2019   HGB 12.5 12/23/2019   PLT 310 12/23/2019   NA 134 (L) 12/23/2019   K 4.4 12/23/2019   CL 100 12/23/2019   CO2 30 12/23/2019   GLUCOSE 85 12/23/2019   BUN 13 12/23/2019   CREATININE 0.74 12/23/2019   BILITOT 0.8 12/23/2019   ALKPHOS 49 12/04/2019   AST 20 12/23/2019   ALT 15 12/23/2019   PROT 5.8 (L) 12/23/2019   ALBUMIN 3.3 (L) 12/04/2019   CALCIUM 9.4 12/23/2019   GFRAA 91 12/23/2019    Speciality Comments: No specialty comments available.  Procedures:  No procedures performed Allergies: Cephalexin, Erythromycin base, Hydrocodone-homatropine, Levofloxacin, Penicillins, Prednisone, Sulfamethoxazole-trimethoprim, and Augmentin [amoxicillin-pot clavulanate]   Assessment / Plan:     Visit Diagnoses: Age-related osteoporosis without current pathological fracture - DEXA on 10/09/2017 showed T-score of -2.8 with -4.8% change in BMD.  Advised her to get repeat DEXA  scan.  She will contact her GYN.  Prolia: 12/31/2019  Vitamin D deficiency -she has history of vitamin D deficiency.  Will check vitamin D level today.  She has been taking supplement.  Plan: VITAMIN D 25 Hydroxy (Vit-D Deficiency, Fractures)  Primary osteoarthritis of both hands-she has severe DIP and PIP prominence.  Joint protection muscle strengthening was discussed.  Primary osteoarthritis of right knee - x-ray showed moderate osteoarthritis and severe chondromalacia patella.  She has been experiencing some discomfort in her right knee joint.  Muscle strengthening exercises were discussed.  If she has persistent pain we can consider cortisone injection in the future.  DDD (degenerative disc disease), cervical - She goes to Dr. Ethelene Hal.  She has an appointment coming up with Dr. Horald Chestnut for injection.  History of asthma  Postmenopausal atrophic vaginitis  Dyslipidemia  History of gastroesophageal reflux (GERD)  Premature ovarian failure  Educated about COVID-19 virus infection  Medication monitoring  encounter - Plan: CBC with Differential/Platelet, COMPLETE METABOLIC PANEL WITH GFR  Orders: Orders Placed This Encounter  Procedures  . CBC with Differential/Platelet  . COMPLETE METABOLIC PANEL WITH GFR  . VITAMIN D 25 Hydroxy (Vit-D Deficiency, Fractures)   No orders of the defined types were placed in this encounter.    Follow-Up Instructions: Return in about 6 months (around 07/18/2020) for Osteoporosis, Osteoarthritis.   Pollyann Savoy, MD  Note - This record has been created using Animal nutritionist.  Chart creation errors have been sought, but may not always  have been located. Such creation errors do not reflect on  the standard of medical care.

## 2020-01-20 NOTE — Progress Notes (Signed)
CBC, CMP and vitamin D are within normal limits.

## 2020-01-28 ENCOUNTER — Ambulatory Visit (HOSPITAL_COMMUNITY): Payer: Medicare PPO

## 2020-02-03 ENCOUNTER — Other Ambulatory Visit: Payer: Self-pay

## 2020-02-03 ENCOUNTER — Ambulatory Visit (HOSPITAL_COMMUNITY)
Admission: RE | Admit: 2020-02-03 | Discharge: 2020-02-03 | Disposition: A | Discharge status: Home / Self Care | Payer: Medicare PPO | Source: Ambulatory Visit | Attending: Obstetrics and Gynecology | Admitting: Obstetrics and Gynecology

## 2020-02-03 DIAGNOSIS — Z1231 Encounter for screening mammogram for malignant neoplasm of breast: Secondary | ICD-10-CM | POA: Diagnosis present

## 2020-02-29 DIAGNOSIS — I10 Essential (primary) hypertension: Secondary | ICD-10-CM | POA: Insufficient documentation

## 2020-04-07 ENCOUNTER — Other Ambulatory Visit: Payer: Self-pay

## 2020-04-07 ENCOUNTER — Ambulatory Visit (INDEPENDENT_AMBULATORY_CARE_PROVIDER_SITE_OTHER): Payer: Medicare PPO | Admitting: Obstetrics and Gynecology

## 2020-04-07 ENCOUNTER — Encounter: Payer: Self-pay | Admitting: Obstetrics and Gynecology

## 2020-04-07 VITALS — BP 122/64 | HR 59 | Ht 60.0 in | Wt 102.0 lb

## 2020-04-07 DIAGNOSIS — M810 Age-related osteoporosis without current pathological fracture: Secondary | ICD-10-CM | POA: Diagnosis not present

## 2020-04-07 DIAGNOSIS — Z01419 Encounter for gynecological examination (general) (routine) without abnormal findings: Secondary | ICD-10-CM | POA: Diagnosis not present

## 2020-04-07 NOTE — Progress Notes (Signed)
79 y.o. G40P1001 Married White or Caucasian Not Hispanic or Latino female here for annual exam. No vaginal bleeding. Intermittent constipation, no change. No bladder c/o.  Not sexually active. Husband has medical problems.   She has been having trouble with low sodium. She has also developed hight blood pressure. Dr. Andrey Campanile is following.   She just doesn't feel well, exhausted, no energy. She is wiped out with minimal exertion.   Osteoporosis, getting prolia from her Rheumatologist, but needs me to order her DEXA.     Patient's last menstrual period was 05/07/1982 (approximate).          Sexually active: No.  The current method of family planning is post menopausal status.    Exercising: No.  The patient does not participate in regular exercise at present. Smoker:  no  Health Maintenance: Pap:  07/2009 neg  History of abnormal Pap:  no MMG:  01/25/20 density C Bi-rads 1 neg  BMD:   10/09/2017 Osteoporosis, on actonel Colonoscopy: 2020 WNL TDaP:  09/29/15  Gardasil: no    reports that she has never smoked. She has never used smokeless tobacco. She reports that she does not drink alcohol and does not use drugs. 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 She has daughter, lives in [place]. No grandchildren  Past Medical History:  Diagnosis Date  . Anemia    diet related  . Asthma   . Atrophic vaginitis   . Colitis   . Hx of endometriosis    took Danacrine  . Hypercholesterolemia   . Osteoporosis    spine off Actonel sinsce 09/2010- Drug [facility]  . Premature ovarian failure age 14   HRT age 81 - 12/2000    Past Surgical History:  Procedure Laterality Date  . HYSTEROSCOPY  1996   DUB--wnl  . NASAL SINUS SURGERY  40's and 50's  . TUBAL LIGATION Bilateral 1982    Current Outpatient Medications  Medication Sig Dispense Refill  . acetaminophen (TYLENOL) 325 MG tablet Take 650 mg by mouth every 6 (six) hours as needed for mild pain or headache.    Marland Kitchen azelastine (ASTELIN) 0.1 % nasal  spray Place 1 spray into both nostrils 2 (two) times daily. Use in each nostril as directed    . BREO ELLIPTA 200-25 MCG/INH AEPB Inhale 1 puff into the lungs daily.  1  . calcium carbonate (OS-CAL) 600 MG TABS Take 600 mg by mouth 2 (two) times daily with a meal.    . cholecalciferol (VITAMIN D) 1000 units tablet Take 2 capsules by mouth daily.     Marland Kitchen denosumab (PROLIA) 60 MG/ML SOSY injection Inject 60 mg into the skin every 6 (six) months. 1 mL 0  . fluticasone (FLONASE) 50 MCG/ACT nasal spray Place into both nostrils daily.    Marland Kitchen gabapentin (NEURONTIN) 300 MG capsule Take 300 mg by mouth at bedtime.     Marland Kitchen losartan (COZAAR) 25 MG tablet Take 1 tablet daily for blood pressure control.    . ondansetron (ZOFRAN-ODT) 4 MG disintegrating tablet Take 4 mg by mouth every 8 (eight) hours as needed for nausea/vomiting.    . pramipexole (MIRAPEX) 1 MG tablet Take 1 tablet by mouth at bedtime as needed.   5  . pseudoephedrine (SUDAFED) 120 MG 12 hr tablet Take 120 mg by mouth every 12 (twelve) hours as needed for congestion.    . simvastatin (ZOCOR) 20 MG tablet Take 20 mg by mouth daily at 6 PM.   3  . sodium chloride (OCEAN) 0.65 %  SOLN nasal spray Place 2 sprays into both nostrils every 4 (four) hours. 30 mL 0   No current facility-administered medications for this visit.    Family History  Problem Relation Age of Onset  . Prostate cancer Father 10  . Alzheimer's disease Mother 56  . Other Sister        dizziness  . Cancer Sister 28       Brain and Lung  . Ulcerative colitis Sister   . Migraines Sister   . Cancer - Colon Maternal Grandfather 51  . Irritable bowel syndrome Daughter   . Colitis Daughter     Review of Systems  All other systems reviewed and are negative.   Exam:   BP 122/64   Pulse (!) 59   Ht 5' (1.524 m)   Wt 102 lb (46.3 kg)   LMP 05/07/1982 (Approximate)   SpO2 95%   BMI 19.92 kg/m   Weight change: @WEIGHTCHANGE @ Height:   Height: 5' (152.4 cm)  Ht Readings  from Last 3 Encounters:  04/07/20 5' (1.524 m)  01/19/20 5' 0.5" (1.537 m)  12/03/19 5\' 1"  (1.549 m)    General appearance: alert, cooperative and appears stated age Head: Normocephalic, without obvious abnormality, atraumatic Neck: no adenopathy, supple, symmetrical, trachea midline and thyroid normal to inspection and palpation Lungs: clear to auscultation bilaterally Cardiovascular: regular rate and rhythm Breasts: normal appearance, no masses or tenderness Abdomen: soft, non-tender; non distended,  no masses,  no organomegaly Extremities: extremities normal, atraumatic, no cyanosis or edema Skin: Skin color, texture, turgor normal. No rashes or lesions Lymph nodes: Cervical, supraclavicular, and axillary nodes normal. No abnormal inguinal nodes palpated Neurologic: Grossly normal   Pelvic: External genitalia:  no lesions              Urethra:  normal appearing urethra with no masses, tenderness or lesions              Bartholins and Skenes: normal                 Vagina: atrophic appearing vagina with normal color and discharge, no lesions              Cervix: no lesions               Bimanual Exam:  Uterus:  normal size, contour, position, consistency, mobility, non-tender              Adnexa: no mass, fullness, tenderness               Rectovaginal: Confirms               Anus:  normal sphincter tone, no lesions  12/05/19 chaperoned for the exam.  A:  Well Woman with normal exam  H/O osteoporosis, DEXA overdue.   Not feeling well, fatigue, low sodium. She will f/u with primary  P:   No pap needed   Discussed calcium 1200 mg a day  Continue with vit d, 1,000 IU a day  Discussed breast self exam  Discussed calcium and vit D intake  DEXA ordered

## 2020-04-07 NOTE — Patient Instructions (Addendum)
EXERCISE   We recommended that you start or continue a regular exercise program for good health. Physical activity is anything that gets your body moving, some is better than none. The CDC recommends 150 minutes per week of Moderate-Intensity Aerobic Activity and 2 or more days of Muscle Strengthening Activity.  Benefits of exercise are limitless: helps weight loss/weight maintenance, improves mood and energy, helps with depression and anxiety, improves sleep, tones and strengthens muscles, improves balance, improves bone density, protects from chronic conditions such as heart disease, high blood pressure and diabetes and so much more. To learn more visit: https://www.cdc.gov/physicalactivity/index.html  DIET: Good nutrition starts with a healthy diet of fruits, vegetables, whole grains, and lean protein sources. Drink plenty of water for hydration. Minimize empty calories, sodium, sweets. For more information about dietary recommendations visit: https://health.gov/our-work/nutrition-physical-activity/dietary-guidelines and https://www.myplate.gov/  ALCOHOL:  Women should limit their alcohol intake to no more than 7 drinks/beers/glasses of wine (combined, not each!) per week. Moderation of alcohol intake to this level decreases your risk of breast cancer and liver damage.  If you are concerned that you may have a problem, or your friends have told you they are concerned about your drinking, there are many resources to help. A well-known program that is free, effective, and available to all people all over the nation is Alcoholics Anonymous.  Check out this site to learn more: https://www.aa.org/   CALCIUM AND VITAMIN D:  Adequate intake of calcium and Vitamin D are recommended for bone health.  The recommendations for exact amounts of these supplements seem to change often, but generally speaking 1000-1500 mg of calcium (between diet and supplement) and 800 units of Vitamin D per day seems prudent.      PAP SMEARS:  Pap smears, to check for cervical cancer or precancers,  have traditionally been done yearly, although recent scientific advances have shown that most women can have pap smears less often.  However, every woman still should have a physical exam from her gynecologist every year. It will include a breast check, inspection of the vulva and vagina to check for abnormal growths or skin changes, a visual exam of the cervix, and then an exam to evaluate the size and shape of the uterus and ovaries.  And after 77 years of age, a rectal exam is indicated to check for rectal cancers. We will also provide age appropriate advice regarding health maintenance, like when you should have certain vaccines, screening for sexually transmitted diseases, bone density testing, colonoscopy, mammograms, etc.   MAMMOGRAMS:  All women over 40 years old should have a routine mammogram.   COLON CANCER SCREENING: Now recommend starting at age 45. At this time colonoscopy is not covered for routine screening until 50. There are take home tests that can be done between 45-49.   COLONOSCOPY:  Colonoscopy to screen for colon cancer is recommended for all women at age 50.  We know, you hate the idea of the prep.  We agree, BUT, having colon cancer and not knowing it is worse!!  Colon cancer so often starts as a polyp that can be seen and removed at colonscopy, which can quite literally save your life!  And if your first colonoscopy is normal and you have no family history of colon cancer, most women don't have to have it again for 10 years.  Once every ten years, you can do something that may end up saving your life, right?  We will be happy to help you get it scheduled when   you are ready.  Be sure to check your insurance coverage so you understand how much it will cost.  It may be covered as a preventative service at no cost, but you should check your particular policy.      Breast Self-Awareness Breast self-awareness  means being familiar with how your breasts look and feel. It involves checking your breasts regularly and reporting any changes to your health care provider. Practicing breast self-awareness is important. A change in your breasts can be a sign of a serious medical problem. Being familiar with how your breasts look and feel allows you to find any problems early, when treatment is more likely to be successful. All women should practice breast self-awareness, including women who have had breast implants. How to do a breast self-exam One way to learn what is normal for your breasts and whether your breasts are changing is to do a breast self-exam. To do a breast self-exam: Look for Changes  1. Remove all the clothing above your waist. 2. Stand in front of a mirror in a room with good lighting. 3. Put your hands on your hips. 4. Push your hands firmly downward. 5. Compare your breasts in the mirror. Look for differences between them (asymmetry), such as: ? Differences in shape. ? Differences in size. ? Puckers, dips, and bumps in one breast and not the other. 6. Look at each breast for changes in your skin, such as: ? Redness. ? Scaly areas. 7. Look for changes in your nipples, such as: ? Discharge. ? Bleeding. ? Dimpling. ? Redness. ? A change in position. Feel for Changes Carefully feel your breasts for lumps and changes. It is best to do this while lying on your back on the floor and again while sitting or standing in the shower or tub with soapy water on your skin. Feel each breast in the following way:  Place the arm on the side of the breast you are examining above your head.  Feel your breast with the other hand.  Start in the nipple area and make  inch (2 cm) overlapping circles to feel your breast. Use the pads of your three middle fingers to do this. Apply light pressure, then medium pressure, then firm pressure. The light pressure will allow you to feel the tissue closest to the  skin. The medium pressure will allow you to feel the tissue that is a little deeper. The firm pressure will allow you to feel the tissue close to the ribs.  Continue the overlapping circles, moving downward over the breast until you feel your ribs below your breast.  Move one finger-width toward the center of the body. Continue to use the  inch (2 cm) overlapping circles to feel your breast as you move slowly up toward your collarbone.  Continue the up and down exam using all three pressures until you reach your armpit.  Write Down What You Find  Write down what is normal for each breast and any changes that you find. Keep a written record with breast changes or normal findings for each breast. By writing this information down, you do not need to depend only on memory for size, tenderness, or location. Write down where you are in your menstrual cycle, if you are still menstruating. If you are having trouble noticing differences in your breasts, do not get discouraged. With time you will become more familiar with the variations in your breasts and more comfortable with the exam. How often should I examine   my breasts? Examine your breasts every month. If you are breastfeeding, the best time to examine your breasts is after a feeding or after using a breast pump. If you menstruate, the best time to examine your breasts is 5-7 days after your period is over. During your period, your breasts are lumpier, and it may be more difficult to notice changes. When should I see my health care provider? See your health care provider if you notice:  A change in shape or size of your breasts or nipples.  A change in the skin of your breast or nipples, such as a reddened or scaly area.  Unusual discharge from your nipples.  A lump or thick area that was not there before.  Pain in your breasts.  Anything that concerns you.   Osteoporosis  Osteoporosis is thinning and loss of density in your bones.  Osteoporosis makes bones more brittle and fragile and more likely to break (fracture). Over time, osteoporosis can cause your bones to become so weak that they fracture after a minor fall. Bones in the hip, wrist, and spine are most likely to fracture due to osteoporosis. What are the causes? The exact cause of this condition is not known. What increases the risk? You may be at greater risk for osteoporosis if you:  Have a family history of the condition.  Have poor nutrition.  Use steroid medicines, such as prednisone.  Are female.  Are age 54 or older.  Smoke or have a history of smoking.  Are not physically active (are sedentary).  Are white (Caucasian) or of Asian descent.  Have a small body frame.  Take certain medicines, such as antiseizure medicines. What are the signs or symptoms? A fracture might be the first sign of osteoporosis, especially if the fracture results from a fall or injury that usually would not cause a bone to break. Other signs and symptoms include:  Pain in the neck or low back.  Stooped posture.  Loss of height. How is this diagnosed? This condition may be diagnosed based on:  Your medical history.  A physical exam.  A bone mineral density test, also called a DXA or DEXA test (dual-energy X-ray absorptiometry test). This test uses X-rays to measure the amount of minerals in your bones. How is this treated? The goal of treatment is to strengthen your bones and lower your risk for a fracture. Treatment may involve:  Making lifestyle changes, such as: ? Including foods with more calcium and vitamin D in your diet. ? Doing weight-bearing and muscle-strengthening exercises. ? Stopping tobacco use. ? Limiting alcohol intake.  Taking medicine to slow the process of bone loss or to increase bone density.  Taking daily supplements of calcium and vitamin D.  Taking hormone replacement medicines, such as estrogen for women and testosterone for  men.  Monitoring your levels of calcium and vitamin D. Follow these instructions at home:  Activity  Exercise as told by your health care provider. Ask your health care provider what exercises and activities are safe for you. You should do: ? Exercises that make you work against gravity (weight-bearing exercises), such as tai chi, yoga, or walking. ? Exercises to strengthen muscles, such as lifting weights. Lifestyle  Limit alcohol intake to no more than 1 drink a day for nonpregnant women and 2 drinks a day for men. One drink equals 12 oz of beer, 5 oz of wine, or 1 oz of hard liquor.  Do not use any products that contain  nicotine or tobacco, such as cigarettes and e-cigarettes. If you need help quitting, ask your health care provider. Preventing falls  Use devices to help you move around (mobility aids) as needed, such as canes, walkers, scooters, or crutches.  Keep rooms well-lit and clutter-free.  Remove tripping hazards from walkways, including cords and throw rugs.  Install grab bars in bathrooms and safety rails on stairs.  Use rubber mats in the bathroom and other areas that are often wet or slippery.  Wear closed-toe shoes that fit well and support your feet. Wear shoes that have rubber soles or low heels.  Review your medicines with your health care provider. Some medicines can cause dizziness or changes in blood pressure, which can increase your risk of falling. General instructions  Include calcium and vitamin D in your diet. Calcium is important for bone health, and vitamin D helps your body to absorb calcium. Good sources of calcium and vitamin D include: ? Certain fatty fish, such as salmon and tuna. ? Products that have calcium and vitamin D added to them (fortified products), such as fortified cereals. ? Egg yolks. ? Cheese. ? Liver.  Take over-the-counter and prescription medicines only as told by your health care provider.  Keep all follow-up visits as  told by your health care provider. This is important. Contact a health care provider if:  You have never been screened for osteoporosis and you are: ? A woman who is age 41 or older. ? A man who is age 25 or older. Get help right away if:  You fall or injure yourself. Summary  Osteoporosis is thinning and loss of density in your bones. This makes bones more brittle and fragile and more likely to break (fracture),even with minor falls.  The goal of treatment is to strengthen your bones and reduce your risk for a fracture.  Include calcium and vitamin D in your diet. Calcium is important for bone health, and vitamin D helps your body to absorb calcium.  Talk with your health care provider about screening for osteoporosis if you are a woman who is age 67 or older, or a man who is age 62 or older. This information is not intended to replace advice given to you by your health care provider. Make sure you discuss any questions you have with your health care provider. Document Revised: 04/05/2017 Document Reviewed: 02/15/2017 Elsevier Patient Education  2020 [employer].

## 2020-05-04 ENCOUNTER — Ambulatory Visit (HOSPITAL_COMMUNITY)
Admission: RE | Admit: 2020-05-04 | Discharge: 2020-05-04 | Disposition: A | Discharge status: Home / Self Care | Payer: Medicare PPO | Source: Ambulatory Visit | Attending: Obstetrics and Gynecology | Admitting: Obstetrics and Gynecology

## 2020-05-04 ENCOUNTER — Other Ambulatory Visit: Payer: Self-pay

## 2020-05-04 DIAGNOSIS — M810 Age-related osteoporosis without current pathological fracture: Secondary | ICD-10-CM | POA: Diagnosis present

## 2020-05-09 NOTE — Progress Notes (Signed)
DEXA shows that the bone density is a stable.  No change in treatment advised.

## 2020-06-21 ENCOUNTER — Other Ambulatory Visit: Payer: Self-pay | Admitting: Rheumatology

## 2020-06-21 ENCOUNTER — Other Ambulatory Visit: Payer: Self-pay | Admitting: Pharmacist

## 2020-06-21 DIAGNOSIS — M810 Age-related osteoporosis without current pathological fracture: Secondary | ICD-10-CM

## 2020-06-21 NOTE — Telephone Encounter (Signed)
Last Visit: 01/19/2020 Next Visit: 07/18/2020 Labs: 01/19/2020, CBC, CMP and vitamin D are within normal limits.  Current Dose per office note 01/19/2020, not discussed DX: Age-related osteoporosis without current pathological fracture   Last Fill: 12/24/2019  Okay to refill Prolia?

## 2020-06-21 NOTE — Telephone Encounter (Signed)
Routing refill request

## 2020-07-04 NOTE — Progress Notes (Unsigned)
Office Visit Note  Patient: Megan Logan             Date of Birth: 1940/09/22           MRN: [id number]             PCP: Barbie Banner, MD Referring: Barbie Banner, MD Visit Date: 07/18/2020 Occupation: @GUAROCC @  Subjective:  Other (Bilateral hand pain and neck pain )   History of Present Illness: Megan Logan is a 80 y.o. female with a history of osteoarthritis, degenerative disc disease and osteoporosis.  She came here to get her Prolia injection.  She also had a bone density in December 2021 which showed stability in her BMD.  She continues to have some discomfort in her cervical region.  She has a stiffness in her hands in the morning.  She had a cortisone injection to her right knee joint on July 20, 2019 which helped.  She states she was recently seen by endocrinologist who diagnosed her with adrenal insufficiency and she is on hydrocortisone now.  Activities of Daily Living:  Patient reports morning stiffness for 1 hour.   Patient Reports nocturnal pain.  Difficulty dressing/grooming: Denies Difficulty climbing stairs: Denies Difficulty getting out of chair: Denies Difficulty using hands for taps, buttons, cutlery, and/or writing: Denies  Review of Systems  Constitutional: Positive for fatigue.  HENT: Positive for mouth dryness. Negative for mouth sores and nose dryness.   Eyes: Negative for pain, itching and dryness.  Respiratory: Positive for shortness of breath. Negative for difficulty breathing.   Cardiovascular: Negative for chest pain and palpitations.  Gastrointestinal: Negative for blood in stool, constipation and diarrhea.  Endocrine: Negative for increased urination.  Genitourinary: Negative for difficulty urinating.  Musculoskeletal: Positive for arthralgias, joint pain, joint swelling and morning stiffness. Negative for myalgias, muscle tenderness and myalgias.  Skin: Negative for color change, rash and redness.  Allergic/Immunologic: Negative for  susceptible to infections.  Neurological: Positive for dizziness and headaches. Negative for numbness, memory loss and weakness.  Hematological: Positive for bruising/bleeding tendency.  Psychiatric/Behavioral: Negative for confusion.    PMFS History:  Patient Active Problem List   Diagnosis Date Noted  . Essential hypertension 02/29/2020  . 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 Volume depletion, gastrointestinal loss 12/04/2019  . Hyponatremia 12/03/2019  . Dizziness 12/03/2019  . Ketonuria 12/03/2019  . Metabolic acidosis 12/03/2019  . Chronic rhinitis 02/19/2019  . Primary osteoarthritis involving multiple joints 02/19/2019  . DDD (degenerative disc disease), cervical 12/15/2018  . Dyslipidemia 12/15/2018  . History of asthma 12/15/2018  . Elevated hemoglobin A1c 11/14/2017  . Chronic neck pain 09/19/2016  . Asthma 07/07/2015  . Eczema 07/07/2015  . Gastroesophageal reflux disease without esophagitis 07/07/2015  . Restless legs syndrome 07/07/2015  . 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 Unspecified vitamin D deficiency 08/06/2013  . Age-related osteoporosis without current pathological fracture 08/04/2012  . 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  . Osteoporosis 08/04/2012    Past Medical History:  Diagnosis Date  . Anemia    diet related  . Asthma   . Atrophic vaginitis   . Colitis   . Hx of endometriosis    took Danacrine  . Hypercholesterolemia   . Osteoporosis    spine off Actonel sinsce 09/2010- Drug [facility]  . Premature ovarian failure age 61   HRT age 74 - 12/2000    Family History  Problem Relation Age of Onset  . Prostate cancer Father 62  .  Alzheimer's disease Mother 61  . Other Sister        dizziness  . Cancer Sister 17       Brain and Lung  . Ulcerative colitis Sister   . Migraines Sister   . Cancer - Colon Maternal Grandfather 51  . Irritable bowel syndrome Daughter   . Colitis Daughter    Past Surgical  History:  Procedure Laterality Date  . HYSTEROSCOPY  1996   DUB--wnl  . NASAL SINUS SURGERY  40's and 50's  . TUBAL LIGATION Bilateral 1982   Social History   Social History Narrative  . Not on file   Immunization History  Administered Date(s) Administered  . Fluad Quad(high Dose 65+) 01/15/2019  . Influenza Split 02/24/2017  . Influenza, High Dose Seasonal PF 02/26/2014, 02/24/2017, 02/24/2018, 01/15/2019, 02/11/2020  . Influenza,inj,Quad PF,6+ Mos 02/26/2014  . Influenza,inj,quad, With Preservative 02/04/2017  . Moderna Sars-Covid-2 Vaccination 05/19/2019, 06/17/2019, 02/29/2020  . Pneumococcal Conjugate-13 05/29/2015  . Pneumococcal Polysaccharide-23 03/04/2013  . Tdap 09/29/2015  . Zoster 05/07/2007, 08/26/2017, 11/10/2017  . Zoster Recombinat (Shingrix) 08/26/2017, 11/10/2017     Objective: Vital Signs: BP (!) 150/79 (BP Location: Left Arm, Patient Position: Sitting, Cuff Size: Normal)   Pulse 65   Resp 12   Ht 5\' 1"  (1.549 m)   Wt 108 lb 6.4 oz (49.2 kg)   LMP 05/07/1982 (Approximate)   BMI 20.48 kg/m    Physical Exam Vitals and nursing note reviewed.  Constitutional:      Appearance: She is well-developed.  HENT:     Head: Normocephalic and atraumatic.  Eyes:     Conjunctiva/sclera: Conjunctivae normal.  Cardiovascular:     Rate and Rhythm: Normal rate and regular rhythm.     Heart sounds: Normal heart sounds.  Pulmonary:     Effort: Pulmonary effort is normal.     Breath sounds: Normal breath sounds.  Abdominal:     General: Bowel sounds are normal.     Palpations: Abdomen is soft.  Musculoskeletal:     Cervical back: Normal range of motion.  Lymphadenopathy:     Cervical: No cervical adenopathy.  Skin:    General: Skin is warm and dry.     Capillary Refill: Capillary refill takes less than 2 seconds.  Neurological:     Mental Status: She is alert and oriented to person, place, and time.  Psychiatric:        Behavior: Behavior normal.       Musculoskeletal Exam: She had limited range of motion of the cervical spine.  Shoulder joints, elbow joints, wrist joints with good range of motion.  She has bilateral PIP and DIP thickening.  Hip joints and knee joints with good range of motion.  She has no tenderness over ankles or MTPs.  There was no point tenderness over the thoracic or lumbar spine  CDAI Exam: CDAI Score: - Patient Global: -; Provider Global: - Swollen: -; Tender: - Joint Exam 07/18/2020   No joint exam has been documented for this visit   There is currently no information documented on the homunculus. Go to the Rheumatology activity and complete the homunculus joint exam.  Investigation: No additional findings.  Imaging: No results found.  Recent Labs: Lab Results  Component Value Date   WBC 9.4 07/14/2020   HGB 12.8 07/14/2020   PLT 292 07/14/2020   NA 139 07/14/2020   K 4.4 07/14/2020   CL 103 07/14/2020   CO2 29 07/14/2020   GLUCOSE 94 07/14/2020  BUN 20 07/14/2020   CREATININE 0.76 07/14/2020   BILITOT 0.6 07/14/2020   ALKPHOS 49 12/04/2019   AST 19 07/14/2020   ALT 16 07/14/2020   PROT 5.6 (L) 07/14/2020   ALBUMIN 3.3 (L) 12/04/2019   CALCIUM 9.5 07/14/2020   GFRAA 87 07/14/2020    Speciality Comments: No specialty comments available.  Procedures:  No procedures performed Allergies: Cephalexin, Erythromycin base, Hydrocodone-homatropine, Levofloxacin, Penicillins, Prednisone, Sulfamethoxazole-trimethoprim, and Augmentin [amoxicillin-pot clavulanate]   Assessment / Plan:     Visit Diagnoses: Age-related osteoporosis without current pathological fracture - DXA 12/2021AP Spine L1-L4 (L2,L3) 05/04/2020 77.9 Osteoporosis -2.9 0.820 g/cm20.6% -  Prolia: 12/31/2019 -her last bone density showed stable BMD.  She will continue with Prolia injections.  Plan: denosumab (PROLIA) injection 60 mg today.  Patient received the injection without any problems.  Vitamin D deficiency-her last vitamin D  level on July 14, 2020 was 38.  She has been advised to continue to take calcium and vitamin D.  Resistive exercises were also emphasized.  Primary osteoarthritis of both hands-joint protection muscle strengthening was discussed.  Primary osteoarthritis of right knee - x-ray showed moderate osteoarthritis and severe chondromalacia patella.  She has not had any recurrence of knee joint discomfort.  If she has ongoing problems we can consider Visco supplement injections in the future.  DDD (degenerative disc disease), cervical - She goes to Dr. Ethelene Hal.  She is to get frequent cortisone injections.  Adrenal insufficiency-she was evaluated by endocrinologist recently and was diagnosed with adrenal insufficiency.  She is on hydrocortisone now.  History of asthma  Postmenopausal atrophic vaginitis  History of gastroesophageal reflux (GERD)  Dyslipidemia  Premature ovarian failure  Orders: No orders of the defined types were placed in this encounter.  Meds ordered this encounter  Medications  . denosumab (PROLIA) injection 60 mg     Follow-Up Instructions: Return in about 6 months (around 01/18/2021) for Osteoporosis.   Pollyann Savoy, MD  Note - This record has been created using Animal nutritionist.  Chart creation errors have been sought, but may not always  have been located. Such creation errors do not reflect on  the standard of medical care.

## 2020-07-12 ENCOUNTER — Telehealth: Payer: Self-pay | Admitting: Pharmacist

## 2020-07-12 MED FILL — PROLIA 60 MG/ML SOLN: 60 | 180 days supply | Qty: 1 | Fill #0

## 2020-07-12 NOTE — Telephone Encounter (Signed)
Shipment set up for delivery to clinic.

## 2020-07-12 NOTE — Telephone Encounter (Signed)
Patient overdue for Prolia. She has upcoming appointment with Dr. Corliss Skains on 07/18/20.  She will plan to come get labs drawn this Thursday, 07/13/20, so that Prolia can be administered at clinic visit.  Will route to Rachael to schedule shipment to clinic and collect copay for [facility] to bill  Chesley Mires, PharmD, MPH Clinical Pharmacist (Rheumatology and Pulmonology)

## 2020-07-14 ENCOUNTER — Other Ambulatory Visit: Payer: Self-pay | Admitting: *Deleted

## 2020-07-14 DIAGNOSIS — M810 Age-related osteoporosis without current pathological fracture: Secondary | ICD-10-CM

## 2020-07-14 DIAGNOSIS — M19041 Primary osteoarthritis, right hand: Secondary | ICD-10-CM

## 2020-07-14 DIAGNOSIS — M19042 Primary osteoarthritis, left hand: Secondary | ICD-10-CM

## 2020-07-14 DIAGNOSIS — E559 Vitamin D deficiency, unspecified: Secondary | ICD-10-CM

## 2020-07-14 DIAGNOSIS — R5383 Other fatigue: Secondary | ICD-10-CM

## 2020-07-15 LAB — CBC WITH DIFFERENTIAL/PLATELET
Absolute Monocytes: 714 cells/uL (ref 200–950)
Basophils Absolute: 47 cells/uL (ref 0–200)
Basophils Relative: 0.5 %
Eosinophils Absolute: 19 cells/uL (ref 15–500)
Eosinophils Relative: 0.2 %
HCT: 38.8 % (ref 35.0–45.0)
Hemoglobin: 12.8 g/dL (ref 11.7–15.5)
Lymphs Abs: 893 cells/uL (ref 850–3900)
MCH: 31.3 pg (ref 27.0–33.0)
MCHC: 33 g/dL (ref 32.0–36.0)
MCV: 94.9 fL (ref 80.0–100.0)
MPV: 10.4 fL (ref 7.5–12.5)
Monocytes Relative: 7.6 %
Neutro Abs: 7727 cells/uL (ref 1500–7800)
Neutrophils Relative %: 82.2 %
Platelets: 292 10*3/uL (ref 140–400)
RBC: 4.09 10*6/uL (ref 3.80–5.10)
RDW: 12.4 % (ref 11.0–15.0)
Total Lymphocyte: 9.5 %
WBC: 9.4 10*3/uL (ref 3.8–10.8)

## 2020-07-15 LAB — COMPLETE METABOLIC PANEL WITH GFR
AG Ratio: 2.7 (calc) — ABNORMAL HIGH (ref 1.0–2.5)
ALT: 16 U/L (ref 6–29)
AST: 19 U/L (ref 10–35)
Albumin: 4.1 g/dL (ref 3.6–5.1)
Alkaline phosphatase (APISO): 48 U/L (ref 37–153)
BUN: 20 mg/dL (ref 7–25)
CO2: 29 mmol/L (ref 20–32)
Calcium: 9.5 mg/dL (ref 8.6–10.4)
Chloride: 103 mmol/L (ref 98–110)
Creat: 0.76 mg/dL (ref 0.60–0.93)
GFR, Est African American: 87 mL/min/{1.73_m2} (ref >=60)
GFR, Est Non African American: 75 mL/min/{1.73_m2} (ref >=60)
Globulin: 1.5 g/dL (calc) — ABNORMAL LOW (ref 1.9–3.7)
Glucose, Bld: 94 mg/dL (ref 65–99)
Potassium: 4.4 mmol/L (ref 3.5–5.3)
Sodium: 139 mmol/L (ref 135–146)
Total Bilirubin: 0.6 mg/dL (ref 0.2–1.2)
Total Protein: 5.6 g/dL — ABNORMAL LOW (ref 6.1–8.1)

## 2020-07-15 LAB — VITAMIN D 25 HYDROXY (VIT D DEFICIENCY, FRACTURES): Vit D, 25-Hydroxy: 38 ng/mL (ref 30–100)

## 2020-07-15 NOTE — Telephone Encounter (Signed)
Patient has labs drawn yesterday 07/14/20 for Prolia.  Ok to proceed with Prolia at [facility] on 07/18/20 with Dr. Corliss Skains. Medication already delivered from [facility] and is in clinic fridge.

## 2020-07-15 NOTE — Progress Notes (Signed)
CBC  is normal, CMP is a stable, vitamin D is normal.

## 2020-07-18 ENCOUNTER — Encounter: Payer: Self-pay | Admitting: Rheumatology

## 2020-07-18 ENCOUNTER — Other Ambulatory Visit: Payer: Self-pay

## 2020-07-18 ENCOUNTER — Ambulatory Visit: Payer: Medicare PPO | Admitting: Rheumatology

## 2020-07-18 VITALS — BP 150/79 | HR 65 | Resp 12 | Ht 61.0 in | Wt 108.4 lb

## 2020-07-18 DIAGNOSIS — M810 Age-related osteoporosis without current pathological fracture: Secondary | ICD-10-CM

## 2020-07-18 DIAGNOSIS — M19042 Primary osteoarthritis, left hand: Secondary | ICD-10-CM

## 2020-07-18 DIAGNOSIS — M1711 Unilateral primary osteoarthritis, right knee: Secondary | ICD-10-CM | POA: Diagnosis not present

## 2020-07-18 DIAGNOSIS — E785 Hyperlipidemia, unspecified: Secondary | ICD-10-CM

## 2020-07-18 DIAGNOSIS — E559 Vitamin D deficiency, unspecified: Secondary | ICD-10-CM | POA: Diagnosis not present

## 2020-07-18 DIAGNOSIS — E2740 Unspecified adrenocortical insufficiency: Secondary | ICD-10-CM

## 2020-07-18 DIAGNOSIS — M19041 Primary osteoarthritis, right hand: Secondary | ICD-10-CM | POA: Diagnosis not present

## 2020-07-18 DIAGNOSIS — Z8719 Personal history of other diseases of the digestive system: Secondary | ICD-10-CM

## 2020-07-18 DIAGNOSIS — E2839 Other primary ovarian failure: Secondary | ICD-10-CM

## 2020-07-18 DIAGNOSIS — M5030 Other cervical disc degeneration, unspecified cervical region: Secondary | ICD-10-CM

## 2020-07-18 DIAGNOSIS — Z8709 Personal history of other diseases of the respiratory system: Secondary | ICD-10-CM

## 2020-07-18 DIAGNOSIS — N952 Postmenopausal atrophic vaginitis: Secondary | ICD-10-CM

## 2020-07-18 MED ORDER — DENOSUMAB 60 MG/ML ~~LOC~~ SOSY
60.0000 mg | PREFILLED_SYRINGE | Freq: Once | SUBCUTANEOUS | Status: AC
Start: 2020-07-18 — End: 2020-07-18
  Administered 2020-07-18: 60 mg via SUBCUTANEOUS

## 2020-07-18 NOTE — Progress Notes (Signed)
Pharmacy Note  Subjective:   Patient presents to clinic today to receive bi-annual dose of Prolia.  Patient running a fever or have signs/symptoms of infection? No  Patient currently on antibiotics for the treatment of infection? No  Patient had fall in the last 6 months?  No  If yes, did it require medical attention? No   Patient taking calcium 1200 mg daily through diet or supplement and at least 800 units vitamin D? Yes  Objective: CMP     Component Value Date/Time   NA 139 07/14/2020 1031   NA 141 10/31/2017 1415   K 4.4 07/14/2020 1031   CL 103 07/14/2020 1031   CO2 29 07/14/2020 1031   GLUCOSE 94 07/14/2020 1031   BUN 20 07/14/2020 1031   BUN 24 10/31/2017 1415   CREATININE 0.76 07/14/2020 1031   CALCIUM 9.5 07/14/2020 1031   PROT 5.6 (L) 07/14/2020 1031   PROT 5.9 (L) 10/31/2017 1415   ALBUMIN 3.3 (L) 12/04/2019 0605   ALBUMIN 4.3 10/31/2017 1415   AST 19 07/14/2020 1031   ALT 16 07/14/2020 1031   ALKPHOS 49 12/04/2019 0605   BILITOT 0.6 07/14/2020 1031   BILITOT 0.5 10/31/2017 1415   GFRNONAA 75 07/14/2020 1031   GFRAA 87 07/14/2020 1031    CBC    Component Value Date/Time   WBC 9.4 07/14/2020 1031   RBC 4.09 07/14/2020 1031   HGB 12.8 07/14/2020 1031   HGB 12.0 10/31/2017 1415   HGB 12.4 08/06/2013 1128   HCT 38.8 07/14/2020 1031   HCT 37.6 10/31/2017 1415   PLT 292 07/14/2020 1031   PLT 292 10/31/2017 1415   MCV 94.9 07/14/2020 1031   MCV 93 10/31/2017 1415   MCH 31.3 07/14/2020 1031   MCHC 33.0 07/14/2020 1031   RDW 12.4 07/14/2020 1031   RDW 13.5 10/31/2017 1415   LYMPHSABS 893 07/14/2020 1031   EOSABS 19 07/14/2020 1031   BASOSABS 47 07/14/2020 1031    Lab Results  Component Value Date   VD25OH 38 07/14/2020   Assessment/Plan:   Patient tolerated injection well.   All questions encouraged and answered.  Instructed patient to call with any further questions or concerns.  Administrations This Visit    denosumab (PROLIA) injection 60  mg    Admin Date 07/18/2020 Action Given Dose 60 mg Route Subcutaneous Administered By Ellen Henri, CMA

## 2020-07-29 ENCOUNTER — Other Ambulatory Visit (HOSPITAL_COMMUNITY): Payer: Self-pay

## 2020-12-28 ENCOUNTER — Telehealth: Payer: Self-pay | Admitting: Pharmacist

## 2020-12-28 ENCOUNTER — Other Ambulatory Visit (HOSPITAL_COMMUNITY): Payer: Self-pay

## 2020-12-28 DIAGNOSIS — M810 Age-related osteoporosis without current pathological fracture: Secondary | ICD-10-CM

## 2020-12-28 DIAGNOSIS — Z79899 Other long term (current) drug therapy: Secondary | ICD-10-CM

## 2020-12-28 MED ORDER — DENOSUMAB 60 MG/ML ~~LOC~~ SOSY
60.0000 mg | PREFILLED_SYRINGE | SUBCUTANEOUS | 1 refills | Status: DC
  Filled 2020-12-28 – 2021-01-02 (×2): qty 1, 180d supply, fill #0

## 2020-12-28 NOTE — Telephone Encounter (Signed)
[facility] updated with delivery instructions. Scheduled medication in [facility] to be filled on 8/29 and couriered to clinic any time before 9/9.

## 2020-12-28 NOTE — Telephone Encounter (Signed)
Paitent due for Prolia on or after 01/14/21.  She has OV with Sherron Ales, PA-C on 01/16/21. Last DEXA in 05/04/20 and due for next DEXA in December 2023.  Future lab orders placed for CBC or CMP  MyChart message sent to patient to advise that she can come for lab this week, prior to appointment we can schedule for her to receive Prolia at that visit  Routing to [facility]. Rx for Prolia sent to [facility] to be couriered to clinic for appt on 01/16/21  Chesley Mires, PharmD, MPH, BCPS Clinical Pharmacist (Rheumatology and Pulmonology)

## 2021-01-02 ENCOUNTER — Telehealth: Payer: Self-pay

## 2021-01-02 ENCOUNTER — Other Ambulatory Visit (HOSPITAL_COMMUNITY): Payer: Self-pay

## 2021-01-02 NOTE — Telephone Encounter (Addendum)
Patient will stop by clinic tomorrow morning for labs (advised that she may have to wait) since she will be in town for another appt.  Recommended she come after 1:30pm when walk-in lab hours are. Patient inquired if her sodium will be checked, which I confirmed will be.  Orders for CBC and CMP already in place  Chesley Mires, PharmD, MPH, BCPS Clinical Pharmacist (Rheumatology and Pulmonology)

## 2021-01-02 NOTE — Telephone Encounter (Signed)
Patient left a voicemail stating she received a letter from the pharmacist and requested a return call regarding her labwork.

## 2021-01-02 NOTE — Telephone Encounter (Signed)
Returned call. Patient stopping by labs tomorrow, 01/03/21  Chesley Mires, PharmD, MPH, BCPS Clinical Pharmacist (Rheumatology and Pulmonology)

## 2021-01-02 NOTE — Progress Notes (Deleted)
Office Visit Note  Patient: Megan Logan             Date of Birth: 18-May-1940           MRN: [id number]             PCP: Barbie Banner, MD Referring: Barbie Banner, MD Visit Date: 01/16/2021 Occupation: @GUAROCC @  Subjective:  No chief complaint on file.   History of Present Illness: Megan Logan is a 80 y.o. female ***   Activities of Daily Living:  Patient reports morning stiffness for *** {minute/hour:19697}.   Patient {ACTIONS;DENIES/REPORTS:21021675::"Denies"} nocturnal pain.  Difficulty dressing/grooming: {ACTIONS;DENIES/REPORTS:21021675::"Denies"} Difficulty climbing stairs: {ACTIONS;DENIES/REPORTS:21021675::"Denies"} Difficulty getting out of chair: {ACTIONS;DENIES/REPORTS:21021675::"Denies"} Difficulty using hands for taps, buttons, cutlery, and/or writing: {ACTIONS;DENIES/REPORTS:21021675::"Denies"}  No Rheumatology ROS completed.   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   Volume depletion, gastrointestinal loss 12/04/2019   Hyponatremia 12/03/2019   Dizziness 12/03/2019   Ketonuria 12/03/2019   Metabolic acidosis 12/03/2019   Chronic rhinitis 02/19/2019   Primary osteoarthritis involving multiple joints 02/19/2019   DDD (degenerative disc disease), cervical 12/15/2018   Dyslipidemia 12/15/2018   History of asthma 12/15/2018   Elevated hemoglobin A1c 11/14/2017   Chronic neck pain 09/19/2016   Asthma 07/07/2015   Eczema 07/07/2015   Gastroesophageal reflux disease without esophagitis 07/07/2015   Restless legs syndrome 07/07/2015   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   Unspecified vitamin D deficiency 08/06/2013   Age-related osteoporosis without current pathological fracture 08/04/2012   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   Osteoporosis 08/04/2012    Past Medical History:  Diagnosis Date   Anemia    diet  related   Asthma    Atrophic vaginitis    Colitis    Hx of endometriosis    took Danacrine   Hypercholesterolemia    Osteoporosis    spine off Actonel sinsce 09/2010- Drug Holiday   Premature ovarian failure age 70   HRT age 39 - 12/2000    Family History  Problem Relation Age of Onset   Prostate cancer Father 30   Alzheimer's disease Mother 36   Other Sister        dizziness   Cancer Sister 70       Brain and Lung   Ulcerative colitis Sister    Migraines Sister    Cancer - Colon Maternal Grandfather 38   Irritable bowel syndrome Daughter    Colitis Daughter    Past Surgical History:  Procedure Laterality Date   HYSTEROSCOPY  1996   DUB--wnl   NASAL SINUS SURGERY  40's and 50's   TUBAL LIGATION Bilateral 1982   Social History   Social History Narrative   Not on file   Immunization History  Administered Date(s) Administered   Fluad Quad(high Dose 65+) 01/15/2019   Influenza Split 02/24/2017   Influenza, High Dose Seasonal PF 02/26/2014, 02/24/2017, 02/24/2018, 01/15/2019, 02/11/2020   Influenza,inj,Quad PF,6+ Mos 02/26/2014   Influenza,inj,quad, With Preservative 02/04/2017   Moderna Sars-Covid-2 Vaccination 05/19/2019, 06/17/2019, 02/29/2020   Pneumococcal Conjugate-13 05/29/2015   Pneumococcal Polysaccharide-23 03/04/2013   Tdap 09/29/2015   Zoster Recombinat (Shingrix) 08/26/2017, 11/10/2017   Zoster, Live 05/07/2007, 08/26/2017, 11/10/2017     Objective: Vital Signs: LMP 05/07/1982 (Approximate)    Physical Exam   Musculoskeletal Exam: ***  CDAI Exam: CDAI Score: -- Patient Global: --; Provider Global: -- Swollen: --; Tender: --  Joint Exam 01/16/2021   No joint exam has been documented for this visit   There is currently no information documented on the homunculus. Go to the Rheumatology activity and complete the homunculus joint exam.  Investigation: No additional findings.  Imaging: No results found.  Recent Labs: Lab Results  Component  Value Date   WBC 9.4 07/14/2020   HGB 12.8 07/14/2020   PLT 292 07/14/2020   NA 139 07/14/2020   K 4.4 07/14/2020   CL 103 07/14/2020   CO2 29 07/14/2020   GLUCOSE 94 07/14/2020   BUN 20 07/14/2020   CREATININE 0.76 07/14/2020   BILITOT 0.6 07/14/2020   ALKPHOS 49 12/04/2019   AST 19 07/14/2020   ALT 16 07/14/2020   PROT 5.6 (L) 07/14/2020   ALBUMIN 3.3 (L) 12/04/2019   CALCIUM 9.5 07/14/2020   GFRAA 87 07/14/2020    Speciality Comments: Prolia: 12/24/18, 06/26/19, 12/31/19, 07/14/20, 01/14/21  Procedures:  No procedures performed Allergies: Cephalexin, Erythromycin base, Hydrocodone bit-homatrop mbr, Levofloxacin, Penicillins, Prednisone, Sulfamethoxazole-trimethoprim, and Augmentin [amoxicillin-pot clavulanate]   Assessment / Plan:     Visit Diagnoses: No diagnosis found.  Orders: No orders of the defined types were placed in this encounter.  No orders of the defined types were placed in this encounter.   Face-to-face time spent with patient was *** minutes. Greater than 50% of time was spent in counseling and coordination of care.  Follow-Up Instructions: No follow-ups on file.   Ellen Henri, CMA  Note - This record has been created using Animal nutritionist.  Chart creation errors have been sought, but may not always  have been located. Such creation errors do not reflect on  the standard of medical care.

## 2021-01-03 ENCOUNTER — Other Ambulatory Visit: Payer: Self-pay

## 2021-01-03 DIAGNOSIS — M810 Age-related osteoporosis without current pathological fracture: Secondary | ICD-10-CM

## 2021-01-03 DIAGNOSIS — Z79899 Other long term (current) drug therapy: Secondary | ICD-10-CM

## 2021-01-03 NOTE — Telephone Encounter (Signed)
Prolia will be given at patient's appointment on 01/16/2021.

## 2021-01-03 NOTE — Telephone Encounter (Signed)
Prolia received in the office from [facility]. Placed in fridge. Thanks!

## 2021-01-04 LAB — CBC WITH DIFFERENTIAL/PLATELET
Absolute Monocytes: 659 cells/uL (ref 200–950)
Basophils Absolute: 62 cells/uL (ref 0–200)
Basophils Relative: 0.7 %
Eosinophils Absolute: 27 cells/uL (ref 15–500)
Eosinophils Relative: 0.3 %
HCT: 41.1 % (ref 35.0–45.0)
Hemoglobin: 13.1 g/dL (ref 11.7–15.5)
Lymphs Abs: 1015 cells/uL (ref 850–3900)
MCH: 30.8 pg (ref 27.0–33.0)
MCHC: 31.9 g/dL — ABNORMAL LOW (ref 32.0–36.0)
MCV: 96.5 fL (ref 80.0–100.0)
MPV: 10.5 fL (ref 7.5–12.5)
Monocytes Relative: 7.4 %
Neutro Abs: 7138 cells/uL (ref 1500–7800)
Neutrophils Relative %: 80.2 %
Platelets: 234 10*3/uL (ref 140–400)
RBC: 4.26 10*6/uL (ref 3.80–5.10)
RDW: 12.7 % (ref 11.0–15.0)
Total Lymphocyte: 11.4 %
WBC: 8.9 10*3/uL (ref 3.8–10.8)

## 2021-01-04 LAB — COMPREHENSIVE METABOLIC PANEL
AG Ratio: 3 (calc) — ABNORMAL HIGH (ref 1.0–2.5)
ALT: 21 U/L (ref 6–29)
AST: 22 U/L (ref 10–35)
Albumin: 4.5 g/dL (ref 3.6–5.1)
Alkaline phosphatase (APISO): 54 U/L (ref 37–153)
BUN: 18 mg/dL (ref 7–25)
CO2: 28 mmol/L (ref 20–32)
Calcium: 9.1 mg/dL (ref 8.6–10.4)
Chloride: 101 mmol/L (ref 98–110)
Creat: 0.8 mg/dL (ref 0.60–1.00)
Globulin: 1.5 g/dL (calc) — ABNORMAL LOW (ref 1.9–3.7)
Glucose, Bld: 82 mg/dL (ref 65–99)
Potassium: 4.5 mmol/L (ref 3.5–5.3)
Sodium: 136 mmol/L (ref 135–146)
Total Bilirubin: 0.7 mg/dL (ref 0.2–1.2)
Total Protein: 6 g/dL — ABNORMAL LOW (ref 6.1–8.1)

## 2021-01-04 NOTE — Progress Notes (Signed)
CBC WNL.  Total protein remains low but has improved.  Globulin is slightly low but stable.  Rest of CMP WNL.  We will continue to monitor.

## 2021-01-09 ENCOUNTER — Other Ambulatory Visit (HOSPITAL_COMMUNITY): Payer: Self-pay | Admitting: Obstetrics and Gynecology

## 2021-01-09 DIAGNOSIS — Z1231 Encounter for screening mammogram for malignant neoplasm of breast: Secondary | ICD-10-CM

## 2021-01-16 ENCOUNTER — Ambulatory Visit: Payer: Medicare PPO | Admitting: Physician Assistant

## 2021-01-16 DIAGNOSIS — M5030 Other cervical disc degeneration, unspecified cervical region: Secondary | ICD-10-CM

## 2021-01-16 DIAGNOSIS — E559 Vitamin D deficiency, unspecified: Secondary | ICD-10-CM

## 2021-01-16 DIAGNOSIS — M810 Age-related osteoporosis without current pathological fracture: Secondary | ICD-10-CM

## 2021-01-16 DIAGNOSIS — Z8719 Personal history of other diseases of the digestive system: Secondary | ICD-10-CM

## 2021-01-16 DIAGNOSIS — E2839 Other primary ovarian failure: Secondary | ICD-10-CM

## 2021-01-16 DIAGNOSIS — M19041 Primary osteoarthritis, right hand: Secondary | ICD-10-CM

## 2021-01-16 DIAGNOSIS — N952 Postmenopausal atrophic vaginitis: Secondary | ICD-10-CM

## 2021-01-16 DIAGNOSIS — E785 Hyperlipidemia, unspecified: Secondary | ICD-10-CM

## 2021-01-16 DIAGNOSIS — Z8709 Personal history of other diseases of the respiratory system: Secondary | ICD-10-CM

## 2021-01-16 DIAGNOSIS — M1711 Unilateral primary osteoarthritis, right knee: Secondary | ICD-10-CM

## 2021-01-16 DIAGNOSIS — E2740 Unspecified adrenocortical insufficiency: Secondary | ICD-10-CM

## 2021-01-16 NOTE — Telephone Encounter (Signed)
Patient left VM stating she has sinus infection and will call to r/s appointment to receive Prolia once back to baseline.  Routing to [employer] as FYI  Chesley Mires, PharmD, MPH, BCPS Clinical Pharmacist (Rheumatology and Pulmonology)

## 2021-01-24 ENCOUNTER — Telehealth: Payer: Self-pay

## 2021-01-24 NOTE — Telephone Encounter (Signed)
Patient requested a return call regarding the Covid booster vaccine and if it will affect when she can have her Prolia injection which is scheduled for 02/01/21.

## 2021-01-24 NOTE — Telephone Encounter (Addendum)
ATC patient to assess if she has recovered from sinus infection to have Prolia r/s. Labs were drawn on 01/03/21.  Left VM requesting return call  Chesley Mires, PharmD, MPH, BCPS Clinical Pharmacist (Rheumatology and Pulmonology)

## 2021-01-25 ENCOUNTER — Telehealth: Payer: Self-pay | Admitting: Rheumatology

## 2021-01-25 NOTE — Telephone Encounter (Signed)
Patient states she is returning your call

## 2021-01-25 NOTE — Telephone Encounter (Signed)
Returned call to patient. She has COVID19 vaccine scheduled for 02/09/21. Reviewed that there is no clinical interaction. She verbalized understanding

## 2021-01-25 NOTE — Telephone Encounter (Signed)
Returned call to patient. She has COVID19 vaccine scheduled for 02/09/21. Reviewed that there is no clinical interaction. She verbalized understanding 

## 2021-01-25 NOTE — Progress Notes (Signed)
Office Visit Note  Patient: Megan Logan             Date of Birth: 09-05-1940           MRN: [id number]             PCP: Barbie Banner, MD Referring: Barbie Banner, MD Visit Date: 02/01/2021 Occupation: @GUAROCC @  Subjective:  Administration of prolia   History of Present Illness: Megan Logan is a 80 y.o. female with history of osteoporosis and osteoarthritis.  She presents today for her biannual prolia injection.  Her last injection was on 07/18/20. She has tolerated prolia in the past and has not had any injection site reactions. She is taking vitamin D 2000 units daily and calcium carbonate 600 mg twice daily. She denies any recent fractures.  She remains on hydrocortisone prescribed by her endocrinologist.  She has a wellness check in a couple of weeks at which time she will have updated lab work. She has ongoing neck pain and stiffness.  She had an injection performed by Dr. 07/20/20 about 6 months ago.  She has tried physical therapy in the past with minimal relief.  She continues to have intermittent pain in both hands but denies any joint swelling.  She requested a refill of voltaren gel.      Activities of Daily Living:  Patient reports morning stiffness for 5-10 minutes.   Patient Reports nocturnal pain.  Difficulty dressing/grooming: Denies Difficulty climbing stairs: Denies Difficulty getting out of chair: Denies Difficulty using hands for taps, buttons, cutlery, and/or writing: Denies  Review of Systems  Constitutional:  Positive for fatigue.  HENT:  Negative for mouth sores, mouth dryness and nose dryness.   Eyes:  Negative for pain, itching and dryness.  Respiratory:  Negative for shortness of breath and difficulty breathing.   Cardiovascular:  Negative for chest pain and palpitations.  Gastrointestinal:  Positive for constipation. Negative for blood in stool and diarrhea.  Endocrine: Negative for increased urination.  Genitourinary:  Negative for difficulty  urinating.  Musculoskeletal:  Positive for joint pain, joint pain, joint swelling and morning stiffness. Negative for myalgias, muscle tenderness and myalgias.  Skin:  Negative for color change, rash and redness.  Allergic/Immunologic: Positive for susceptible to infections.  Neurological:  Positive for dizziness and numbness. Negative for headaches, memory loss and weakness.  Hematological:  Positive for bruising/bleeding tendency.  Psychiatric/Behavioral:  Negative for confusion.    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   Volume depletion, gastrointestinal loss 12/04/2019   Hyponatremia 12/03/2019   Dizziness 12/03/2019   Ketonuria 12/03/2019   Metabolic acidosis 12/03/2019   Chronic rhinitis 02/19/2019   Primary osteoarthritis involving multiple joints 02/19/2019   DDD (degenerative disc disease), cervical 12/15/2018   Dyslipidemia 12/15/2018   History of asthma 12/15/2018   Elevated hemoglobin A1c 11/14/2017   Chronic neck pain 09/19/2016   Asthma 07/07/2015   Eczema 07/07/2015   Gastroesophageal reflux disease without esophagitis 07/07/2015   Restless legs syndrome 07/07/2015   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   Unspecified vitamin D deficiency 08/06/2013   Age-related osteoporosis without current pathological fracture 08/04/2012   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   Osteoporosis 08/04/2012    Past Medical History:  Diagnosis Date   Anemia    diet related   Asthma    Atrophic vaginitis    Colitis  Hx of endometriosis    took Danacrine   Hypercholesterolemia    Osteoporosis    spine off Actonel sinsce 09/2010- Drug Holiday   Premature ovarian failure age 7   HRT age 88 - 12/2000    Family History  Problem Relation Age of Onset   Prostate cancer Father 62   Alzheimer's disease Mother 36   Other Sister        dizziness    Cancer Sister 55       Brain and Lung   Ulcerative colitis Sister    Migraines Sister    Cancer - Colon Maternal Grandfather 81   Irritable bowel syndrome Daughter    Colitis Daughter    Past Surgical History:  Procedure Laterality Date   HYSTEROSCOPY  1996   DUB--wnl   NASAL SINUS SURGERY  40's and 50's   TUBAL LIGATION Bilateral 1982   Social History   Social History Narrative   Not on file   Immunization History  Administered Date(s) Administered   Fluad Quad(high Dose 65+) 01/15/2019   Influenza Split 02/24/2017   Influenza, High Dose Seasonal PF 02/26/2014, 02/24/2017, 02/24/2018, 01/15/2019, 02/11/2020   Influenza,inj,Quad PF,6+ Mos 02/26/2014   Influenza,inj,quad, With Preservative 02/04/2017   Moderna Sars-Covid-2 Vaccination 05/19/2019, 06/17/2019, 02/29/2020, 08/10/2020   Pneumococcal Conjugate-13 05/29/2015   Pneumococcal Polysaccharide-23 03/04/2013   Tdap 09/29/2015   Zoster Recombinat (Shingrix) 08/26/2017, 11/10/2017   Zoster, Live 05/07/2007, 08/26/2017, 11/10/2017     Objective: Vital Signs: BP (!) 144/84 (BP Location: Left Arm, Patient Position: Sitting, Cuff Size: Normal)   Pulse 77   Ht 5' 0.5" (1.537 m)   Wt 104 lb 9.6 oz (47.4 kg)   LMP 05/07/1982 (Approximate)   BMI 20.09 kg/m    Physical Exam Vitals and nursing note reviewed.  Constitutional:      Appearance: She is well-developed.  HENT:     Head: Normocephalic and atraumatic.  Eyes:     Conjunctiva/sclera: Conjunctivae normal.  Pulmonary:     Effort: Pulmonary effort is normal.  Abdominal:     Palpations: Abdomen is soft.  Musculoskeletal:     Cervical back: Normal range of motion.  Skin:    General: Skin is warm and dry.     Capillary Refill: Capillary refill takes less than 2 seconds.  Neurological:     Mental Status: She is alert and oriented to person, place, and time.  Psychiatric:        Behavior: Behavior normal.     Musculoskeletal Exam: C-spine limited ROM with  lateral rotation.  Postural thoracic kyphosis.  No midline spinal tenderness.  No SI joint tenderness.  Shoulder joints, elbow joints, wrist joints, MCPs, PIPs, and DIPs good ROM with no synovitis.  PIP and DIP thickening consistent with osteoarthritis of both hands.  Tenderness of the left 3rd and 4th PIP joints.  Hip joints have good ROM with no discomfort.  Knee joints have good ROM with no warmth or effusion.  Ankle joints have good ROM with no tenderness or synovitis.  No tenderness over MTP joints.   CDAI Exam: CDAI Score: -- Patient Global: --; Provider Global: -- Swollen: --; Tender: -- Joint Exam 02/01/2021   No joint exam has been documented for this visit   There is currently no information documented on the homunculus. Go to the Rheumatology activity and complete the homunculus joint exam.  Investigation: No additional findings.  Imaging: No results found.  Recent Labs: Lab Results  Component Value Date   WBC  8.9 01/03/2021   HGB 13.1 01/03/2021   PLT 234 01/03/2021   NA 136 01/03/2021   K 4.5 01/03/2021   CL 101 01/03/2021   CO2 28 01/03/2021   GLUCOSE 82 01/03/2021   BUN 18 01/03/2021   CREATININE 0.80 01/03/2021   BILITOT 0.7 01/03/2021   ALKPHOS 49 12/04/2019   AST 22 01/03/2021   ALT 21 01/03/2021   PROT 6.0 (L) 01/03/2021   ALBUMIN 3.3 (L) 12/04/2019   CALCIUM 9.1 01/03/2021   GFRAA 87 07/14/2020    Speciality Comments: Prolia: 12/24/18, 06/26/19, 12/31/19, 07/14/20, 01/14/21  Procedures:  No procedures performed Allergies: Cephalexin, Erythromycin base, Hydrocodone bit-homatrop mbr, Levofloxacin, Penicillins, Prednisone, Sulfamethoxazole-trimethoprim, and Augmentin [amoxicillin-pot clavulanate]   Assessment / Plan:     Visit Diagnoses: Age-related osteoporosis without current pathological fracture - DEXA 05/04/20: BMD as determined from AP Spine L1-L4 (L2,L3) is 0.820 g/cm2 with a T-Score of -2.9-Osteoporosis: Previously on Actonel but discontinued due to  severe gastroesophageal reflux.  She was started on Prolia on 12/24/2018.  She has been tolerating Prolia every 6 months without any side effects or injection site reactions.  Her last Prolia injection was administered on 07/18/2020.  CBC and CMP were updated on 01/03/2021 in anticipation for her next injection: Calcium was 9.1.  Vitamin D was 38 on 07/14/2020.  She has been taking calcium 600 mg twice daily and vitamin D 2000 units daily.  She presented today for her biannual Prolia injection.  She tolerated the injection with no complications.  She will be due to update her bone density in December 2023.  She will follow-up in the office in 6 months.  Vitamin D deficiency: She is taking vitamin D 2000 units daily.  Vitamin D was 38 on 07/14/2020.  Primary osteoarthritis of both hands: She has PIP and DIP thickening consistent with osteoarthritis of both hands.  She experiences intermittent pain and stiffness in both hands.  No inflammation was noted on examination today. She has some tenderness over the left 3rd and 4th PIP joints. A refill of voltaren gel was sent to the pharmacy today.  Discussed the use of arthritis compression gloves.  Discussed the importance of joint protection and muscle strengthening.   Primary osteoarthritis of right knee - X-ray showed moderate osteoarthritis and severe chondromalacia patella.  She has good ROM of the right knee joint with no discomfort.  Crepitus noted.  No warmth or effusion.   DDD (degenerative disc disease), cervical - Chronic pain and stiffness.  She had an injection performed by Dr. Ethelene Hal about 6 months ago per patient. She has tried physical therapy in the past with minimal relief in her symptoms. She is not experiencing any symptoms of radiculopathy at this time.   Adrenal insufficiency (HCC): She remains on hydrocortisone 10 mg 2 times daily by her endocrinologist.   Other medical conditions are listed as follows:   History of  asthma  Dyslipidemia  Postmenopausal atrophic vaginitis  History of gastroesophageal reflux (GERD)  Premature ovarian failure  Orders: No orders of the defined types were placed in this encounter.  No orders of the defined types were placed in this encounter.    Follow-Up Instructions: Return in about 6 months (around 08/01/2021) for Osteoarthritis, Osteoporosis.   Gearldine Bienenstock, PA-C  Note - This record has been created using Dragon software.  Chart creation errors have been sought, but may not always  have been located. Such creation errors do not reflect on  the standard of medical care.

## 2021-01-25 NOTE — Telephone Encounter (Addendum)
Recommend she delay COVID19 vaccine for 4-7 days after Prolia if she plans to receive in the same arm. No clinical interaction between two - simply potential for injection site reaction. Therefore she can receive Prolia injection in one arm and COVID19 vaccine in alternate arm.  Called patient to advise but unable to reach. Left VM requesting return call. Routing to Richardo Priest as FYI in case patient returns call  Chesley Mires, PharmD, MPH, BCPS Clinical Pharmacist (Rheumatology and Pulmonology)

## 2021-02-01 ENCOUNTER — Encounter: Payer: Self-pay | Admitting: Physician Assistant

## 2021-02-01 ENCOUNTER — Other Ambulatory Visit: Payer: Self-pay

## 2021-02-01 ENCOUNTER — Ambulatory Visit: Payer: Medicare PPO | Admitting: Physician Assistant

## 2021-02-01 VITALS — BP 144/84 | HR 77 | Ht 60.5 in | Wt 104.6 lb

## 2021-02-01 DIAGNOSIS — Z8709 Personal history of other diseases of the respiratory system: Secondary | ICD-10-CM

## 2021-02-01 DIAGNOSIS — M810 Age-related osteoporosis without current pathological fracture: Secondary | ICD-10-CM | POA: Diagnosis not present

## 2021-02-01 DIAGNOSIS — E2740 Unspecified adrenocortical insufficiency: Secondary | ICD-10-CM

## 2021-02-01 DIAGNOSIS — E559 Vitamin D deficiency, unspecified: Secondary | ICD-10-CM

## 2021-02-01 DIAGNOSIS — M5030 Other cervical disc degeneration, unspecified cervical region: Secondary | ICD-10-CM

## 2021-02-01 DIAGNOSIS — N952 Postmenopausal atrophic vaginitis: Secondary | ICD-10-CM

## 2021-02-01 DIAGNOSIS — M1711 Unilateral primary osteoarthritis, right knee: Secondary | ICD-10-CM

## 2021-02-01 DIAGNOSIS — Z8719 Personal history of other diseases of the digestive system: Secondary | ICD-10-CM

## 2021-02-01 DIAGNOSIS — E2839 Other primary ovarian failure: Secondary | ICD-10-CM

## 2021-02-01 DIAGNOSIS — M19041 Primary osteoarthritis, right hand: Secondary | ICD-10-CM | POA: Diagnosis not present

## 2021-02-01 DIAGNOSIS — M19042 Primary osteoarthritis, left hand: Secondary | ICD-10-CM

## 2021-02-01 DIAGNOSIS — E785 Hyperlipidemia, unspecified: Secondary | ICD-10-CM

## 2021-02-01 MED ORDER — DENOSUMAB 60 MG/ML ~~LOC~~ SOSY
60.0000 mg | PREFILLED_SYRINGE | Freq: Once | SUBCUTANEOUS | Status: AC
  Administered 2021-02-01: 60 mg via SUBCUTANEOUS

## 2021-02-01 MED ORDER — DICLOFENAC SODIUM 1 % EX GEL: CUTANEOUS | 2 refills | Status: AC

## 2021-02-01 NOTE — Progress Notes (Signed)
Pharmacy Note  Subjective:   Patient presents to clinic today to receive bi-annual dose of Prolia.  Patient running a fever or have signs/symptoms of infection? No  Patient currently on antibiotics for the treatment of infection? No  Patient had fall in the last 6 months?  Yes  If yes, did it require medical attention? No   Patient taking calcium 1200 mg daily through diet or supplement and at least 800 units vitamin D? Yes  Objective: CMP     Component Value Date/Time   NA 136 01/03/2021 1145   NA 141 10/31/2017 1415   K 4.5 01/03/2021 1145   CL 101 01/03/2021 1145   CO2 28 01/03/2021 1145   GLUCOSE 82 01/03/2021 1145   BUN 18 01/03/2021 1145   BUN 24 10/31/2017 1415   CREATININE 0.80 01/03/2021 1145   CALCIUM 9.1 01/03/2021 1145   PROT 6.0 (L) 01/03/2021 1145   PROT 5.9 (L) 10/31/2017 1415   ALBUMIN 3.3 (L) 12/04/2019 0605   ALBUMIN 4.3 10/31/2017 1415   AST 22 01/03/2021 1145   ALT 21 01/03/2021 1145   ALKPHOS 49 12/04/2019 0605   BILITOT 0.7 01/03/2021 1145   BILITOT 0.5 10/31/2017 1415   GFRNONAA 75 07/14/2020 1031   GFRAA 87 07/14/2020 1031    CBC    Component Value Date/Time   WBC 8.9 01/03/2021 1145   RBC 4.26 01/03/2021 1145   HGB 13.1 01/03/2021 1145   HGB 12.0 10/31/2017 1415   HGB 12.4 08/06/2013 1128   HCT 41.1 01/03/2021 1145   HCT 37.6 10/31/2017 1415   PLT 234 01/03/2021 1145   PLT 292 10/31/2017 1415   MCV 96.5 01/03/2021 1145   MCV 93 10/31/2017 1415   MCH 30.8 01/03/2021 1145   MCHC 31.9 (L) 01/03/2021 1145   RDW 12.7 01/03/2021 1145   RDW 13.5 10/31/2017 1415   LYMPHSABS 1,015 01/03/2021 1145   EOSABS 27 01/03/2021 1145   BASOSABS 62 01/03/2021 1145    Lab Results  Component Value Date   VD25OH 38 07/14/2020    T-score: -2.9 04/2020  Assessment/Plan:   Administrations This Visit     denosumab (PROLIA) injection 60 mg     Admin Date 02/01/2021 Action Given Dose 60 mg Route Subcutaneous Administered By Henriette Combs, LPN             Patient tolerated injection well.    All questions encouraged and answered.  Instructed patient to call with any further questions or concerns.

## 2021-02-09 ENCOUNTER — Ambulatory Visit (HOSPITAL_COMMUNITY): Payer: Medicare PPO

## 2021-02-17 ENCOUNTER — Ambulatory Visit (HOSPITAL_COMMUNITY): Payer: Medicare PPO

## 2021-02-20 ENCOUNTER — Other Ambulatory Visit: Payer: Self-pay

## 2021-02-20 ENCOUNTER — Ambulatory Visit (HOSPITAL_COMMUNITY)
Admission: RE | Admit: 2021-02-20 | Discharge: 2021-02-20 | Disposition: A | Discharge status: Home / Self Care | Payer: Medicare PPO | Source: Ambulatory Visit | Attending: Obstetrics and Gynecology | Admitting: Obstetrics and Gynecology

## 2021-02-20 ENCOUNTER — Ambulatory Visit (HOSPITAL_COMMUNITY): Payer: Medicare PPO

## 2021-02-20 DIAGNOSIS — Z1231 Encounter for screening mammogram for malignant neoplasm of breast: Secondary | ICD-10-CM | POA: Diagnosis not present

## 2021-04-10 NOTE — Progress Notes (Deleted)
80 y.o. G1P1001 Married White or Caucasian Not Hispanic or Latino female here for breast and pelvic exam.      Patient's last menstrual period was 05/07/1982 (approximate).          Sexually active: No.  The current method of family planning is post menopausal status.    Exercising: {yes no:314532}  {types:19826} Smoker:  {YES [id number]  Health Maintenance: Pap:  07/2009 neg  History of abnormal Pap:  no MMG:  02/20/21 density C Bi-rads 1 neg  BMD:   05/04/20 osteoporotic  Colonoscopy: 2020 normal  TDaP:  09/29/15  Gardasil: n/a   reports that she has never smoked. She has never used smokeless tobacco. She reports that she does not drink alcohol and does not use drugs.  Past Medical History:  Diagnosis Date   Anemia    diet related   Asthma    Atrophic vaginitis    Colitis    Hx of endometriosis    took Danacrine   Hypercholesterolemia    Osteoporosis    spine off Actonel sinsce 09/2010- Drug [facility]   Premature ovarian failure age 8   HRT age 54 - 12/2000    Past Surgical History:  Procedure Laterality Date   HYSTEROSCOPY  1996   DUB--wnl   NASAL SINUS SURGERY  40's and 50's   TUBAL LIGATION Bilateral 1982    Current Outpatient Medications  Medication Sig Dispense Refill   acetaminophen (TYLENOL) 325 MG tablet Take 650 mg by mouth every 6 (six) hours as needed for mild pain or headache.     azelastine (ASTELIN) 0.1 % nasal spray Place 1 spray into both nostrils 2 (two) times daily. Use in each nostril as directed     BREO ELLIPTA 200-25 MCG/INH AEPB Inhale 1 puff into the lungs daily.  1   calcium carbonate (OS-CAL) 600 MG TABS Take 600 mg by mouth 2 (two) times daily with a meal.     cholecalciferol (VITAMIN D) 1000 units tablet Take 2 capsules by mouth daily.      denosumab (PROLIA) 60 MG/ML SOSY injection Inject 60 mg into the skin every 6 (six) months. Appt 01/16/21 1 mL 1   diclofenac Sodium (VOLTAREN) 1 % GEL Apply 2-4 grams to affected joint 4 times daily as  needed. 400 g 2   gabapentin (NEURONTIN) 300 MG capsule Take 300 mg by mouth at bedtime.      hydrocortisone (CORTEF) 10 MG tablet Take 10 mg by mouth 2 (two) times daily.     losartan (COZAAR) 25 MG tablet Take 1 tablet daily for blood pressure control.     ondansetron (ZOFRAN-ODT) 4 MG disintegrating tablet Take 4 mg by mouth every 8 (eight) hours as needed for nausea/vomiting.     pramipexole (MIRAPEX) 1 MG tablet Take 1 tablet by mouth at bedtime as needed.   5   pseudoephedrine (SUDAFED) 120 MG 12 hr tablet Take 120 mg by mouth every 12 (twelve) hours as needed for congestion.     simvastatin (ZOCOR) 20 MG tablet Take 20 mg by mouth daily at 6 PM.   3   sodium chloride (OCEAN) 0.65 % SOLN nasal spray Place 2 sprays into both nostrils every 4 (four) hours. 30 mL 0   No current facility-administered medications for this visit.    Family History  Problem Relation Age of Onset   Prostate cancer Father 60   Alzheimer's disease Mother 41   Other Sister        dizziness  Cancer Sister 109       Brain and Lung   Ulcerative colitis Sister    Migraines Sister    Cancer - Colon Maternal Grandfather 36   Irritable bowel syndrome Daughter    Colitis Daughter     Review of Systems  Exam:   LMP 05/07/1982 (Approximate)   Weight change: @WEIGHTCHANGE @ Height:      Ht Readings from Last 3 Encounters:  02/01/21 5' 0.5" (1.537 m)  07/18/20 5\' 1"  (1.549 m)  04/07/20 5' (1.524 m)    General appearance: alert, cooperative and appears stated age Head: Normocephalic, without obvious abnormality, atraumatic Neck: no adenopathy, supple, symmetrical, trachea midline and thyroid {CHL AMB PHY EX THYROID NORM DEFAULT:[phone]::"normal to inspection and palpation"} Lungs: clear to auscultation bilaterally Cardiovascular: regular rate and rhythm Breasts: {Exam; breast:13139::"normal appearance, no masses or tenderness"} Abdomen: soft, non-tender; non distended,  no masses,  no  organomegaly Extremities: extremities normal, atraumatic, no cyanosis or edema Skin: Skin color, texture, turgor normal. No rashes or lesions Lymph nodes: Cervical, supraclavicular, and axillary nodes normal. No abnormal inguinal nodes palpated Neurologic: Grossly normal   Pelvic: External genitalia:  no lesions              Urethra:  normal appearing urethra with no masses, tenderness or lesions              Bartholins and Skenes: normal                 Vagina: normal appearing vagina with normal color and discharge, no lesions              Cervix: {CHL AMB PHY EX CERVIX NORM DEFAULT:[phone]::"no lesions"}               Bimanual Exam:  Uterus:  {CHL AMB PHY EX UTERUS NORM DEFAULT:[phone]::"normal size, contour, position, consistency, mobility, non-tender"}              Adnexa: {CHL AMB PHY EX ADNEXA NO MASS [phone]::"no mass, fullness, tenderness"}               Rectovaginal: Confirms               Anus:  normal sphincter tone, no lesions  *** chaperoned for the exam.  A:  Well Woman with normal exam  P:

## 2021-04-12 ENCOUNTER — Ambulatory Visit: Payer: Medicare PPO | Admitting: Obstetrics and Gynecology

## 2021-04-18 NOTE — Progress Notes (Deleted)
80 y.o. G1P1001 Married White or Caucasian Not Hispanic or Latino female here for annual exam.      Patient's last menstrual period was 05/07/1982 (approximate).          Sexually active: {yes no:314532}  The current method of family planning is {contraception:315051}.    Exercising: {yes no:314532}  {types:19826} Smoker:  {YES [id number]  Health Maintenance: Pap:  07/2009  History of abnormal Pap:  no MMG:  02/20/21 density C Bi-rads 1 neg  BMD:   05/04/20 osteoporotic  Colonoscopy: 2020 normal  TDaP:  09/29/15  Gardasil: n/a   reports that she has never smoked. She has never used smokeless tobacco. She reports that she does not drink alcohol and does not use drugs.  Past Medical History:  Diagnosis Date   Anemia    diet related   Asthma    Atrophic vaginitis    Colitis    Hx of endometriosis    took Danacrine   Hypercholesterolemia    Osteoporosis    spine off Actonel sinsce 09/2010- Drug Holiday   Premature ovarian failure age 22   HRT age 34 - 12/2000    Past Surgical History:  Procedure Laterality Date   HYSTEROSCOPY  1996   DUB--wnl   NASAL SINUS SURGERY  40's and 50's   TUBAL LIGATION Bilateral 1982    Current Outpatient Medications  Medication Sig Dispense Refill   acetaminophen (TYLENOL) 325 MG tablet Take 650 mg by mouth every 6 (six) hours as needed for mild pain or headache.     azelastine (ASTELIN) 0.1 % nasal spray Place 1 spray into both nostrils 2 (two) times daily. Use in each nostril as directed     BREO ELLIPTA 200-25 MCG/INH AEPB Inhale 1 puff into the lungs daily.  1   calcium carbonate (OS-CAL) 600 MG TABS Take 600 mg by mouth 2 (two) times daily with a meal.     cholecalciferol (VITAMIN D) 1000 units tablet Take 2 capsules by mouth daily.      denosumab (PROLIA) 60 MG/ML SOSY injection Inject 60 mg into the skin every 6 (six) months. Appt 01/16/21 1 mL 1   diclofenac Sodium (VOLTAREN) 1 % GEL Apply 2-4 grams to affected joint 4 times daily as  needed. 400 g 2   gabapentin (NEURONTIN) 300 MG capsule Take 300 mg by mouth at bedtime.      hydrocortisone (CORTEF) 10 MG tablet Take 10 mg by mouth 2 (two) times daily.     losartan (COZAAR) 25 MG tablet Take 1 tablet daily for blood pressure control.     ondansetron (ZOFRAN-ODT) 4 MG disintegrating tablet Take 4 mg by mouth every 8 (eight) hours as needed for nausea/vomiting.     pramipexole (MIRAPEX) 1 MG tablet Take 1 tablet by mouth at bedtime as needed.   5   pseudoephedrine (SUDAFED) 120 MG 12 hr tablet Take 120 mg by mouth every 12 (twelve) hours as needed for congestion.     simvastatin (ZOCOR) 20 MG tablet Take 20 mg by mouth daily at 6 PM.   3   sodium chloride (OCEAN) 0.65 % SOLN nasal spray Place 2 sprays into both nostrils every 4 (four) hours. 30 mL 0   No current facility-administered medications for this visit.    Family History  Problem Relation Age of Onset   Prostate cancer Father 26   Alzheimer's disease Mother 67   Other Sister        dizziness   Cancer Sister  78       Brain and Lung   Ulcerative colitis Sister    Migraines Sister    Cancer - Colon Maternal Grandfather 51   Irritable bowel syndrome Daughter    Colitis Daughter     Review of Systems  Exam:   LMP 05/07/1982 (Approximate)   Weight change: @WEIGHTCHANGE @ Height:      Ht Readings from Last 3 Encounters:  02/01/21 5' 0.5" (1.537 m)  07/18/20 5\' 1"  (1.549 m)  04/07/20 5' (1.524 m)    General appearance: alert, cooperative and appears stated age Head: Normocephalic, without obvious abnormality, atraumatic Neck: no adenopathy, supple, symmetrical, trachea midline and thyroid {CHL AMB PHY EX THYROID NORM DEFAULT:[phone]::"normal to inspection and palpation"} Lungs: clear to auscultation bilaterally Cardiovascular: regular rate and rhythm Breasts: {Exam; breast:13139::"normal appearance, no masses or tenderness"} Abdomen: soft, non-tender; non distended,  no masses,  no  organomegaly Extremities: extremities normal, atraumatic, no cyanosis or edema Skin: Skin color, texture, turgor normal. No rashes or lesions Lymph nodes: Cervical, supraclavicular, and axillary nodes normal. No abnormal inguinal nodes palpated Neurologic: Grossly normal   Pelvic: External genitalia:  no lesions              Urethra:  normal appearing urethra with no masses, tenderness or lesions              Bartholins and Skenes: normal                 Vagina: normal appearing vagina with normal color and discharge, no lesions              Cervix: {CHL AMB PHY EX CERVIX NORM DEFAULT:[phone]::"no lesions"}               Bimanual Exam:  Uterus:  {CHL AMB PHY EX UTERUS NORM DEFAULT:[phone]::"normal size, contour, position, consistency, mobility, non-tender"}              Adnexa: {CHL AMB PHY EX ADNEXA NO MASS [phone]::"no mass, fullness, tenderness"}               Rectovaginal: Confirms               Anus:  normal sphincter tone, no lesions  *** chaperoned for the exam.  A:  Well Woman with normal exam  P:

## 2021-04-25 ENCOUNTER — Ambulatory Visit: Payer: Medicare PPO | Admitting: Obstetrics and Gynecology

## 2021-05-11 ENCOUNTER — Encounter (HOSPITAL_COMMUNITY)
Admission: RE | Admit: 2021-05-11 | Discharge: 2021-05-11 | Disposition: A | Discharge status: Home / Self Care | Payer: Medicare PPO | Source: Ambulatory Visit | Attending: Ophthalmology | Admitting: Ophthalmology

## 2021-05-11 ENCOUNTER — Encounter (HOSPITAL_COMMUNITY): Payer: Self-pay

## 2021-05-11 HISTORY — DX: Unspecified adrenocortical insufficiency: E27.40

## 2021-05-11 HISTORY — DX: Essential (primary) hypertension: I10

## 2021-05-11 HISTORY — DX: Other specified postprocedural states: Z98.890

## 2021-05-11 HISTORY — DX: Nausea with vomiting, unspecified: R11.2

## 2021-05-11 NOTE — H&P (Signed)
Surgical History & Physical  Patient Name: Megan Logan                          DOB: 03/20/1941  Surgery: Cataract extraction with intraocular lens implant phacoemulsification; Left Eye  Surgeon: Baruch Goldmann MD Surgery Date:  05-15-21 Pre-Op Date:  04-24-21  HPI: A 47 Yr. old female patient PT is present for cataract eval recheck, referred by Dr. Wynetta Emery. The patient complains of difficulty when driving due to glare from headlights or sun/ reading small print on medicine bottles, which began 1 year ago. Both eyes are affected. The episode is gradual. Symptoms occur when the patient is outside. Pt does not use any eye drops and denies any increase in floaters/flashes of light. This is negatively affecting the patient's quality of life and the patient is unable to function adequately in life with the current level of vision. HPI was performed by Baruch Goldmann .  Medical History: Cataracts Macula Degeneration Glaucoma High Blood Pressure LDL  Review of Systems Negative Allergic/Immunologic Negative Cardiovascular Negative Constitutional Negative Ear, Nose, Mouth & Throat Negative Endocrine Negative Eyes Negative Gastrointestinal Negative Genitourinary Negative Hemotologic/Lymphatic Negative Integumentary Negative Musculoskeletal Negative Neurological Negative Psychiatry Negative Respiratory  Social   Never smoked   Medication Celecoxib, Coenzyme-Q, Lipitor, Losartan Potassium, Mesalamine, Estradiol, Gabapentin, Acetaminophen, Breo Ellipta, Calcium Supplement, Vitamin D2, Pramipexole, Sudafed, Montelukast, Hydrocortisone cream, L-lysine,   Sx/Procedures Sinus Surgery,   Drug Allergies  Augmentin, Levofloxacin, Hydrocodone, Amoxicillin,   History & Physical: Heent: Cataract, Left Eye NECK: supple without bruits LUNGS: lungs clear to auscultation CV: regular rate and rhythm Abdomen: soft and non-tender  Impression & Plan: Assessment: 1.  COMBINED FORMS AGE  RELATED CATARACT; Both Eyes (H25.813) 2.  BLEPHARITIS; Right Upper Lid, Right Lower Lid, Left Upper Lid, Left Lower Lid (H01.001, H01.002,H01.004,H01.005) 3.  VITREOUS DETACHMENT PVD; Right Eye [phone])  Plan: 1.  Cataract accounts for the patient's decreased vision. This visual impairment is not correctable with a tolerable change in glasses or contact lenses. Cataract surgery with an implantation of a new lens should significantly improve the visual and functional status of the patient. Discussed all risks, benefits, alternatives, and potential complications. Discussed the procedures and recovery. Patient desires to have surgery. A-scan ordered and performed today for intra-ocular lens calculations. The surgery will be performed in order to improve vision for driving, reading, and for eye examinations. Recommend phacoemulsification with intra-ocular lens. Recommend Dextenza for post-operative pain and inflammation. Left Eye non-dominant - first. Dilates poorly - shugarcaine by protocol. Omidira. Malyugin in room. Consider Clareon Vivity Lens.  2.  Recommend regular lid cleaning.  3.  Old Asymptomatic. RD precautions given. Patient to call with increase in flashing lights/floaters/dark curtain. Symptomatic.

## 2021-05-15 ENCOUNTER — Ambulatory Visit (HOSPITAL_COMMUNITY): Payer: Medicare PPO | Admitting: Anesthesiology

## 2021-05-15 ENCOUNTER — Ambulatory Visit (HOSPITAL_COMMUNITY)
Admission: RE | Admit: 2021-05-15 | Discharge: 2021-05-15 | Disposition: A | Discharge status: Home / Self Care | Payer: Medicare PPO | Attending: Ophthalmology | Admitting: Ophthalmology

## 2021-05-15 ENCOUNTER — Encounter (HOSPITAL_COMMUNITY): Payer: Self-pay | Admitting: Ophthalmology

## 2021-05-15 ENCOUNTER — Other Ambulatory Visit: Payer: Self-pay

## 2021-05-15 ENCOUNTER — Encounter (HOSPITAL_COMMUNITY)
Admission: RE | Disposition: A | Discharge status: Home / Self Care | Payer: Self-pay | Source: Home / Self Care | Attending: Ophthalmology

## 2021-05-15 DIAGNOSIS — H25812 Combined forms of age-related cataract, left eye: Secondary | ICD-10-CM | POA: Insufficient documentation

## 2021-05-15 HISTORY — PX: CATARACT EXTRACTION W/PHACO: SHX586

## 2021-05-15 SURGERY — PHACOEMULSIFICATION, CATARACT, WITH IOL INSERTION
Anesthesia: Monitor Anesthesia Care | Site: Eye | Laterality: Left

## 2021-05-15 MED ORDER — LIDOCAINE HCL 3.5 % OP GEL: 1.0000 "application " | Freq: Once | OPHTHALMIC | Status: DC

## 2021-05-15 MED ORDER — PHENYLEPHRINE-KETOROLAC 1-0.3 % IO SOLN
INTRAOCULAR | Status: AC
  Filled 2021-05-15: qty 4

## 2021-05-15 MED ORDER — SODIUM HYALURONATE 23MG/ML IO SOSY: PREFILLED_SYRINGE | INTRAOCULAR | Status: DC | PRN

## 2021-05-15 MED ORDER — PHENYLEPHRINE HCL 2.5 % OP SOLN
1.0000 [drp] | OPHTHALMIC | Status: DC | PRN
  Administered 2021-05-15 (×2): 1 [drp] via OPHTHALMIC

## 2021-05-15 MED ORDER — STERILE WATER FOR IRRIGATION IR SOLN
Status: DC | PRN
  Administered 2021-05-15: 250 mL

## 2021-05-15 MED ORDER — PHENYLEPHRINE-KETOROLAC 1-0.3 % IO SOLN
INTRAOCULAR | Status: DC | PRN
  Administered 2021-05-15: 500 mL via OPHTHALMIC

## 2021-05-15 MED ORDER — SIGHTPATH DOSE#1 NA CHONDROIT SULF-NA HYALURON 20-15 MG/0.5ML IO SOSY
INTRAOCULAR | Status: DC | PRN
  Administered 2021-05-15: .5 mL via INTRAOCULAR

## 2021-05-15 MED ORDER — SODIUM HYALURONATE 10 MG/ML IO SOLUTION: PREFILLED_SYRINGE | INTRAOCULAR | Status: DC | PRN

## 2021-05-15 MED ORDER — POVIDONE-IODINE 5 % OP SOLN
OPHTHALMIC | Status: DC | PRN
  Administered 2021-05-15: 1 via OPHTHALMIC

## 2021-05-15 MED ORDER — SIGHTPATH DOSE#1 SODIUM HYALURONATE 10 MG/ML IO SOLUTION
PREFILLED_SYRINGE | INTRAOCULAR | Status: DC | PRN
  Administered 2021-05-15: 0.55 mL via INTRAOCULAR

## 2021-05-15 MED ORDER — LIDOCAINE HCL (PF) 1 % IJ SOLN
INTRAOCULAR | Status: DC | PRN
  Administered 2021-05-15: 1 mL via OPHTHALMIC

## 2021-05-15 MED ORDER — BSS IO SOLN
INTRAOCULAR | Status: DC | PRN
  Administered 2021-05-15: 15 mL via INTRAOCULAR

## 2021-05-15 MED ORDER — EPINEPHRINE PF 1 MG/ML IJ SOLN
INTRAMUSCULAR | Status: AC
  Filled 2021-05-15: qty 1

## 2021-05-15 MED ORDER — TETRACAINE HCL 0.5 % OP SOLN
1.0000 [drp] | OPHTHALMIC | Status: DC | PRN
  Administered 2021-05-15 (×2): 1 [drp] via OPHTHALMIC

## 2021-05-15 MED ORDER — TROPICAMIDE 1 % OP SOLN
1.0000 [drp] | OPHTHALMIC | Status: DC | PRN
  Administered 2021-05-15 (×2): 1 [drp] via OPHTHALMIC
  Filled 2021-05-15: qty 2

## 2021-05-15 SURGICAL SUPPLY — 14 items
CATARACT SUITE SIGHTPATH (MISCELLANEOUS) ×2 IMPLANT
CLOTH BEACON ORANGE TIMEOUT ST (SAFETY) ×1 IMPLANT
EYE SHIELD UNIVERSAL CLEAR (GAUZE/BANDAGES/DRESSINGS) ×1 IMPLANT
FEE CATARACT SUITE SIGHTPATH (MISCELLANEOUS) IMPLANT
GLOVE SURG UNDER POLY LF SZ7 (GLOVE) ×2 IMPLANT
LENS IOL RAYNER 23.0 (Intraocular Lens) ×2 IMPLANT
LENS IOL RAYONE EMV 23.0 (Intraocular Lens) IMPLANT
NDL HYPO 18GX1.5 BLUNT FILL (NEEDLE) IMPLANT
NEEDLE HYPO 18GX1.5 BLUNT FILL (NEEDLE) ×2 IMPLANT
PAD ARMBOARD 7.5X6 YLW CONV (MISCELLANEOUS) ×1 IMPLANT
SYR TB 1ML LL NO SAFETY (SYRINGE) ×1 IMPLANT
TAPE SURG TRANSPORE 1 IN (GAUZE/BANDAGES/DRESSINGS) IMPLANT
TAPE SURGICAL TRANSPORE 1 IN (GAUZE/BANDAGES/DRESSINGS) ×2
WATER STERILE IRR 250ML POUR (IV SOLUTION) ×1 IMPLANT

## 2021-05-15 NOTE — Discharge Instructions (Addendum)
Please discharge patient when stable, will follow up today with Dr. Wrzosek at the [facility] [place] office immediately following discharge.  Leave shield in place until visit.  All paperwork with discharge instructions will be given at the office.  [facility] [place] Address:  [address]  [place], [place] [address]  

## 2021-05-15 NOTE — Transfer of Care (Signed)
Immediate Anesthesia Transfer of Care Note  Patient: Megan Logan  Procedure(s) Performed: CATARACT EXTRACTION PHACO AND INTRAOCULAR LENS PLACEMENT (IOC) (Left: Eye)  Patient Location: PACU  Anesthesia Type:MAC  Level of Consciousness: awake, alert , oriented and patient cooperative  Airway & Oxygen Therapy: Patient Spontanous Breathing  Post-op Assessment: Report given to RN, Post -op Vital signs reviewed and stable and Patient moving all extremities X 4  Post vital signs: Reviewed and stable  Last Vitals:  Vitals Value Taken Time  BP    Temp    Pulse    Resp    SpO2      Last Pain:  Vitals:   05/15/21 1025  PainSc: 3          Complications: No notable events documented.

## 2021-05-15 NOTE — Interval H&P Note (Signed)
History and Physical Interval Note:  05/15/2021 11:02 AM  Megan Logan  has presented today for surgery, with the diagnosis of combined forms age related cataract, left eye.  The various methods of treatment have been discussed with the patient and family. After consideration of risks, benefits and other options for treatment, the patient has consented to  Procedure(s) with comments: CATARACT EXTRACTION PHACO AND INTRAOCULAR LENS PLACEMENT (IOC) (Left) - left as a surgical intervention.  The patient's history has been reviewed, patient examined, no change in status, stable for surgery.  I have reviewed the patient's chart and labs.  Questions were answered to the patient's satisfaction.     Fabio Pierce

## 2021-05-15 NOTE — Anesthesia Postprocedure Evaluation (Signed)
Anesthesia Post Note  Patient: Megan Logan  Procedure(s) Performed: CATARACT EXTRACTION PHACO AND INTRAOCULAR LENS PLACEMENT (IOC) (Left: Eye)  Patient location during evaluation: Phase II Anesthesia Type: MAC Level of consciousness: awake Pain management: pain level controlled Vital Signs Assessment: post-procedure vital signs reviewed and stable Respiratory status: spontaneous breathing and respiratory function stable Cardiovascular status: blood pressure returned to baseline and stable Postop Assessment: no headache and no apparent nausea or vomiting Anesthetic complications: no Comments: Late entry   No notable events documented.   Last Vitals:  Vitals:   05/15/21 1025 05/15/21 1126  BP: (!) 158/66 (!) 149/71  Pulse: 75 66  Resp: (!) 27 16  Temp: 37 C 36.6 C  SpO2: 100% 100%    Last Pain:  Vitals:   05/15/21 1126  TempSrc: Oral  PainSc: 0-No pain                 Louann Sjogren

## 2021-05-15 NOTE — Anesthesia Preprocedure Evaluation (Signed)
Anesthesia Evaluation  °Patient identified by MRN, date of birth, ID band °Patient awake ° ° ° °Reviewed: °Allergy & Precautions, H&P , NPO status , Patient's Chart, lab work & pertinent test results, reviewed documented beta blocker date and time  ° °History of Anesthesia Complications °(+) PONV and history of anesthetic complications ° °Airway °Mallampati: II ° °TM Distance: >3 FB °Neck ROM: full ° ° ° Dental °no notable dental hx. ° °  °Pulmonary °asthma ,  °  °Pulmonary exam normal °breath sounds clear to auscultation ° ° ° ° ° ° Cardiovascular °Exercise Tolerance: Good °hypertension, negative cardio ROS ° ° °Rhythm:regular Rate:Normal ° ° °  °Neuro/Psych °negative neurological ROS ° negative psych ROS  ° GI/Hepatic °Neg liver ROS, GERD  Medicated,  °Endo/Other  °negative endocrine ROS ° Renal/GU °negative Renal ROS  °negative genitourinary °  °Musculoskeletal ° ° Abdominal °  °Peds ° Hematology ° °(+) Blood dyscrasia, anemia ,   °Anesthesia Other Findings ° ° Reproductive/Obstetrics °negative OB ROS ° °  ° ° ° ° ° ° ° ° ° ° ° ° ° °  °  ° ° ° ° ° ° ° ° °Anesthesia Physical ° °Anesthesia Plan ° °ASA: 2 ° °Anesthesia Plan: MAC  ° °Post-op Pain Management: Minimal or no pain anticipated  ° °Induction:  ° °PONV Risk Score and Plan:  ° °Airway Management Planned:  ° °Additional Equipment:  ° °Intra-op Plan:  ° °Post-operative Plan:  ° °Informed Consent: I have reviewed the patients History and Physical, chart, labs and discussed the procedure including the risks, benefits and alternatives for the proposed anesthesia with the patient or authorized representative who has indicated his/her understanding and acceptance.  ° ° ° °Dental Advisory Given ° °Plan Discussed with: CRNA ° °Anesthesia Plan Comments:   ° ° ° ° ° ° °Anesthesia Quick Evaluation ° °

## 2021-05-15 NOTE — Op Note (Signed)
Date of procedure: 05/15/21  Pre-operative diagnosis: Visually significant age-related combined cataract, Left Eye (H25.812)  Post-operative diagnosis: Visually significant age-related combined cataract, Left Eye (H25.812)  Procedure: Removal of cataract via phacoemulsification and insertion of intra-ocular lens Rayner [id number] +23.0D into the capsular bag of the Left Eye  Attending surgeon: Gerda Diss. Heiress Williamson, MD, MA  Anesthesia: MAC, Topical Akten  Complications: None  Estimated Blood Loss: <25m (minimal)  Specimens: None  Implants: As above  Indications:  Visually significant age-related cataract, Left Eye  Procedure:  The patient was seen and identified in the pre-operative area. The operative eye was identified and dilated.  The operative eye was marked.  Topical anesthesia was administered to the operative eye.     The patient was then to the operative suite and placed in the supine position.  A timeout was performed confirming the patient, procedure to be performed, and all other relevant information.   The patient's face was prepped and draped in the usual fashion for intra-ocular surgery.  A lid speculum was placed into the operative eye and the surgical microscope moved into place and focused.  An inferotemporal paracentesis was created using a 20 gauge paracentesis blade.  Shugarcaine was injected into the anterior chamber.  Viscoelastic was injected into the anterior chamber.  A temporal clear-corneal main wound incision was created using a 2.41mmicrokeratome.  A continuous curvilinear capsulorrhexis was initiated using an irrigating cystitome and completed using capsulorrhexis forceps.  Hydrodissection and hydrodeliniation were performed.  Viscoelastic was injected into the anterior chamber.  A phacoemulsification handpiece and a chopper as a second instrument were used to remove the nucleus and epinucleus. The irrigation/aspiration handpiece was used to remove any remaining  cortical material.   The capsular bag was reinflated with viscoelastic, checked, and found to be intact.  The intraocular lens was inserted into the capsular bag.  The irrigation/aspiration handpiece was used to remove any remaining viscoelastic.  The clear corneal wound and paracentesis wounds were then hydrated and checked with Weck-Cels to be watertight.  The lid-speculum was removed.  The drape was removed.  The patient's face was cleaned with a wet and dry 4x4.  A clear shield was taped over the eye. The patient was taken to the post-operative care unit in good condition, having tolerated the procedure well.  Post-Op Instructions: [facility] and will receive all other orders and instructions.

## 2021-05-16 ENCOUNTER — Encounter (HOSPITAL_COMMUNITY): Payer: Self-pay | Admitting: Ophthalmology

## 2021-05-22 NOTE — H&P (Signed)
Surgical History & Physical  Patient Name: Megan Logan DOB: 30-Oct-1940  Surgery: Cataract extraction with intraocular lens implant phacoemulsification; Right Eye  Surgeon: Fabio Pierce MD Surgery Date:  05-29-21 Pre-Op Date:  05-18-21  HPI: A 76 Yr. old female patient presents for 3 day post op, CE IOL OS. Since the surgery, the patient's vision is improved OS. The patient reports an occasional sharp pain OS, but it is mild. The is using Moxifloxacin TID OS, Ilevro QD OS, and Prednisolone TID OS. The patient denies seeing any floaters or flashes of light. The patient also presents for pre op OD. The patient still has difficulty with glare during the day and at night, from the sun, and from headlight from other cars. The patient states glare from the sun makes it extremely difficult to drive, and she already has difficulty seeing street signs/ traffic signs unless she is really close. This is This is negatively affecting the patient's quality of life. HPI Completed by Dr. Fabio Pierce  Medical History: Cataracts Macula Degeneration Glaucoma High Blood Pressure LDL  Review of Systems Negative Allergic/Immunologic Negative Cardiovascular Negative Constitutional Negative Ear, Nose, Mouth & Throat Negative Endocrine Negative Eyes Negative Gastrointestinal Negative Genitourinary Negative Hemotologic/Lymphatic Negative Integumentary Negative Musculoskeletal Negative Neurological Negative Psychiatry Negative Respiratory  Social   Never smoked   Medication Moxifloxacin, Ilevro, Prednisolone acetate 1%, Celecoxib, Coenzyme-Q, Lipitor, Losartan Potassium, Mesalamine, Estradiol, Gabapentin, Acetaminophen, Breo Ellipta, Calcium Supplement, Vitamin D2, Pramipexole, Sudafed, Montelukast, Hydrocortisone cream, L-lysine,   Sx/Procedures Phaco c IOL OS, Sinus Surgery,   Drug Allergies  Augmentin, Levofloxacin, Hydrocodone, Amoxicillin,   History & Physical: Heent: Cataract, Right  Eye NECK: supple without bruits LUNGS: lungs clear to auscultation CV: regular rate and rhythm Abdomen: soft and non-tender  Impression & Plan: Assessment: 1.  CATARACT EXTRACTION STATUS; Left Eye (Z98.42) 2.  COMBINED FORMS AGE RELATED CATARACT; Right Eye (H25.811)  Plan: 1.  3 days after cataract surgery. Doing well with improved vision and normal eye pressure. Call with any problems or concerns. Continue all drops per instructions  2.  Cataract accounts for the patient's decreased vision. This visual impairment is not correctable with a tolerable change in glasses or contact lenses. Cataract surgery with an implantation of a new lens should significantly improve the visual and functional status of the patient. Discussed all risks, benefits, alternatives, and potential complications. Discussed the procedures and recovery. Patient desires to have surgery. A-scan ordered and performed today for intra-ocular lens calculations. The surgery will be performed in order to improve vision for driving, reading, and for eye examinations. Recommend phacoemulsification with intra-ocular lens. Recommend Dextenza for post-operative pain and inflammation. Right Eye. Surgery required to correct imbalance of vision. Dilates poorly - shugarcaine by protocol. Omidira.

## 2021-05-24 ENCOUNTER — Other Ambulatory Visit: Payer: Self-pay

## 2021-05-24 ENCOUNTER — Encounter (HOSPITAL_COMMUNITY)
Admission: RE | Admit: 2021-05-24 | Discharge: 2021-05-24 | Disposition: A | Discharge status: Home / Self Care | Payer: Medicare PPO | Source: Ambulatory Visit | Attending: Ophthalmology | Admitting: Ophthalmology

## 2021-05-24 ENCOUNTER — Encounter (HOSPITAL_COMMUNITY): Payer: Self-pay

## 2021-05-24 NOTE — Progress Notes (Signed)
81 y.o. [id number] Married White or Caucasian Not Hispanic or Latino female here for annual exam.    She has noticed a slight, intermittent vaginal d/c. No itching, burning or irritation. No vaginal bleeding.   Occasionally sexually active, it is painful when she is sexually active. She previously on estrogen vaginal cream. No baseline vaginal dryness.   Would like to talk about gabapentin. She is on gabapentin for pain and heard information on the news about cognitive decline.     Patient's last menstrual period was 05/07/1982 (approximate).          Sexually active: Yes.    The current method of family planning is post menopausal status.    Exercising: Yes.     Walking  Smoker:  no  Health Maintenance: Pap:  07/2009  History of abnormal Pap:  no MMG:  02/24/21 Bi-rads 1 neg  BMD:   05/04/20 Osteoporotic  Colonoscopy: 2020 normal  TDaP:  2017  Gardasil: n/a   reports that she has never smoked. She has never used smokeless tobacco. She reports that she does not drink alcohol and does not use drugs. Daughter is in [place], no grandchildren.   Past Medical History:  Diagnosis Date   Adrenal insufficiency ([place])    Anemia    diet related   Asthma    Atrophic vaginitis    Colitis    Hx of endometriosis    took Danacrine   Hypercholesterolemia    Hypertension    Osteoporosis    spine off Actonel sinsce 09/2010- [facility]   PONV (postoperative nausea and vomiting)    Premature ovarian failure age 55   HRT age 16 - 12/2000    Past Surgical History:  Procedure Laterality Date   CATARACT EXTRACTION W/PHACO Left 05/15/2021   Procedure: CATARACT EXTRACTION PHACO AND INTRAOCULAR LENS PLACEMENT ([place]);  Surgeon: Baruch Goldmann, MD;  Location: [facility];  Service: Ophthalmology;  Laterality: Left;  CDE: 18.54   HYSTEROSCOPY  1996   DUB--wnl   NASAL SINUS SURGERY  40's and 50's   TUBAL LIGATION Bilateral 1982    Current Outpatient Medications  Medication Sig Dispense Refill    acetaminophen (TYLENOL) 325 MG tablet Take 650 mg by mouth every 6 (six) hours as needed for mild pain or headache.     azelastine (ASTELIN) 0.1 % nasal spray Place 1 spray into both nostrils 2 (two) times daily. Use in each nostril as directed     BREO ELLIPTA 200-25 MCG/INH AEPB Inhale 1 puff into the lungs daily.  1   calcium carbonate (OS-CAL) 600 MG TABS Take 600 mg by mouth 2 (two) times daily with a meal.     cholecalciferol (VITAMIN D) 1000 units tablet Take 2 capsules by mouth daily.      denosumab (PROLIA) 60 MG/ML SOSY injection Inject 60 mg into the skin every 6 (six) months. Appt 01/16/21 1 mL 1   diclofenac Sodium (VOLTAREN) 1 % GEL Apply 2-4 grams to affected joint 4 times daily as needed. 400 g 2   fluticasone (FLONASE ALLERGY RELIEF) 50 MCG/ACT nasal spray      gabapentin (NEURONTIN) 300 MG capsule Take 300 mg by mouth at bedtime.      hydrocortisone (CORTEF) 10 MG tablet Take 10 mg by mouth 2 (two) times daily.     losartan (COZAAR) 25 MG tablet Take 1 tablet daily for blood pressure control.     ondansetron (ZOFRAN-ODT) 4 MG disintegrating tablet Take 4 mg by mouth every 8 (  eight) hours as needed for nausea/vomiting.     pramipexole (MIRAPEX) 1 MG tablet Take 1 tablet by mouth at bedtime as needed.   5   pseudoephedrine (SUDAFED) 120 MG 12 hr tablet Take 120 mg by mouth every 12 (twelve) hours as needed for congestion.     simvastatin (ZOCOR) 20 MG tablet Take 20 mg by mouth daily at 6 PM.   3   sodium chloride (OCEAN) 0.65 % SOLN nasal spray Place 2 sprays into both nostrils every 4 (four) hours. 30 mL 0   No current facility-administered medications for this visit.    Family History  Problem Relation Age of Onset   Prostate cancer Father 53   Alzheimer's disease Mother 66   Other Sister        dizziness   Cancer Sister 79       Brain and Lung   Ulcerative colitis Sister    Migraines Sister    Cancer - Colon Maternal Grandfather 14   Irritable bowel syndrome Daughter     Colitis Daughter     Review of Systems  Genitourinary:  Positive for vaginal discharge.  All other systems reviewed and are negative.  Exam:   BP 110/64    Pulse 67    Ht 5' 0.75" (1.543 m)    Wt 108 lb (49 kg)    LMP 05/07/1982 (Approximate)    SpO2 99%    BMI 20.57 kg/m   Weight change: @WEIGHTCHANGE @ Height:   Height: 5' 0.75" (154.3 cm)  Ht Readings from Last 3 Encounters:  05/26/21 5' 0.75" (1.543 m)  05/24/21 5\' 1"  (1.549 m)  05/11/21 5\' 1"  (1.549 m)    General appearance: alert, cooperative and appears stated age Head: Normocephalic, without obvious abnormality, atraumatic Neck: no adenopathy, supple, symmetrical, trachea midline and thyroid normal to inspection and palpation  Breasts: normal appearance, no masses or tenderness Abdomen: soft, non-tender; non distended,  no masses,  no organomegaly Extremities: extremities normal, atraumatic, no cyanosis or edema Skin: Skin color, texture, turgor normal. No rashes or lesions Lymph nodes: Cervical, supraclavicular, and axillary nodes normal. No abnormal inguinal nodes palpated Neurologic: Grossly normal   Pelvic: External genitalia:  no lesions              Urethra:  normal appearing urethra with no masses, tenderness or lesions              Bartholins and Skenes: normal                 Vagina: mildly atrophic appearing vagina with normal color, no discharge noted              Cervix: nabothian cyst and no lesions               Bimanual Exam:  Uterus:  normal size, contour, position, consistency, mobility, non-tender              Adnexa: no mass, fullness, tenderness               Rectovaginal: Confirms               Anus:  normal sphincter tone, no lesions  Gae Dry chaperoned for the exam.  1. Encounter for gynecological examination without abnormal finding Discussed breast self exam Mammogram and colonoscopy UTD Labs with primary  2. Screening for cervical cancer Will check pap today, if normal no  further paps - Cytology - PAP  3. Vaginal atrophy Mild, no baseline  discomfort  4. Vaginal discharge Normal exam - WET PREP FOR [place], YEAST, CLUE: negative -patient reassured  5. Female dyspareunia Discussed lubrication She should control rate and depth of penetration She will let me know if she wants to restart vaginal estrogen  6. Osteoporosis, unspecified osteoporosis type, unspecified pathological fracture presence DEXA due next year, will order next year Getting Prolia with Rheumatology Discussed calcium and vit D intake Discussed working on balance and decreasing her risk of falls  In addition to the breast and pelvic exam, over 20 minutes was spent in counseling on dyspareunia, vaginal atrophy, and osteoporosis

## 2021-05-26 ENCOUNTER — Other Ambulatory Visit (HOSPITAL_COMMUNITY)
Admission: RE | Admit: 2021-05-26 | Discharge: 2021-05-26 | Disposition: A | Discharge status: Home / Self Care | Payer: Medicare PPO | Source: Ambulatory Visit | Attending: Obstetrics and Gynecology | Admitting: Obstetrics and Gynecology

## 2021-05-26 ENCOUNTER — Other Ambulatory Visit: Payer: Self-pay

## 2021-05-26 ENCOUNTER — Encounter: Payer: Self-pay | Admitting: Obstetrics and Gynecology

## 2021-05-26 ENCOUNTER — Ambulatory Visit (INDEPENDENT_AMBULATORY_CARE_PROVIDER_SITE_OTHER): Payer: Medicare PPO | Admitting: Obstetrics and Gynecology

## 2021-05-26 VITALS — BP 110/64 | HR 67 | Ht 60.75 in | Wt 108.0 lb

## 2021-05-26 DIAGNOSIS — N952 Postmenopausal atrophic vaginitis: Secondary | ICD-10-CM

## 2021-05-26 DIAGNOSIS — Z01419 Encounter for gynecological examination (general) (routine) without abnormal findings: Secondary | ICD-10-CM | POA: Diagnosis not present

## 2021-05-26 DIAGNOSIS — Z124 Encounter for screening for malignant neoplasm of cervix: Secondary | ICD-10-CM | POA: Insufficient documentation

## 2021-05-26 DIAGNOSIS — M810 Age-related osteoporosis without current pathological fracture: Secondary | ICD-10-CM

## 2021-05-26 DIAGNOSIS — N898 Other specified noninflammatory disorders of vagina: Secondary | ICD-10-CM

## 2021-05-26 DIAGNOSIS — N941 Unspecified dyspareunia: Secondary | ICD-10-CM

## 2021-05-26 LAB — WET PREP FOR TRICH, YEAST, CLUE

## 2021-05-26 NOTE — Patient Instructions (Signed)

## 2021-05-29 ENCOUNTER — Ambulatory Visit (HOSPITAL_COMMUNITY)
Admission: RE | Admit: 2021-05-29 | Discharge: 2021-05-29 | Disposition: A | Discharge status: Home / Self Care | Payer: Medicare PPO | Attending: Ophthalmology | Admitting: Ophthalmology

## 2021-05-29 ENCOUNTER — Encounter (HOSPITAL_COMMUNITY): Payer: Self-pay | Admitting: Ophthalmology

## 2021-05-29 ENCOUNTER — Encounter (HOSPITAL_COMMUNITY)
Admission: RE | Disposition: A | Discharge status: Home / Self Care | Payer: Self-pay | Source: Home / Self Care | Attending: Ophthalmology

## 2021-05-29 ENCOUNTER — Other Ambulatory Visit: Payer: Self-pay

## 2021-05-29 ENCOUNTER — Ambulatory Visit (HOSPITAL_COMMUNITY): Payer: Medicare PPO | Admitting: Anesthesiology

## 2021-05-29 DIAGNOSIS — H25811 Combined forms of age-related cataract, right eye: Secondary | ICD-10-CM | POA: Diagnosis present

## 2021-05-29 DIAGNOSIS — H3530 Unspecified macular degeneration: Secondary | ICD-10-CM | POA: Insufficient documentation

## 2021-05-29 DIAGNOSIS — D649 Anemia, unspecified: Secondary | ICD-10-CM | POA: Insufficient documentation

## 2021-05-29 DIAGNOSIS — H409 Unspecified glaucoma: Secondary | ICD-10-CM | POA: Diagnosis not present

## 2021-05-29 DIAGNOSIS — J45909 Unspecified asthma, uncomplicated: Secondary | ICD-10-CM | POA: Insufficient documentation

## 2021-05-29 DIAGNOSIS — I10 Essential (primary) hypertension: Secondary | ICD-10-CM | POA: Insufficient documentation

## 2021-05-29 DIAGNOSIS — Z9842 Cataract extraction status, left eye: Secondary | ICD-10-CM | POA: Insufficient documentation

## 2021-05-29 HISTORY — PX: CATARACT EXTRACTION W/PHACO: SHX586

## 2021-05-29 LAB — CYTOLOGY - PAP: Diagnosis: NEGATIVE

## 2021-05-29 SURGERY — PHACOEMULSIFICATION, CATARACT, WITH IOL INSERTION
Anesthesia: Monitor Anesthesia Care | Site: Eye | Laterality: Right

## 2021-05-29 MED ORDER — PHENYLEPHRINE HCL 2.5 % OP SOLN
1.0000 [drp] | OPHTHALMIC | Status: AC | PRN
  Administered 2021-05-29 (×3): 1 [drp] via OPHTHALMIC

## 2021-05-29 MED ORDER — SODIUM HYALURONATE 10 MG/ML IO SOLUTION
PREFILLED_SYRINGE | INTRAOCULAR | Status: DC | PRN
  Administered 2021-05-29: 0.85 mL via INTRAOCULAR

## 2021-05-29 MED ORDER — LIDOCAINE HCL 3.5 % OP GEL
1.0000 "application " | Freq: Once | OPHTHALMIC | Status: AC
  Administered 2021-05-29: 1 via OPHTHALMIC

## 2021-05-29 MED ORDER — POVIDONE-IODINE 5 % OP SOLN
OPHTHALMIC | Status: DC | PRN
  Administered 2021-05-29: 1 via OPHTHALMIC

## 2021-05-29 MED ORDER — TROPICAMIDE 1 % OP SOLN
1.0000 [drp] | OPHTHALMIC | Status: AC | PRN
  Administered 2021-05-29 (×3): 1 [drp] via OPHTHALMIC
  Filled 2021-05-29: qty 15

## 2021-05-29 MED ORDER — PHENYLEPHRINE-KETOROLAC 1-0.3 % IO SOLN
INTRAOCULAR | Status: AC
  Filled 2021-05-29: qty 4

## 2021-05-29 MED ORDER — BSS IO SOLN
INTRAOCULAR | Status: DC | PRN
  Administered 2021-05-29: 15 mL via INTRAOCULAR

## 2021-05-29 MED ORDER — EPINEPHRINE PF 1 MG/ML IJ SOLN
INTRAOCULAR | Status: DC | PRN
  Administered 2021-05-29: 500 mL

## 2021-05-29 MED ORDER — SODIUM HYALURONATE 23MG/ML IO SOSY
PREFILLED_SYRINGE | INTRAOCULAR | Status: DC | PRN
  Administered 2021-05-29: 0.6 mL via INTRAOCULAR

## 2021-05-29 MED ORDER — TETRACAINE HCL 0.5 % OP SOLN
1.0000 [drp] | OPHTHALMIC | Status: AC | PRN
  Administered 2021-05-29 (×3): 1 [drp] via OPHTHALMIC

## 2021-05-29 MED ORDER — LIDOCAINE HCL (PF) 1 % IJ SOLN
INTRAOCULAR | Status: DC | PRN
  Administered 2021-05-29: 1 mL via OPHTHALMIC

## 2021-05-29 MED ORDER — STERILE WATER FOR IRRIGATION IR SOLN
Status: DC | PRN
  Administered 2021-05-29: 250 mL

## 2021-05-29 SURGICAL SUPPLY — 14 items
CATARACT SUITE SIGHTPATH (MISCELLANEOUS) ×3 IMPLANT
CLOTH BEACON ORANGE TIMEOUT ST (SAFETY) ×4 IMPLANT
EYE SHIELD UNIVERSAL CLEAR (GAUZE/BANDAGES/DRESSINGS) ×2 IMPLANT
FEE CATARACT SUITE SIGHTPATH (MISCELLANEOUS) ×2 IMPLANT
GLOVE SURG UNDER POLY LF SZ7 (GLOVE) ×4 IMPLANT
LENS IOL RAYNER 23.5 (Intraocular Lens) ×3 IMPLANT
LENS IOL RAYONE EMV 23.5 (Intraocular Lens) IMPLANT
NDL HYPO 18GX1.5 BLUNT FILL (NEEDLE) ×2 IMPLANT
NEEDLE HYPO 18GX1.5 BLUNT FILL (NEEDLE) ×3 IMPLANT
PAD ARMBOARD 7.5X6 YLW CONV (MISCELLANEOUS) ×4 IMPLANT
SYR TB 1ML LL NO SAFETY (SYRINGE) ×4 IMPLANT
TAPE SURG TRANSPORE 1 IN (GAUZE/BANDAGES/DRESSINGS) IMPLANT
TAPE SURGICAL TRANSPORE 1 IN (GAUZE/BANDAGES/DRESSINGS) ×3
WATER STERILE IRR 250ML POUR (IV SOLUTION) ×4 IMPLANT

## 2021-05-29 NOTE — Anesthesia Preprocedure Evaluation (Signed)
Anesthesia Evaluation  Patient identified by MRN, date of birth, ID band Patient awake    Reviewed: Allergy & Precautions, H&P , NPO status , Patient's Chart, lab work & pertinent test results, reviewed documented beta blocker date and time   History of Anesthesia Complications (+) PONV and history of anesthetic complications  Airway Mallampati: II  TM Distance: >3 FB Neck ROM: full    Dental no notable dental hx.    Pulmonary asthma ,    Pulmonary exam normal breath sounds clear to auscultation       Cardiovascular Exercise Tolerance: Good hypertension, negative cardio ROS   Rhythm:regular Rate:Normal     Neuro/Psych negative neurological ROS  negative psych ROS   GI/Hepatic Neg liver ROS, GERD  Medicated,  Endo/Other  negative endocrine ROS  Renal/GU negative Renal ROS  negative genitourinary   Musculoskeletal   Abdominal   Peds  Hematology  (+) Blood dyscrasia, anemia ,   Anesthesia Other Findings   Reproductive/Obstetrics negative OB ROS                             Anesthesia Physical  Anesthesia Plan  ASA: 2  Anesthesia Plan: MAC   Post-op Pain Management: Minimal or no pain anticipated   Induction:   PONV Risk Score and Plan:   Airway Management Planned:   Additional Equipment:   Intra-op Plan:   Post-operative Plan:   Informed Consent: I have reviewed the patients History and Physical, chart, labs and discussed the procedure including the risks, benefits and alternatives for the proposed anesthesia with the patient or authorized representative who has indicated his/her understanding and acceptance.     Dental Advisory Given  Plan Discussed with: CRNA  Anesthesia Plan Comments:         Anesthesia Quick Evaluation

## 2021-05-29 NOTE — Op Note (Signed)
Date of procedure: 05/29/21  Pre-operative diagnosis:  Visually significant combined form age-related cataract, Right Eye (H25.811)  Post-operative diagnosis:  Visually significant combined form age-related cataract, Right Eye (H25.811)  Procedure: Removal of cataract via phacoemulsification and insertion of intra-ocular lens Rayner [id number] +23.5D into the capsular bag of the Right Eye  Attending surgeon: Gerda Diss. Mistee Soliman, MD, MA  Anesthesia: MAC, Topical Ak[place]  Complications: None  Estimated Blood Loss: <24m (minimal)  Specimens: None  Implants: As above  Indications:  Visually significant age-related cataract, Right Eye  Procedure:  The patient was seen and identified in the pre-operative area. The operative eye was identified and dilated.  The operative eye was marked.  Topical anesthesia was administered to the operative eye.     The patient was then to the operative suite and placed in the supine position.  A timeout was performed confirming the patient, procedure to be performed, and all other relevant information.   The patient's face was prepped and draped in the usual fashion for intra-ocular surgery.  A lid speculum was placed into the operative eye and the surgical microscope moved into place and focused.  A superotemporal paracentesis was created using a 20 gauge paracentesis blade.  Shugarcaine was injected into the anterior chamber.  Viscoelastic was injected into the anterior chamber.  A temporal clear-corneal main wound incision was created using a 2.485mmicrokeratome.  A continuous curvilinear capsulorrhexis was initiated using an irrigating cystitome and completed using capsulorrhexis forceps.  Hydrodissection and hydrodeliniation were performed.  Viscoelastic was injected into the anterior chamber.  A phacoemulsification handpiece and a chopper as a second instrument were used to remove the nucleus and epinucleus. The irrigation/aspiration handpiece was used to remove any  remaining cortical material.   The capsular bag was reinflated with viscoelastic, checked, and found to be intact.  The intraocular lens was inserted into the capsular bag.  The irrigation/aspiration handpiece was used to remove any remaining viscoelastic.  The clear corneal wound and paracentesis wounds were then hydrated and checked with Weck-Cels to be watertight.  The lid-speculum was removed.  The drape was removed.  The patient's face was cleaned with a wet and dry 4x4. A clear shield was taped over the eye. The patient was taken to the post-operative care unit in good condition, having tolerated the procedure well.  Post-Op Instructions: [facility] and will receive all other orders and instructions.

## 2021-05-29 NOTE — Anesthesia Postprocedure Evaluation (Signed)
Anesthesia Post Note  Patient: Megan Logan  Procedure(s) Performed: CATARACT EXTRACTION PHACO AND INTRAOCULAR LENS PLACEMENT ([facility]) (Right: Eye)  Patient location during evaluation: Phase II Anesthesia Type: MAC Level of consciousness: awake Pain management: pain level controlled Vital Signs Assessment: post-procedure vital signs reviewed and stable Respiratory status: spontaneous breathing and respiratory function stable Cardiovascular status: blood pressure returned to baseline and stable Postop Assessment: no headache and no apparent nausea or vomiting Anesthetic complications: no Comments: Late entry   No notable events documented.   Last Vitals:  Vitals:   05/29/21 0856 05/29/21 1001  BP: 121/66 118/69  Pulse: 71 67  Resp: 19 18  Temp: 36.6 C 36.6 C  SpO2: 100% 100%    Last Pain:  Vitals:   05/29/21 1001  TempSrc: Oral  PainSc:                  Louann Sjogren

## 2021-05-29 NOTE — Transfer of Care (Signed)
Immediate Anesthesia Transfer of Care Note  Patient: Megan Logan  Procedure(s) Performed: CATARACT EXTRACTION PHACO AND INTRAOCULAR LENS PLACEMENT ([facility]) (Right: Eye)  Patient Location: Short Stay  Anesthesia Type:MAC  Level of Consciousness: awake, alert , oriented and patient cooperative  Airway & Oxygen Therapy: Patient Spontanous Breathing  Post-op Assessment: Report given to RN, Post -op Vital signs reviewed and stable and Patient moving all extremities  Post vital signs: Reviewed and stable  Last Vitals:  Vitals Value Taken Time  BP    Temp    Pulse    Resp    SpO2      Last Pain:  Vitals:   05/29/21 0856  TempSrc: Oral  PainSc: 0-No pain         Complications: No notable events documented.

## 2021-05-29 NOTE — Discharge Instructions (Signed)
Please discharge patient when stable, will follow up today with Dr. Irena Gaydos at the [facility] [place] office immediately following discharge.  Leave shield in place until visit.  All paperwork with discharge instructions will be given at the office.  [facility] [place] Address:  [address]  [place], [place] [address]  

## 2021-05-29 NOTE — Interval H&P Note (Signed)
History and Physical Interval Note:  05/29/2021 9:36 AM  Megan Logan  has presented today for surgery, with the diagnosis of combined forms age related cataract; right eye.  The various methods of treatment have been discussed with the patient and family. After consideration of risks, benefits and other options for treatment, the patient has consented to  Procedure(s) with comments: CATARACT EXTRACTION PHACO AND INTRAOCULAR LENS PLACEMENT ([facility]) (Right) - CDE:  as a surgical intervention.  The patient's history has been reviewed, patient examined, no change in status, stable for surgery.  I have reviewed the patient's chart and labs.  Questions were answered to the patient's satisfaction.     Fabio Pierce

## 2021-05-30 ENCOUNTER — Encounter (HOSPITAL_COMMUNITY): Payer: Self-pay | Admitting: Ophthalmology

## 2021-07-13 ENCOUNTER — Other Ambulatory Visit (HOSPITAL_COMMUNITY): Payer: Self-pay

## 2021-07-13 ENCOUNTER — Telehealth: Payer: Self-pay | Admitting: Pharmacist

## 2021-07-13 DIAGNOSIS — E559 Vitamin D deficiency, unspecified: Secondary | ICD-10-CM

## 2021-07-13 DIAGNOSIS — Z79899 Other long term (current) drug therapy: Secondary | ICD-10-CM

## 2021-07-13 DIAGNOSIS — M810 Age-related osteoporosis without current pathological fracture: Secondary | ICD-10-CM

## 2021-07-13 NOTE — Telephone Encounter (Signed)
Patient due for Prolia on or after 07/31/21. ? ?Per test claim, copay for Prolia through pharmacy benefit is $50. ? ?ATC patient to review lab requirements. Future orders for CBC, CMP, and Vitamin D placed today. If patient has labs completed before appt, patient can receive Prolia at OV with Dr. Corliss Skains on 08/01/21. ? ?Chesley Mires, PharmD, MPH, BCPS ?Clinical Pharmacist (Rheumatology and Pulmonology) ?

## 2021-07-17 ENCOUNTER — Other Ambulatory Visit: Payer: Self-pay | Admitting: *Deleted

## 2021-07-17 DIAGNOSIS — E559 Vitamin D deficiency, unspecified: Secondary | ICD-10-CM

## 2021-07-17 DIAGNOSIS — Z79899 Other long term (current) drug therapy: Secondary | ICD-10-CM

## 2021-07-17 DIAGNOSIS — M810 Age-related osteoporosis without current pathological fracture: Secondary | ICD-10-CM

## 2021-07-17 NOTE — Telephone Encounter (Signed)
ATC patient regarding Prolia labs. Left VM requesting return call to review labs that are needed for her to receive Prolia at appt on 08/01/21 ? ?Chesley Mires, PharmD, MPH, BCPS ?Clinical Pharmacist (Rheumatology and Pulmonology) ?

## 2021-07-18 LAB — COMPREHENSIVE METABOLIC PANEL
AG Ratio: 2.6 (calc) — ABNORMAL HIGH (ref 1.0–2.5)
ALT: 15 U/L (ref 6–29)
AST: 22 U/L (ref 10–35)
Albumin: 4.2 g/dL (ref 3.6–5.1)
Alkaline phosphatase (APISO): 50 U/L (ref 37–153)
BUN: 17 mg/dL (ref 7–25)
CO2: 27 mmol/L (ref 20–32)
Calcium: 9.4 mg/dL (ref 8.6–10.4)
Chloride: 103 mmol/L (ref 98–110)
Creat: 0.71 mg/dL (ref 0.60–1.00)
Globulin: 1.6 g/dL (calc) — ABNORMAL LOW (ref 1.9–3.7)
Glucose, Bld: 125 mg/dL — ABNORMAL HIGH (ref 65–99)
Potassium: 4.6 mmol/L (ref 3.5–5.3)
Sodium: 137 mmol/L (ref 135–146)
Total Bilirubin: 0.7 mg/dL (ref 0.2–1.2)
Total Protein: 5.8 g/dL — ABNORMAL LOW (ref 6.1–8.1)

## 2021-07-18 LAB — CBC WITH DIFFERENTIAL/PLATELET
Absolute Monocytes: 496 cells/uL (ref 200–950)
Basophils Absolute: 48 cells/uL (ref 0–200)
Basophils Relative: 0.6 %
Eosinophils Absolute: 8 cells/uL — ABNORMAL LOW (ref 15–500)
Eosinophils Relative: 0.1 %
HCT: 39.7 % (ref 35.0–45.0)
Hemoglobin: 13 g/dL (ref 11.7–15.5)
Lymphs Abs: 1176 cells/uL (ref 850–3900)
MCH: 31.1 pg (ref 27.0–33.0)
MCHC: 32.7 g/dL (ref 32.0–36.0)
MCV: 95 fL (ref 80.0–100.0)
MPV: 11 fL (ref 7.5–12.5)
Monocytes Relative: 6.2 %
Neutro Abs: 6272 cells/uL (ref 1500–7800)
Neutrophils Relative %: 78.4 %
Platelets: 258 10*3/uL (ref 140–400)
RBC: 4.18 10*6/uL (ref 3.80–5.10)
RDW: 11.8 % (ref 11.0–15.0)
Total Lymphocyte: 14.7 %
WBC: 8 10*3/uL (ref 3.8–10.8)

## 2021-07-18 LAB — VITAMIN D 25 HYDROXY (VIT D DEFICIENCY, FRACTURES): Vit D, 25-Hydroxy: 31 ng/mL (ref 30–100)

## 2021-07-18 NOTE — Progress Notes (Signed)
CBC and CMP normal except glucose is mildly elevated most likely not a fasting sample.  Vitamin D is low normal.  She should take vitamin D 2000 units daily on a regular basis.

## 2021-07-18 NOTE — Progress Notes (Signed)
? ?Office Visit Note ? ?Patient: Megan Logan             ?Date of Birth: August 08, 1940           ?MRN: [id number]             ?PCP: Barbie Banner, MD ?Referring: Barbie Banner, MD ?Visit Date: 08/01/2021 ?Occupation: @GUAROCC @ ? ?Subjective:  ?Pain in multiple joints and medication management ? ?History of Present Illness: Megan Logan is a 81 y.o. female with history of osteoporosis and osteoarthritis.  Patient states that she has been getting recurrent sinus infections over the last 4 months.  She is currently on Biaxin for the sinus infection.  She continues to have discomfort in her bilateral hands, bilateral knee joints and cervical spine.  She sees Dr. 76 for degenerative disc disease of cervical spine.  She states she was taking gabapentin but she came off gabapentin as she was concerned about the side effects.  She has been experiencing increased neck discomfort. ? ?Activities of Daily Living:  ?Patient reports morning stiffness for 5 minutes.   ?Patient Reports nocturnal pain.  ?Difficulty dressing/grooming: Denies ?Difficulty climbing stairs: Denies ?Difficulty getting out of chair: Denies ?Difficulty using hands for taps, buttons, cutlery, and/or writing: Denies ? ?Review of Systems  ?Constitutional:  Positive for fatigue.  ?HENT:  Positive for mouth dryness. Negative for mouth sores and nose dryness.   ?Eyes:  Negative for pain, itching and dryness.  ?Respiratory:  Positive for shortness of breath. Negative for difficulty breathing.   ?Cardiovascular:  Negative for chest pain and palpitations.  ?Gastrointestinal:  Positive for constipation. Negative for blood in stool and diarrhea.  ?Endocrine: Negative for increased urination.  ?Genitourinary:  Negative for difficulty urinating.  ?Musculoskeletal:  Positive for joint pain, joint pain and morning stiffness. Negative for joint swelling, myalgias, muscle tenderness and myalgias.  ?Skin:  Negative for color change, rash, redness and sensitivity to  sunlight.  ?Allergic/Immunologic: Positive for susceptible to infections.  ?Neurological:  Positive for dizziness and headaches. Negative for numbness, memory loss and weakness.  ?Hematological:  Positive for bruising/bleeding tendency.  ?Psychiatric/Behavioral:  Negative for confusion.   ? ?PMFS History:  ?Patient Active Problem List  ? Diagnosis Date Noted  ? Essential hypertension 02/29/2020  ? 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 Volume depletion, gastrointestinal loss 12/04/2019  ? Hyponatremia 12/03/2019  ? Dizziness 12/03/2019  ? Ketonuria 12/03/2019  ? Metabolic acidosis 12/03/2019  ? Chronic rhinitis 02/19/2019  ? Primary osteoarthritis involving multiple joints 02/19/2019  ? DDD (degenerative disc disease), cervical 12/15/2018  ? Dyslipidemia 12/15/2018  ? History of asthma 12/15/2018  ? Elevated hemoglobin A1c 11/14/2017  ? Chronic neck pain 09/19/2016  ? Asthma 07/07/2015  ? Eczema 07/07/2015  ? Gastroesophageal reflux disease without esophagitis 07/07/2015  ? Restless legs syndrome 07/07/2015  ? 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  ? Unspecified vitamin D deficiency 08/06/2013  ? Age-related osteoporosis without current pathological fracture 08/04/2012  ? 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  ? Osteoporosis 08/04/2012  ?  ?Past Medical History:  ?Diagnosis Date  ? Adrenal insufficiency ([facility])   ? Anemia   ? diet related  ? Asthma   ? Atrophic vaginitis   ? Colitis   ? Hx of endometriosis   ? took Danacrine  ? Hypercholesterolemia   ? Hypertension   ? Osteoporosis   ? spine off Actonel sinsce 09/2010- [facility]  ? PONV (postoperative nausea and vomiting)   ? Premature ovarian  failure age 61  ? HRT age 67 - 12/2000  ?  ?Family History  ?Problem Relation Age of Onset  ? Prostate cancer Father 4  ? Alzheimer's disease Mother 51  ? Other Sister   ?     dizziness  ? Cancer Sister 90  ?     Brain and Lung  ? Ulcerative colitis Sister   ? Migraines Sister   ? Cancer - Colon  Maternal Grandfather 108  ? Irritable bowel syndrome Daughter   ? Colitis Daughter   ? ?Past Surgical History:  ?Procedure Laterality Date  ? CATARACT EXTRACTION W/PHACO Left 05/15/2021  ? Procedure: CATARACT EXTRACTION PHACO AND INTRAOCULAR LENS PLACEMENT ([facility]);  Surgeon: Fabio Pierce, MD;  Location: [facility];  Service: Ophthalmology;  Laterality: Left;  CDE: 18.54  ? CATARACT EXTRACTION W/PHACO Right 05/29/2021  ? Procedure: CATARACT EXTRACTION PHACO AND INTRAOCULAR LENS PLACEMENT ([facility]);  Surgeon: Fabio Pierce, MD;  Location: [facility];  Service: Ophthalmology;  Laterality: Right;  CDE: 14.51  ? HYSTEROSCOPY  1996  ? DUB--wnl  ? NASAL SINUS SURGERY  40's and 50's  ? TUBAL LIGATION Bilateral 1982  ? ?Social History  ? ?Social History Narrative  ? Not on file  ? ?Immunization History  ?Administered Date(s) Administered  ? Fluad Quad(high Dose 65+) 01/15/2019  ? Influenza Split 02/24/2017  ? Influenza, High Dose Seasonal PF 02/26/2014, 02/24/2017, 02/24/2018, 01/15/2019, 02/11/2020  ? Influenza,inj,Quad PF,6+ Mos 02/26/2014  ? Influenza,inj,quad, With Preservative 02/04/2017  ? Moderna Sars-Covid-2 Vaccination 05/19/2019, 06/17/2019, 02/29/2020, 08/10/2020  ? Pneumococcal Conjugate-13 05/29/2015  ? Pneumococcal Polysaccharide-23 03/04/2013  ? Tdap 09/29/2015  ? Zoster Recombinat (Shingrix) 08/26/2017, 11/10/2017  ? Zoster, Live 05/07/2007, 08/26/2017, 11/10/2017  ?  ? ?Objective: ?Vital Signs: BP (!) 146/83 (BP Location: Left Arm, Patient Position: Sitting, Cuff Size: Normal)   Pulse 79   Ht 5' 0.5" (1.537 m)   Wt 112 lb 3.2 oz (50.9 kg)   LMP 05/07/1982 (Approximate)   BMI 21.55 kg/m?   ? ?Physical Exam ?Vitals and nursing note reviewed.  ?Constitutional:   ?   Appearance: She is well-developed.  ?HENT:  ?   Head: Normocephalic and atraumatic.  ?Eyes:  ?   Conjunctiva/sclera: Conjunctivae normal.  ?Cardiovascular:  ?   Rate and Rhythm: Normal rate and regular rhythm.  ?   Heart sounds: Normal heart sounds.   ?Pulmonary:  ?   Effort: Pulmonary effort is normal.  ?   Breath sounds: Normal breath sounds.  ?Abdominal:  ?   General: Bowel sounds are normal.  ?   Palpations: Abdomen is soft.  ?Musculoskeletal:  ?   Cervical back: Normal range of motion.  ?Lymphadenopathy:  ?   Cervical: No cervical adenopathy.  ?Skin: ?   General: Skin is warm and dry.  ?   Capillary Refill: Capillary refill takes less than 2 seconds.  ?Neurological:  ?   Mental Status: She is alert and oriented to person, place, and time.  ?Psychiatric:     ?   Behavior: Behavior normal.  ?  ? ?Musculoskeletal Exam: He had limited lateral rotation flexion and extension of her cervical spine.  She had thoracic kyphosis.  Shoulder joints, elbow joints, wrist joints, MCPs PIPs and DIPs with good range of motion.  She had bilateral PIP and DIP thickening and CMC thickening consistent with osteoarthritis.  She has some difficulty with fist formation.  Hip joints and knee joints with good range of motion.  She had no tenderness over ankles or MTPs. ? ?  CDAI Exam: ?CDAI Score: -- ?Patient Global: --; Provider Global: -- ?Swollen: --; Tender: -- ?Joint Exam 08/01/2021  ? ?No joint exam has been documented for this visit  ? ?There is currently no information documented on the homunculus. Go to the Rheumatology activity and complete the homunculus joint exam. ? ?Investigation: ?No additional findings. ? ?Imaging: ?No results found. ? ?Recent Labs: ?Lab Results  ?Component Value Date  ? WBC 8.0 07/17/2021  ? HGB 13.0 07/17/2021  ? PLT 258 07/17/2021  ? NA 137 07/17/2021  ? K 4.6 07/17/2021  ? CL 103 07/17/2021  ? CO2 27 07/17/2021  ? GLUCOSE 125 (H) 07/17/2021  ? BUN 17 07/17/2021  ? CREATININE 0.71 07/17/2021  ? BILITOT 0.7 07/17/2021  ? ALKPHOS 49 12/04/2019  ? AST 22 07/17/2021  ? ALT 15 07/17/2021  ? PROT 5.8 (L) 07/17/2021  ? ALBUMIN 3.3 (L) 12/04/2019  ? CALCIUM 9.4 07/17/2021  ? GFRAA 87 07/14/2020  ? ? ?Speciality Comments: Prolia: 12/24/18, 06/26/19, 12/31/19,  07/14/20, 01/14/21 ? ?Procedures:  ?No procedures performed ?Allergies: Cephalexin, Erythromycin base, Hydrocodone bit-homatrop mbr, Levofloxacin, Penicillins, Sulfamethoxazole-trimethoprim, and Augmentin [amoxicillin-pot

## 2021-07-19 ENCOUNTER — Ambulatory Visit: Payer: Medicare PPO | Admitting: Obstetrics and Gynecology

## 2021-07-20 ENCOUNTER — Other Ambulatory Visit (HOSPITAL_COMMUNITY): Payer: Self-pay

## 2021-07-20 MED ORDER — DENOSUMAB 60 MG/ML ~~LOC~~ SOSY
60.0000 mg | PREFILLED_SYRINGE | SUBCUTANEOUS | 0 refills | Status: DC
  Filled 2021-07-20: qty 1, 180d supply, fill #0

## 2021-07-20 NOTE — Telephone Encounter (Signed)
CBC, CMP, and Vitamin D completed on 07/17/21 - CBC and CMP normal except glucose is mildly elevated most likely not a fasting sample.  Vitamin D is low normal.  She should take vitamin D 2000 units daily on a regular basis. ? ?Rx for Prolia sent to [facility] to be couriered to clinic before appt on 08/01/21. Will plan to administer at her appt with Dr. Corliss Skains on 08/01/21. Patient was unsure if [facility] has payment information on file. Advised her that pharmacy will reach out if they need payment. She is comfortable with $50 copay. ? ?Chesley Mires, PharmD, MPH, BCPS ?Clinical Pharmacist (Rheumatology and Pulmonology) ?

## 2021-07-27 ENCOUNTER — Other Ambulatory Visit (HOSPITAL_COMMUNITY): Payer: Self-pay

## 2021-07-27 NOTE — Telephone Encounter (Signed)
Prolia received in the office and placed in the fridge. Thanks!  °

## 2021-08-01 ENCOUNTER — Ambulatory Visit: Payer: Medicare PPO | Admitting: Rheumatology

## 2021-08-01 ENCOUNTER — Encounter: Payer: Self-pay | Admitting: Rheumatology

## 2021-08-01 ENCOUNTER — Other Ambulatory Visit: Payer: Self-pay

## 2021-08-01 VITALS — BP 146/83 | HR 79 | Ht 60.5 in | Wt 112.2 lb

## 2021-08-01 DIAGNOSIS — E559 Vitamin D deficiency, unspecified: Secondary | ICD-10-CM | POA: Diagnosis not present

## 2021-08-01 DIAGNOSIS — M810 Age-related osteoporosis without current pathological fracture: Secondary | ICD-10-CM

## 2021-08-01 DIAGNOSIS — N952 Postmenopausal atrophic vaginitis: Secondary | ICD-10-CM

## 2021-08-01 DIAGNOSIS — Z8709 Personal history of other diseases of the respiratory system: Secondary | ICD-10-CM

## 2021-08-01 DIAGNOSIS — Z5181 Encounter for therapeutic drug level monitoring: Secondary | ICD-10-CM | POA: Diagnosis not present

## 2021-08-01 DIAGNOSIS — M5030 Other cervical disc degeneration, unspecified cervical region: Secondary | ICD-10-CM

## 2021-08-01 DIAGNOSIS — M1711 Unilateral primary osteoarthritis, right knee: Secondary | ICD-10-CM

## 2021-08-01 DIAGNOSIS — E2740 Unspecified adrenocortical insufficiency: Secondary | ICD-10-CM

## 2021-08-01 DIAGNOSIS — M19041 Primary osteoarthritis, right hand: Secondary | ICD-10-CM | POA: Diagnosis not present

## 2021-08-01 DIAGNOSIS — M19042 Primary osteoarthritis, left hand: Secondary | ICD-10-CM

## 2021-08-01 DIAGNOSIS — Z8719 Personal history of other diseases of the digestive system: Secondary | ICD-10-CM

## 2021-08-01 DIAGNOSIS — E785 Hyperlipidemia, unspecified: Secondary | ICD-10-CM

## 2021-08-01 DIAGNOSIS — E2839 Other primary ovarian failure: Secondary | ICD-10-CM

## 2021-08-01 NOTE — Telephone Encounter (Signed)
Patient was supposed to receive Prolia at [facility] today but currently has sinus infection. She will call back next week to make a Prolia appointment. She is finishing antibiotics on Thursday, 08/03/21 ? ?Chesley Mires, PharmD, MPH, BCPS ?Clinical Pharmacist (Rheumatology and Pulmonology) ?

## 2021-08-08 NOTE — Telephone Encounter (Signed)
Patient called stating she completed antibiotics four days ago and is feeling better.  ? ?Prolia appt scheduled for 08/09/21. ?Prolia is in fridge ? ?Chesley Mires, PharmD, MPH, BCPS ?Clinical Pharmacist (Rheumatology and Pulmonology) ?

## 2021-08-09 ENCOUNTER — Ambulatory Visit (INDEPENDENT_AMBULATORY_CARE_PROVIDER_SITE_OTHER): Payer: Medicare PPO | Admitting: Pharmacist

## 2021-08-09 DIAGNOSIS — M810 Age-related osteoporosis without current pathological fracture: Secondary | ICD-10-CM | POA: Diagnosis not present

## 2021-08-09 DIAGNOSIS — Z7689 Persons encountering health services in other specified circumstances: Secondary | ICD-10-CM | POA: Diagnosis not present

## 2021-08-09 MED ORDER — DENOSUMAB 60 MG/ML ~~LOC~~ SOSY
60.0000 mg | PREFILLED_SYRINGE | Freq: Once | SUBCUTANEOUS | Status: AC
  Administered 2021-08-09: 60 mg via SUBCUTANEOUS
  Filled 2021-08-09: qty 1

## 2021-08-09 NOTE — Progress Notes (Signed)
Pharmacy Note ? ?Subjective:   ?Patient presents to clinic today to receive bi-annual dose of Prolia. She states that she finished her antibiotics for sinus infection 5 days ago. She does not believe she had a sinus infection. She states she has received 7 antibiotic courses in the past year for sinus infections and is now unsure if they have been sinus infection. She has seen ENT recently who reported her sinuses are clear -  no sign of infection. She also states they are doing further workup - will be undergoing testing for possible vertigo/dizziness. ? ?Patient running a fever or have signs/symptoms of infection? No ? ?Patient currently on antibiotics for the treatment of infection? No ? ?Patient had fall in the last 6 months?  No   ? ?Patient taking calcium 1200 mg daily through diet or supplement and at least 800 units vitamin D? Yes ? ?Objective: ?CMP  ?   ?Component Value Date/Time  ? NA 137 07/17/2021 1348  ? NA 141 10/31/2017 1415  ? K 4.6 07/17/2021 1348  ? CL 103 07/17/2021 1348  ? CO2 27 07/17/2021 1348  ? GLUCOSE 125 (H) 07/17/2021 1348  ? BUN 17 07/17/2021 1348  ? BUN 24 10/31/2017 1415  ? CREATININE 0.71 07/17/2021 1348  ? CALCIUM 9.4 07/17/2021 1348  ? PROT 5.8 (L) 07/17/2021 1348  ? PROT 5.9 (L) 10/31/2017 1415  ? ALBUMIN 3.3 (L) 12/04/2019 0254  ? ALBUMIN 4.3 10/31/2017 1415  ? AST 22 07/17/2021 1348  ? ALT 15 07/17/2021 1348  ? ALKPHOS 49 12/04/2019 0605  ? BILITOT 0.7 07/17/2021 1348  ? BILITOT 0.5 10/31/2017 1415  ? GFRNONAA 75 07/14/2020 1031  ? GFRAA 87 07/14/2020 1031  ? ? ?CBC ?   ?Component Value Date/Time  ? WBC 8.0 07/17/2021 1348  ? RBC 4.18 07/17/2021 1348  ? HGB 13.0 07/17/2021 1348  ? HGB 12.0 10/31/2017 1415  ? HGB 12.4 08/06/2013 1128  ? HCT 39.7 07/17/2021 1348  ? HCT 37.6 10/31/2017 1415  ? PLT 258 07/17/2021 1348  ? PLT 292 10/31/2017 1415  ? MCV 95.0 07/17/2021 1348  ? MCV 93 10/31/2017 1415  ? MCH 31.1 07/17/2021 1348  ? MCHC 32.7 07/17/2021 1348  ? RDW 11.8 07/17/2021 1348  ?  RDW 13.5 10/31/2017 1415  ? LYMPHSABS 1,176 07/17/2021 1348  ? EOSABS 8 (L) 07/17/2021 1348  ? BASOSABS 48 07/17/2021 1348  ? ? ?Lab Results  ?Component Value Date  ? VD25OH 31 07/17/2021  ? ? ?T-score:  DEXA 05/04/20: BMD as determined from [facility] L1-L4 (L2,L3) is 0.820 g/cm2 with a T-Score of -2.9 ? ?Assessment/Plan:  ? ?Patient tolerated injection  without issue.  ? ?Administrations This Visit   ? ? denosumab (PROLIA) injection 60 mg   ? ? Admin Date ?08/09/2021 Action ?Given Dose ?60 mg Route ?Subcutaneous Administered By ?Murrell Redden, RPH  ? ?  ?  ? ?  ? ? ?Her DEXA is due 05/04/22. ? ?Patient is to return in 10-14 days for labs to monitor for hypocalcemia.  Future orders placed. ?  ?All questions encouraged and answered.  Instructed patient to call with any further questions or concerns. ? ? ?

## 2021-08-15 ENCOUNTER — Other Ambulatory Visit: Payer: Self-pay | Admitting: Otolaryngology

## 2021-08-15 DIAGNOSIS — H918X9 Other specified hearing loss, unspecified ear: Secondary | ICD-10-CM

## 2021-09-04 ENCOUNTER — Ambulatory Visit
Admission: RE | Admit: 2021-09-04 | Discharge: 2021-09-04 | Disposition: A | Discharge status: Home / Self Care | Payer: Medicare PPO | Source: Ambulatory Visit | Attending: Otolaryngology | Admitting: Otolaryngology

## 2021-09-04 ENCOUNTER — Other Ambulatory Visit (INDEPENDENT_AMBULATORY_CARE_PROVIDER_SITE_OTHER): Payer: Self-pay | Admitting: Otolaryngology

## 2021-09-04 DIAGNOSIS — H918X9 Other specified hearing loss, unspecified ear: Secondary | ICD-10-CM

## 2021-09-04 MED ORDER — GADOBENATE DIMEGLUMINE 529 MG/ML IV SOLN
9.0000 mL | Freq: Once | INTRAVENOUS | Status: AC | PRN
  Administered 2021-09-04: 9 mL via INTRAVENOUS

## 2021-09-05 LAB — POTASSIUM: Potassium: 3.9 mmol/L (ref 3.5–5.2)

## 2021-12-05 ENCOUNTER — Telehealth: Payer: Self-pay | Admitting: Rheumatology

## 2021-12-05 NOTE — Telephone Encounter (Signed)
Returned call to patient. Advised that she must ave listened to an old message. She confirmed this was the case. Nothing further needed.

## 2021-12-05 NOTE — Telephone Encounter (Signed)
I have not reached out to patient.  

## 2021-12-05 NOTE — Telephone Encounter (Signed)
Patient called the office returning Devki's call regarding Prolia labs. Patient requests the orders be sent to [facility] on [address] in [place].

## 2021-12-05 NOTE — Telephone Encounter (Signed)
I did not call patient as she is not due for Prolia until 02/05/22. Sue Lush - please advise if you reached out  Chesley Mires, PharmD, MPH, BCPS, CPP Clinical Pharmacist (Rheumatology and Pulmonology)

## 2022-01-04 ENCOUNTER — Other Ambulatory Visit (HOSPITAL_COMMUNITY): Payer: Self-pay

## 2022-01-09 ENCOUNTER — Other Ambulatory Visit (HOSPITAL_COMMUNITY): Payer: Self-pay

## 2022-01-11 ENCOUNTER — Other Ambulatory Visit (HOSPITAL_COMMUNITY): Payer: Self-pay

## 2022-01-17 ENCOUNTER — Telehealth: Payer: Self-pay | Admitting: Pharmacist

## 2022-01-17 ENCOUNTER — Other Ambulatory Visit (HOSPITAL_COMMUNITY): Payer: Self-pay

## 2022-01-17 DIAGNOSIS — Z79899 Other long term (current) drug therapy: Secondary | ICD-10-CM

## 2022-01-17 DIAGNOSIS — M810 Age-related osteoporosis without current pathological fracture: Secondary | ICD-10-CM

## 2022-01-17 DIAGNOSIS — Z5181 Encounter for therapeutic drug level monitoring: Secondary | ICD-10-CM

## 2022-01-17 DIAGNOSIS — E559 Vitamin D deficiency, unspecified: Secondary | ICD-10-CM

## 2022-01-17 NOTE — Telephone Encounter (Signed)
Patient due for next Prolia on 02/05/22. She has OV on 02/08/22 and can receive at that visit. Copay is $50 through pharmacy benefit  ATC patient regarding labwork. Phone kept ringing. Standing order for CBC, CMP, and Vitamin D placed. MyChart message sent to patient  Chesley Mires, PharmD, MPH, BCPS, CPP Clinical Pharmacist (Rheumatology and Pulmonology)

## 2022-01-19 ENCOUNTER — Telehealth: Payer: Self-pay | Admitting: Rheumatology

## 2022-01-19 DIAGNOSIS — M810 Age-related osteoporosis without current pathological fracture: Secondary | ICD-10-CM

## 2022-01-19 DIAGNOSIS — Z5181 Encounter for therapeutic drug level monitoring: Secondary | ICD-10-CM

## 2022-01-19 DIAGNOSIS — E559 Vitamin D deficiency, unspecified: Secondary | ICD-10-CM

## 2022-01-19 NOTE — Telephone Encounter (Signed)
Lab Orders released.  

## 2022-01-19 NOTE — Telephone Encounter (Signed)
Patient called the office requesting lab orders be sent to [facility] on [address]. Patient is going on Tuesday.

## 2022-01-19 NOTE — Addendum Note (Signed)
Addended by: Henriette Combs on: 01/19/2022 10:57 AM   Modules accepted: Orders

## 2022-01-24 ENCOUNTER — Other Ambulatory Visit (HOSPITAL_COMMUNITY): Payer: Self-pay

## 2022-01-24 LAB — CMP14+EGFR
ALT: 21 IU/L (ref 0–32)
AST: 29 IU/L (ref 0–40)
Albumin/Globulin Ratio: 2.9 — ABNORMAL HIGH (ref 1.2–2.2)
Albumin: 4.3 g/dL (ref 3.8–4.8)
Alkaline Phosphatase: 57 IU/L (ref 44–121)
BUN/Creatinine Ratio: 20 (ref 12–28)
BUN: 18 mg/dL (ref 8–27)
Bilirubin Total: 0.3 mg/dL (ref 0.0–1.2)
CO2: 23 mmol/L (ref 20–29)
Calcium: 9 mg/dL (ref 8.7–10.3)
Chloride: 101 mmol/L (ref 96–106)
Creatinine, Ser: 0.91 mg/dL (ref 0.57–1.00)
Globulin, Total: 1.5 g/dL (ref 1.5–4.5)
Glucose: 104 mg/dL — ABNORMAL HIGH (ref 70–99)
Potassium: 4.7 mmol/L (ref 3.5–5.2)
Sodium: 138 mmol/L (ref 134–144)
Total Protein: 5.8 g/dL — ABNORMAL LOW (ref 6.0–8.5)
eGFR: 64 mL/min/{1.73_m2} (ref >59)

## 2022-01-24 LAB — CBC WITH DIFFERENTIAL/PLATELET
Basophils Absolute: 0 10*3/uL (ref 0.0–0.2)
Basos: 1 %
EOS (ABSOLUTE): 0 10*3/uL (ref 0.0–0.4)
Eos: 0 %
Hematocrit: 35.8 % (ref 34.0–46.6)
Hemoglobin: 11.7 g/dL (ref 11.1–15.9)
Immature Grans (Abs): 0.1 10*3/uL (ref 0.0–0.1)
Immature Granulocytes: 1 %
Lymphocytes Absolute: 1.1 10*3/uL (ref 0.7–3.1)
Lymphs: 14 %
MCH: 30.5 pg (ref 26.6–33.0)
MCHC: 32.7 g/dL (ref 31.5–35.7)
MCV: 93 fL (ref 79–97)
Monocytes Absolute: 0.6 10*3/uL (ref 0.1–0.9)
Monocytes: 7 %
Neutrophils Absolute: 6 10*3/uL (ref 1.4–7.0)
Neutrophils: 77 %
Platelets: 285 10*3/uL (ref 150–450)
RBC: 3.84 x10E6/uL (ref 3.77–5.28)
RDW: 12.2 % (ref 11.7–15.4)
WBC: 7.7 10*3/uL (ref 3.4–10.8)

## 2022-01-24 LAB — VITAMIN D 25 HYDROXY (VIT D DEFICIENCY, FRACTURES): Vit D, 25-Hydroxy: 36.6 ng/mL (ref 30.0–100.0)

## 2022-01-24 MED ORDER — DENOSUMAB 60 MG/ML ~~LOC~~ SOSY
60.0000 mg | PREFILLED_SYRINGE | SUBCUTANEOUS | 0 refills | Status: DC
  Filled 2022-01-24: qty 1, 180d supply, fill #0

## 2022-01-24 NOTE — Telephone Encounter (Signed)
CBC, CMP, Vitamin D from 01/23/22 wnl. Rx for Prolia sent to [facility] to be couriered to clinic by appt on 02/08/22 so she can receive at her OV.  Knox Saliva, PharmD, MPH, BCPS, CPP Clinical Pharmacist (Rheumatology and Pulmonology)

## 2022-01-24 NOTE — Progress Notes (Signed)
CBC, CMP and vitamin D are all within the normal limits.

## 2022-01-25 NOTE — Progress Notes (Signed)
Office Visit Note  Patient: Megan Logan             Date of Birth: [birth year]           MRN: [id number]             PCP: Barbie Banner, MD Referring: Barbie Banner, MD Visit Date: 02/08/2022 Occupation: @GUAROCC @  Subjective:  Pain in joints  History of Present Illness: ADONIS RYTHER is a 81 y.o. left handed female with history of osteoarthritis and osteoporosis.  She has been experiencing pain and discomfort in her bilateral hands more so in the left hand than the right.  She also has discomfort in her right knee joint.  She states her right knee catches intermittently.  She has not noticed any joint swelling.  She has degenerative disc disease of the cervical spine.  She has been experiencing increased neck pain and stiffness.  She is planning to schedule an appointment with Dr. 76.  She has been taking calcium and vitamin D on a regular basis.  She was here to receive her Prolia injection today.  She has not experienced any side effects from Prolia injections.  For the pain management she takes Tylenol on a as needed basis.  Activities of Daily Living:  Patient reports morning stiffness for 1 hour.   Patient Denies nocturnal pain.  Difficulty dressing/grooming: Denies Difficulty climbing stairs: Denies Difficulty getting out of chair: Denies Difficulty using hands for taps, buttons, cutlery, and/or writing: Reports  Review of Systems  Constitutional:  Positive for fatigue.  HENT:  Positive for mouth dryness. Negative for mouth sores.   Eyes:  Negative for dryness.  Respiratory:  Positive for shortness of breath.   Cardiovascular:  Negative for chest pain and palpitations.  Gastrointestinal:  Positive for constipation. Negative for blood in stool and diarrhea.  Endocrine: Negative for increased urination.  Genitourinary:  Negative for involuntary urination.  Musculoskeletal:  Positive for joint pain, joint pain, joint swelling and morning stiffness. Negative for gait  problem, myalgias, muscle weakness, muscle tenderness and myalgias.  Skin:  Negative for color change, rash, hair loss and sensitivity to sunlight.  Allergic/Immunologic: Negative for susceptible to infections.  Neurological:  Positive for dizziness. Negative for headaches.  Hematological:  Negative for swollen glands.  Psychiatric/Behavioral:  Positive for sleep disturbance. Negative for depressed mood. The patient is not nervous/anxious.     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   Volume depletion, gastrointestinal loss 12/04/2019   Hyponatremia 12/03/2019   Dizziness 12/03/2019   Ketonuria 12/03/2019   Metabolic acidosis 12/03/2019   Chronic rhinitis 02/19/2019   Primary osteoarthritis involving multiple joints 02/19/2019   DDD (degenerative disc disease), cervical 12/15/2018   Dyslipidemia 12/15/2018   History of asthma 12/15/2018   Elevated hemoglobin A1c 11/14/2017   Chronic neck pain 09/19/2016   Asthma 07/07/2015   Eczema 07/07/2015   Gastroesophageal reflux disease without esophagitis 07/07/2015   Restless legs syndrome 07/07/2015   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   Unspecified vitamin D deficiency 08/06/2013   Age-related osteoporosis without current pathological fracture 08/04/2012   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   Osteoporosis 08/04/2012    Past Medical History:  Diagnosis Date   Adrenal insufficiency (HCC)    Anemia    diet related   Asthma    Atrophic vaginitis    Colitis  Hx of endometriosis    took Danacrine   Hypercholesterolemia    Hypertension    Osteoporosis    spine off Actonel sinsce 09/2010- [facility]   PONV (postoperative nausea and vomiting)    Premature ovarian failure age 14   HRT age 51 - 12/2000    Family History  Problem Relation Age of Onset   Prostate cancer Father 54   Alzheimer's  disease Mother 52   Other Sister        dizziness   Cancer Sister 27       Brain and Lung   Ulcerative colitis Sister    Migraines Sister    Cancer - Colon Maternal Grandfather 26   Irritable bowel syndrome Daughter    Colitis Daughter    Past Surgical History:  Procedure Laterality Date   CATARACT EXTRACTION W/PHACO Left 05/15/2021   Procedure: CATARACT EXTRACTION PHACO AND INTRAOCULAR LENS PLACEMENT (IOC);  Surgeon: Fabio Pierce, MD;  Location: [facility];  Service: Ophthalmology;  Laterality: Left;  CDE: 18.54   CATARACT EXTRACTION W/PHACO Right 05/29/2021   Procedure: CATARACT EXTRACTION PHACO AND INTRAOCULAR LENS PLACEMENT (IOC);  Surgeon: Fabio Pierce, MD;  Location: [facility];  Service: Ophthalmology;  Laterality: Right;  CDE: 14.51   HYSTEROSCOPY  1996   DUB--wnl   NASAL SINUS SURGERY  40's and 50's   TUBAL LIGATION Bilateral 1982   Social History   Social History Narrative   Not on file   Immunization History  Administered Date(s) Administered   Fluad Quad(high Dose 65+) 01/15/2019   Influenza Split 02/24/2017   Influenza, High Dose Seasonal PF 02/26/2014, 02/24/2017, 02/24/2018, 01/15/2019, 02/11/2020   Influenza,inj,Quad PF,6+ Mos 02/26/2014   Influenza,inj,quad, With Preservative 02/04/2017   Moderna Sars-Covid-2 Vaccination 05/19/2019, 06/17/2019, 02/29/2020, 08/10/2020   Pneumococcal Conjugate-13 05/29/2015   Pneumococcal Polysaccharide-23 03/04/2013   Tdap 09/29/2015   Zoster Recombinat (Shingrix) 08/26/2017, 11/10/2017   Zoster, Live 05/07/2007, 08/26/2017, 11/10/2017     Objective: Vital Signs: BP (!) 147/73 (BP Location: Left Arm, Patient Position: Sitting, Cuff Size: Normal)   Pulse 71   Resp 14   Ht 5\' 1"  (1.549 m)   Wt 107 lb 6.4 oz (48.7 kg)   LMP 05/07/1982 (Approximate)   BMI 20.29 kg/m    Physical Exam Vitals and nursing note reviewed.  Constitutional:      Appearance: She is well-developed.  HENT:     Head: Normocephalic and atraumatic.   Eyes:     Conjunctiva/sclera: Conjunctivae normal.  Cardiovascular:     Rate and Rhythm: Normal rate and regular rhythm.     Heart sounds: Normal heart sounds.  Pulmonary:     Effort: Pulmonary effort is normal.     Breath sounds: Normal breath sounds.  Abdominal:     General: Bowel sounds are normal.     Palpations: Abdomen is soft.  Musculoskeletal:     Cervical back: Normal range of motion.  Lymphadenopathy:     Cervical: No cervical adenopathy.  Skin:    General: Skin is warm and dry.     Capillary Refill: Capillary refill takes less than 2 seconds.  Neurological:     Mental Status: She is alert and oriented to person, place, and time.  Psychiatric:        Behavior: Behavior normal.      Musculoskeletal Exam: She had limited lateral rotation of the cervical spine.  Shoulder joints and elbow joints are in good range of motion.  She had bilateral CMC subluxation.  Bilateral  PIP and DIP thickening was noted.  She had tenderness on palpation over left third PIP joint.  Hip joints and knee joints with good range of motion.  She had discomfort and crepitus with range of motion of her right knee joint.  There was no tenderness over ankles or MTPs.  CDAI Exam: CDAI Score: -- Patient Global: --; Provider Global: -- Swollen: --; Tender: -- Joint Exam 02/08/2022   No joint exam has been documented for this visit   There is currently no information documented on the homunculus. Go to the Rheumatology activity and complete the homunculus joint exam.  Investigation: No additional findings.  Imaging: No results found.  Recent Labs: Lab Results  Component Value Date   WBC 7.7 01/23/2022   HGB 11.7 01/23/2022   PLT 285 01/23/2022   NA 138 01/23/2022   K 4.7 01/23/2022   CL 101 01/23/2022   CO2 23 01/23/2022   GLUCOSE 104 (H) 01/23/2022   BUN 18 01/23/2022   CREATININE 0.91 01/23/2022   BILITOT 0.3 01/23/2022   ALKPHOS 57 01/23/2022   AST 29 01/23/2022   ALT 21  01/23/2022   PROT 5.8 (L) 01/23/2022   ALBUMIN 4.3 01/23/2022   CALCIUM 9.0 01/23/2022   GFRAA 87 07/14/2020    Speciality Comments: Prolia: 12/24/18, 06/26/19, 12/31/19, 07/14/20, 01/14/21  Procedures:  Large Joint Inj: R knee on 02/08/2022 10:26 AM Indications: pain Details: 27 G 1.5 in needle, medial approach  Arthrogram: No  Medications: 40 mg triamcinolone acetonide 40 MG/ML; 1.5 mL lidocaine 1 % Aspirate: 0 mL Outcome: tolerated well, no immediate complications Procedure, treatment alternatives, risks and benefits explained, specific risks discussed. Consent was given by the patient. Immediately prior to procedure a time out was called to verify the correct patient, procedure, equipment, support staff and site/side marked as required. Patient was prepped and draped in the usual sterile fashion.     Allergies: Cephalexin, Erythromycin base, Hydrocodone bit-homatrop mbr, Levofloxacin, Penicillins, Sulfamethoxazole-trimethoprim, and Augmentin [amoxicillin-pot clavulanate]   Assessment / Plan:     Visit Diagnoses: Age-related osteoporosis without current pathological fracture - DEXA 05/04/20: BMD as determined from [facility] L1-L4 (L2,L3) is 0.820 g/cm2 with a T-Score of -2.9-Osteoporosis: -Patient has been on Prolia injections since December 24, 2018.  She received her Prolia shot today which she tolerated well.  She has been taking calcium and vitamin D.  Regular exercise was emphasized.  Plan: denosumab (PROLIA) injection 60 mg  Medication monitoring encounter - Previously on Actonel but discontinued due to severe gastroesophageal reflux. She was started on Prolia on 12/24/2018. last prolia injection: 08/09/2021.  Encounter for medication administration-she received Prolia injection today.  Vitamin D deficiency-vitamin D level was 36.6 on January 23, 2022.  Primary osteoarthritis of both hands-she is due osteoarthritis in her bilateral hands with CMC PIP and DIP thickening and CMC  subluxation bilaterally.  She has been having increased pain and discomfort.  No synovitis was noted.  Joint protection muscle strengthening was discussed.  Primary osteoarthritis of right knee -she has been having increased pain in her right knee joint.  Last cortisone injection was in March 2021 which gave her good relief.  She requests a cortisone injection today.  After side effects were discussed and informed consent was obtained right knee joint was injected with lidocaine and cortisone as described above.  Patient tolerated the procedure well.  Postprocedure instructions were given.  X-rays obtained in 2021 showed moderate osteoarthritis and severe chondromalacia patella.  X-ray findings were reviewed.  A handout on knee joint exercises was given.  DDD (degenerative disc disease), cervical -she has been having increased discomfort in his cervical spine and had limited range of motion.  She sees Dr. Ethelene Halamos.  Adrenal insufficiency (HCC) - She remains on hydrocortisone 10 mg 2 times daily by her endocrinologist.   Dyslipidemia  History of asthma  Postmenopausal atrophic vaginitis  History of gastroesophageal reflux (GERD)  Premature ovarian failure  Orders: Orders Placed This Encounter  Procedures   Large Joint Inj   Meds ordered this encounter  Medications   denosumab (PROLIA) injection 60 mg    Order Specific Question:   Patient is enrolled in [facility] program for this medication and I have provided a copy of the Prolia Medication Guide and Patient Brochure.    Answer:   Yes    Order Specific Question:   I have reviewed with the patient the information in the Prolia Medication Guide and Patient Counseling Chart including the serious risks of Prolia and symptoms of each risk.    Answer:   Yes    Order Specific Question:   I have advised the patient to seek medical attention if they have signs or symptoms of any of the serious risks.    Answer:   Yes     Follow-Up Instructions:  Return in about 6 months (around 08/10/2022) for Osteoarthritis, Osteoporosis.   Pollyann SavoyShaili Therron Sells, MD  Note - This record has been created using Animal nutritionistDragon software.  Chart creation errors have been sought, but may not always  have been located. Such creation errors do not reflect on  the standard of medical care.

## 2022-01-31 ENCOUNTER — Other Ambulatory Visit (HOSPITAL_COMMUNITY): Payer: Self-pay

## 2022-02-01 NOTE — Telephone Encounter (Signed)
Prolia received from [facility]. Placed in refrigerator 

## 2022-02-05 ENCOUNTER — Other Ambulatory Visit (HOSPITAL_COMMUNITY): Payer: Self-pay

## 2022-02-08 ENCOUNTER — Ambulatory Visit: Payer: Medicare PPO | Attending: Rheumatology | Admitting: Rheumatology

## 2022-02-08 ENCOUNTER — Encounter: Payer: Self-pay | Admitting: Rheumatology

## 2022-02-08 VITALS — BP 147/73 | HR 71 | Resp 14 | Ht 61.0 in | Wt 107.4 lb

## 2022-02-08 DIAGNOSIS — E559 Vitamin D deficiency, unspecified: Secondary | ICD-10-CM | POA: Diagnosis not present

## 2022-02-08 DIAGNOSIS — E2839 Other primary ovarian failure: Secondary | ICD-10-CM

## 2022-02-08 DIAGNOSIS — M19041 Primary osteoarthritis, right hand: Secondary | ICD-10-CM

## 2022-02-08 DIAGNOSIS — Z8719 Personal history of other diseases of the digestive system: Secondary | ICD-10-CM

## 2022-02-08 DIAGNOSIS — Z5181 Encounter for therapeutic drug level monitoring: Secondary | ICD-10-CM

## 2022-02-08 DIAGNOSIS — Z7689 Persons encountering health services in other specified circumstances: Secondary | ICD-10-CM

## 2022-02-08 DIAGNOSIS — M810 Age-related osteoporosis without current pathological fracture: Secondary | ICD-10-CM

## 2022-02-08 DIAGNOSIS — M5030 Other cervical disc degeneration, unspecified cervical region: Secondary | ICD-10-CM

## 2022-02-08 DIAGNOSIS — E2740 Unspecified adrenocortical insufficiency: Secondary | ICD-10-CM

## 2022-02-08 DIAGNOSIS — M19042 Primary osteoarthritis, left hand: Secondary | ICD-10-CM

## 2022-02-08 DIAGNOSIS — M1711 Unilateral primary osteoarthritis, right knee: Secondary | ICD-10-CM | POA: Diagnosis not present

## 2022-02-08 DIAGNOSIS — E785 Hyperlipidemia, unspecified: Secondary | ICD-10-CM

## 2022-02-08 DIAGNOSIS — N952 Postmenopausal atrophic vaginitis: Secondary | ICD-10-CM

## 2022-02-08 DIAGNOSIS — Z8709 Personal history of other diseases of the respiratory system: Secondary | ICD-10-CM

## 2022-02-08 MED ORDER — TRIAMCINOLONE ACETONIDE 40 MG/ML IJ SUSP
40.0000 mg | INTRAMUSCULAR | Status: AC | PRN
  Administered 2022-02-08: 40 mg via INTRA_ARTICULAR

## 2022-02-08 MED ORDER — DENOSUMAB 60 MG/ML ~~LOC~~ SOSY
60.0000 mg | PREFILLED_SYRINGE | Freq: Once | SUBCUTANEOUS | Status: AC
  Administered 2022-02-08: 60 mg via SUBCUTANEOUS

## 2022-02-08 MED ORDER — LIDOCAINE HCL 1 % IJ SOLN
1.5000 mL | INTRAMUSCULAR | Status: AC | PRN
  Administered 2022-02-08: 1.5 mL

## 2022-02-08 NOTE — Patient Instructions (Addendum)
Please call your gynecologist to schedule your updated BONE DENSITY AND MAMMOGRAM. You are due to update towards the end of December.  Hand Exercises Hand exercises can be helpful for almost anyone. These exercises can strengthen the hands, improve flexibility and movement, and increase blood flow to the hands. These results can make work and daily tasks easier. Hand exercises can be especially helpful for people who have joint pain from arthritis or have nerve damage from overuse (carpal tunnel syndrome). These exercises can also help people who have injured a hand. Exercises Most of these hand exercises are gentle stretching and motion exercises. It is usually safe to do them often throughout the day. Warming up your hands before exercise may help to reduce stiffness. You can do this with gentle massage or by placing your hands in warm water for 10-15 minutes. It is normal to feel some stretching, pulling, tightness, or mild discomfort as you begin new exercises. This will gradually improve. Stop an exercise right away if you feel sudden, severe pain or your pain gets worse. Ask your health care provider which exercises are best for you. Knuckle bend or "claw" fist  Stand or sit with your arm, hand, and all five fingers pointed straight up. Make sure to keep your wrist straight during the exercise. Gently bend your fingers down toward your palm until the tips of your fingers are touching the top of your palm. Keep your big knuckle straight and just bend the small knuckles in your fingers. Hold this position for __________ seconds. Straighten (extend) your fingers back to the starting position. Repeat this exercise 5-10 times with each hand. Full finger fist  Stand or sit with your arm, hand, and all five fingers pointed straight up. Make sure to keep your wrist straight during the exercise. Gently bend your fingers into your palm until the tips of your fingers are touching the middle of your  palm. Hold this position for __________ seconds. Extend your fingers back to the starting position, stretching every joint fully. Repeat this exercise 5-10 times with each hand. Straight fist Stand or sit with your arm, hand, and all five fingers pointed straight up. Make sure to keep your wrist straight during the exercise. Gently bend your fingers at the big knuckle, where your fingers meet your hand, and the middle knuckle. Keep the knuckle at the tips of your fingers straight and try to touch the bottom of your palm. Hold this position for __________ seconds. Extend your fingers back to the starting position, stretching every joint fully. Repeat this exercise 5-10 times with each hand. Tabletop  Stand or sit with your arm, hand, and all five fingers pointed straight up. Make sure to keep your wrist straight during the exercise. Gently bend your fingers at the big knuckle, where your fingers meet your hand, as far down as you can while keeping the small knuckles in your fingers straight. Think of forming a tabletop with your fingers. Hold this position for __________ seconds. Extend your fingers back to the starting position, stretching every joint fully. Repeat this exercise 5-10 times with each hand. Finger spread  Place your hand flat on a table with your palm facing down. Make sure your wrist stays straight as you do this exercise. Spread your fingers and thumb apart from each other as far as you can until you feel a gentle stretch. Hold this position for __________ seconds. Bring your fingers and thumb tight together again. Hold this position for __________ seconds.  Repeat this exercise 5-10 times with each hand. Making circles  Stand or sit with your arm, hand, and all five fingers pointed straight up. Make sure to keep your wrist straight during the exercise. Make a circle by touching the tip of your thumb to the tip of your index finger. Hold for __________ seconds. Then open  your hand wide. Repeat this motion with your thumb and each finger on your hand. Repeat this exercise 5-10 times with each hand. Thumb motion  Sit with your forearm resting on a table and your wrist straight. Your thumb should be facing up toward the ceiling. Keep your fingers relaxed as you move your thumb. Lift your thumb up as high as you can toward the ceiling. Hold for __________ seconds. Bend your thumb across your palm as far as you can, reaching the tip of your thumb for the small finger (pinkie) side of your palm. Hold for __________ seconds. Repeat this exercise 5-10 times with each hand. Grip strengthening  Hold a stress ball or other soft ball in the middle of your hand. Slowly increase the pressure, squeezing the ball as much as you can without causing pain. Think of bringing the tips of your fingers into the middle of your palm. All of your finger joints should bend when doing this exercise. Hold your squeeze for __________ seconds, then relax. Repeat this exercise 5-10 times with each hand. Contact a health care provider if: Your hand pain or discomfort gets much worse when you do an exercise. Your hand pain or discomfort does not improve within 2 hours after you exercise. If you have any of these problems, stop doing these exercises right away. Do not do them again unless your health care provider says that you can. Get help right away if: You develop sudden, severe hand pain or swelling. If this happens, stop doing these exercises right away. Do not do them again unless your health care provider says that you can. This information is not intended to replace advice given to you by your health care provider. Make sure you discuss any questions you have with your health care provider. Document Revised: 08/11/2020 Document Reviewed: 08/11/2020 Elsevier Patient Education  2023 [employer]  Exercises for Chronic Knee Pain Chronic knee pain is pain that lasts longer than 3  months. For most people with chronic knee pain, exercise and weight loss is an important part of treatment. Your health care provider may want you to focus on: Strengthening the muscles that support your knee. This can take pressure off your knee and lessen pain. Preventing knee stiffness. Maintaining or increasing how far you can move your knee. Losing weight (if this applies) to take pressure off your knee, decrease your risk for injury, and make it easier for you to exercise. Your health care provider will help you develop an exercise program that matches your needs and physical abilities. Below are simple, low-impact exercises you can do at home. Ask your health care provider or a physical therapist how often you should do your exercise program and how many times to repeat each exercise. General safety tips Follow these safety tips for exercising with chronic knee pain: Get your health care provider's approval before doing any exercises. Start slowly and stop any time an exercise causes pain. Do not exercise if your knee pain is flaring up. Warm up first. Stretching a cold muscle can cause an injury. Do 5-10 minutes of easy movement or light stretching before beginning your exercise routine.  Do 5-10 minutes of low-impact activity (like walking or cycling) before starting strengthening exercises. Contact your health care provider any time you have pain during or after exercising. Exercise may cause discomfort but should not be painful. It is normal to be a little stiff or sore after exercising.  Stretching and range-of-motion exercises Front thigh stretch  Stand up straight and support your body by holding on to a chair or resting one hand on a wall. With your legs straight and close together, bend one knee to lift your heel up toward your buttocks. Using one hand for support, grab your ankle with your free hand. Pull your foot up closer toward your buttocks to feel the stretch in front of  your thigh. Hold the stretch for 30 seconds. Repeat __________ times. Complete this exercise __________ times a day. Back thigh stretch  Sit on the floor with your back straight and your legs out straight in front of you. Place the palms of your hands on the floor and slide them toward your feet as you bend at the hip. Try to touch your nose to your knees and feel the stretch in the back of your thighs. Hold for 30 seconds. Repeat __________ times. Complete this exercise __________ times a day. Calf stretch  Stand facing a wall. Place the palms of your hands flat against the wall, arms extended, and lean slightly against the wall. Get into a lunge position with one leg bent at the knee and the other leg stretched out straight behind you. Keep both feet facing the wall and increase the bend in your knee while keeping the heel of the other leg flat on the ground. You should feel the stretch in your calf. Hold for 30 seconds. Repeat __________ times. Complete this exercise __________ times a day. Strengthening exercises Straight leg lift Lie on your back with one knee bent and the other leg out straight. Slowly lift the straight leg without bending the knee. Lift until your foot is about 12 inches (30 cm) off the floor. Hold for 3-5 seconds and slowly lower your leg. Repeat __________ times. Complete this exercise __________ times a day. Single leg dip Stand between two chairs and put both hands on the backs of the chairs for support. Extend one leg out straight with your body weight resting on the heel of the standing leg. Slowly bend your standing knee to dip your body to the level that is comfortable for you. Hold for 3-5 seconds. Repeat __________ times. Complete this exercise __________ times a day. Hamstring curls Stand straight, knees close together, facing the back of a chair. Hold on to the back of a chair with both hands. Keep one leg straight. Bend the other knee while  bringing the heel up toward the buttock until the knee is bent at a 90-degree angle (right angle). Hold for 3-5 seconds. Repeat __________ times. Complete this exercise __________ times a day. Wall squat Stand straight with your back, hips, and head against a wall. Step forward one foot at a time with your back still against the wall. Your feet should be 2 feet (61 cm) from the wall at shoulder width. Keeping your back, hips, and head against the wall, slide down the wall to as close of a sitting position as you can get. Hold for 5-10 seconds, then slowly slide back up. Repeat __________ times. Complete this exercise __________ times a day. Step-ups Step up with one foot onto a sturdy platform or stool that is  about 6 inches (15 cm) high. Face sideways with one foot on the platform and one on the ground. Place all your weight on the platform foot and lift your body off the ground until your knee extends. Let your other leg hang free to the side. Hold for 3-5 seconds then slowly lower your weight down to the floor foot. Repeat __________ times. Complete this exercise __________ times a day. Contact a health care provider if: Your exercise causes pain. Your pain is worse after you exercise. Your pain prevents you from doing your exercises. This information is not intended to replace advice given to you by your health care provider. Make sure you discuss any questions you have with your health care provider. Document Revised: 08/27/2019 Document Reviewed: 04/20/2019 Elsevier Patient Education  2023 [employer].

## 2022-02-08 NOTE — Progress Notes (Signed)
Pharmacy Note  Subjective:   Patient presents to clinic today to receive bi-annual dose of Prolia. Patient's last dose of Prolia was on 08/09/21  Patient running a fever or have signs/symptoms of infection? No  Patient currently on antibiotics for the treatment of infection? No  Patient had fall in the last 6 months?  No    Patient taking calcium 1200 mg daily through diet or supplement and at least 800 units vitamin D? Yes  Objective: CMP     Component Value Date/Time   NA 138 01/23/2022 1512   K 4.7 01/23/2022 1512   CL 101 01/23/2022 1512   CO2 23 01/23/2022 1512   GLUCOSE 104 (H) 01/23/2022 1512   GLUCOSE 125 (H) 07/17/2021 1348   BUN 18 01/23/2022 1512   CREATININE 0.91 01/23/2022 1512   CREATININE 0.71 07/17/2021 1348   CALCIUM 9.0 01/23/2022 1512   PROT 5.8 (L) 01/23/2022 1512   ALBUMIN 4.3 01/23/2022 1512   AST 29 01/23/2022 1512   ALT 21 01/23/2022 1512   ALKPHOS 57 01/23/2022 1512   BILITOT 0.3 01/23/2022 1512   GFRNONAA 75 07/14/2020 1031   GFRAA 87 07/14/2020 1031    CBC    Component Value Date/Time   WBC 7.7 01/23/2022 1513   WBC 8.0 07/17/2021 1348   RBC 3.84 01/23/2022 1513   RBC 4.18 07/17/2021 1348   HGB 11.7 01/23/2022 1513   HGB 12.4 08/06/2013 1128   HCT 35.8 01/23/2022 1513   PLT 285 01/23/2022 1513   MCV 93 01/23/2022 1513   MCH 30.5 01/23/2022 1513   MCH 31.1 07/17/2021 1348   MCHC 32.7 01/23/2022 1513   MCHC 32.7 07/17/2021 1348   RDW 12.2 01/23/2022 1513   LYMPHSABS 1.1 01/23/2022 1513   EOSABS 0.0 01/23/2022 1513   BASOSABS 0.0 01/23/2022 1513    Lab Results  Component Value Date   VD25OH 36.6 01/23/2022    T-score:  DEXA 05/04/20: BMD as determined from AP Spine L1-L4 (L2,L3) is 0.820 g/cm2 with a T-Score of -2.9  Assessment/Plan:   Reviewed importance of adequate dietary intake of calcium in addition to supplementation due to risk of hypocalcemia with Prolia.   Patient tolerated injection without issue.  Reports no issues  in past with Prolia.  Administrations This Visit     denosumab (PROLIA) injection 60 mg     Admin Date 02/08/2022 Action Given Dose 60 mg Route Subcutaneous Administered By Cassandria Anger, RPH-CPP           Patient's next Prolia dose is due on 08/07/2022.  Patient is due for updated DEXA in December 2023. Her gynecologist ordered last DEXA and she will plan to reach out to schedule both DEXA and mammogram   All questions encouraged and answered.  Instructed patient to call with any further questions or concerns.  Knox Saliva, PharmD, MPH, BCPS, CPP Clinical Pharmacist (Rheumatology and Pulmonology)

## 2022-03-23 IMAGING — MR MR HEAD W/O CM
11 series · 48 of 48 positions shown · non-contrast
Comparison: None.

CLINICAL DATA: Headache, nausea, vomiting

EXAM:
MRI HEAD WITHOUT CONTRAST
TECHNIQUE: Multiplanar, multiecho pulse sequences of the brain and surrounding
structures were obtained without intravenous contrast.

[Series 5: DWI · axial · 3.0mm · 1.36mm/px · z∈[-92,+46]mm · 8 of 96 slices shown (1 of 4)]
[im 1/96]
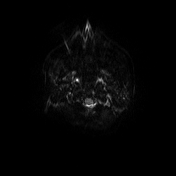
[im 14/96]
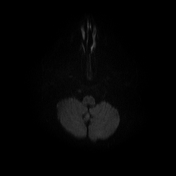
[im 28/96]
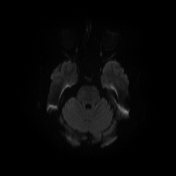
[im 41/96]
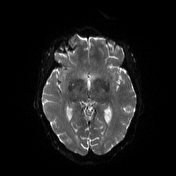
[im 55/96]
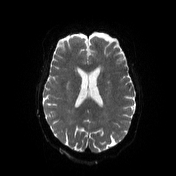
[im 68/96]
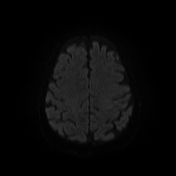
[im 82/96]
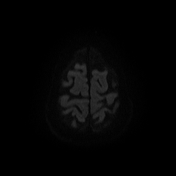
[im 96/96]
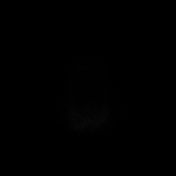

[Series 6: DWI · axial · 3.0mm · 1.36mm/px · z∈[-92,+46]mm · 4 of 48 slices shown (2 of 4)]
[im 1/48]
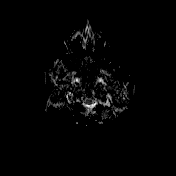
[im 16/48]
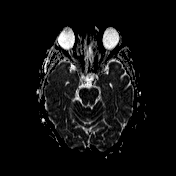
[im 32/48]
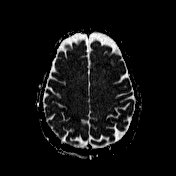
[im 48/48]
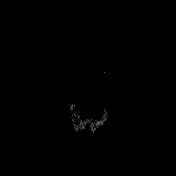

[Series 7: T1 · sagittal · 5.0mm · 0.75mm/px · 2 of 24 slices shown (1 of 2)]
[im 1/24]
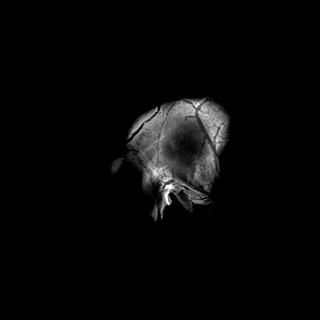
[im 24/24]
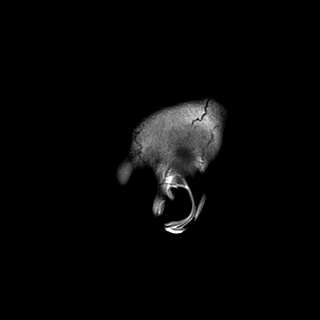

[Series 8: T2 · axial · 5.0mm · 0.62mm/px · z∈[-101,+58]mm · 2 of 26 slices shown (1 of 2)]
[im 1/26]
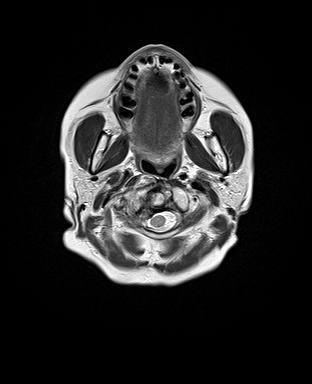
[im 26/26]
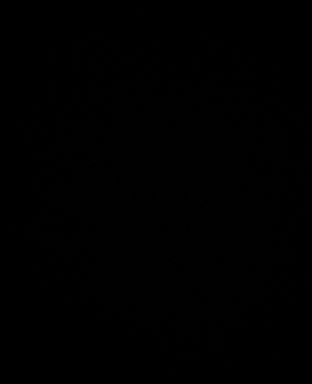

[Series 9: mip_images(sw) · axial · 24.0mm · 0.75mm/px · z∈[-92,+49]mm · 4 of 49 slices shown]
[im 1/49]
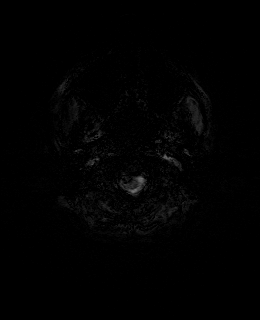
[im 17/49]
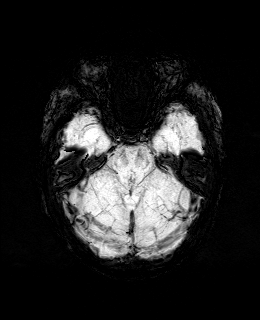
[im 33/49]
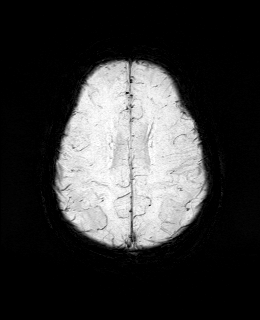
[im 49/49]
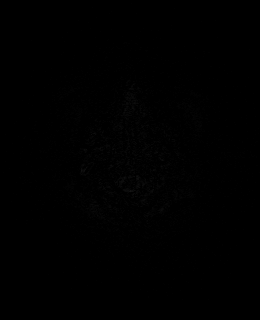

[Series 10: swi_images · axial · 3.0mm · 0.75mm/px · z∈[-102,+59]mm · 4 of 56 slices shown]
[im 1/56]
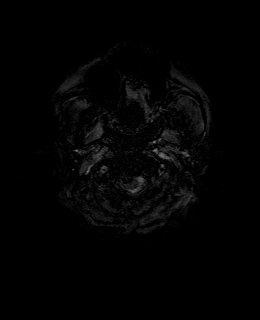
[im 19/56]
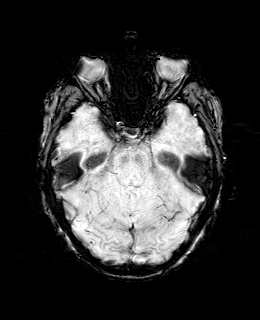
[im 37/56]
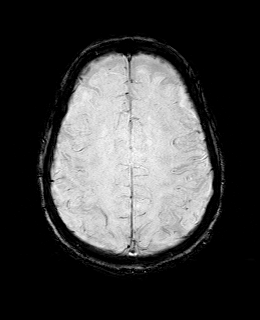
[im 56/56]
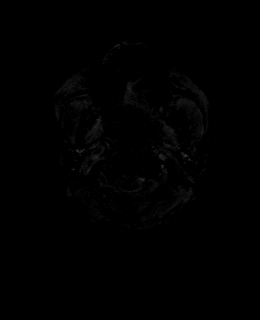

[Series 11: FLAIR · axial · 3.0mm · 0.75mm/px · z∈[-96,+54]mm · 4 of 52 slices shown]
[im 1/52]
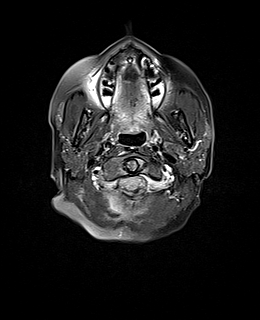
[im 18/52]
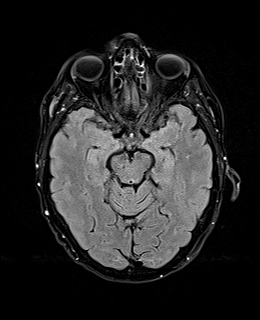
[im 35/52]
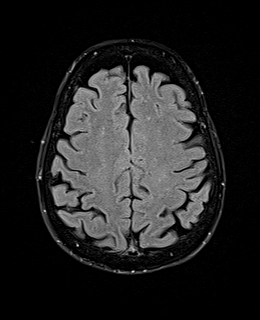
[im 52/52]
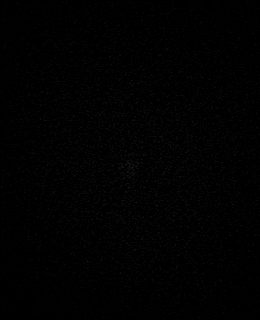

[Series 12: T1 · axial · 1.0mm · 0.94mm/px · z∈[-90,+50]mm · 11 of 144 slices shown (2 of 2)]
[im 1/144]
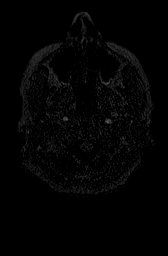
[im 15/144]
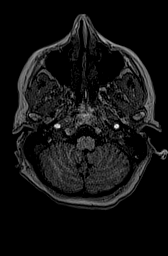
[im 29/144]
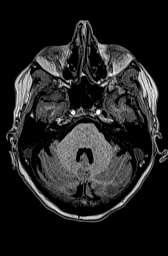
[im 43/144]
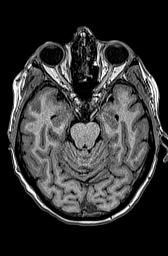
[im 58/144]
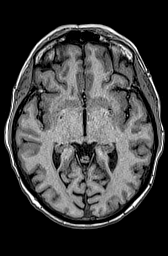
[im 72/144]
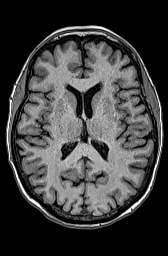
[im 86/144]
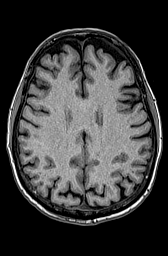
[im 101/144]
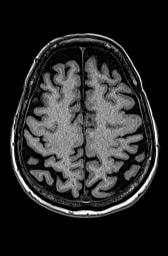
[im 115/144]
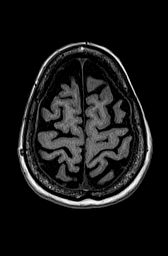
[im 129/144]
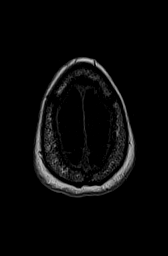
[im 144/144]
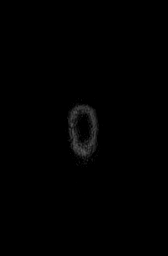

[Series 13: DWI · coronal · 5.0mm · 1.31mm/px · 5 of 64 slices shown (3 of 4)]
[im 1/64]
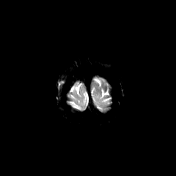
[im 16/64]
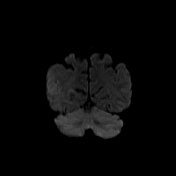
[im 32/64]
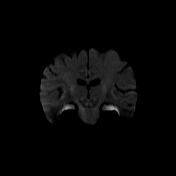
[im 48/64]
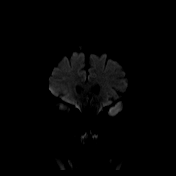
[im 64/64]
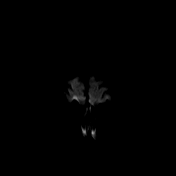

[Series 14: DWI · coronal · 5.0mm · 1.31mm/px · 2 of 32 slices shown (4 of 4)]
[im 1/32]
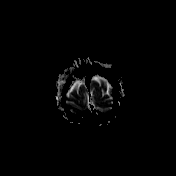
[im 32/32]
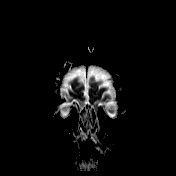

[Series 15: T2 · coronal · 5.0mm · 0.57mm/px · 2 of 32 slices shown (2 of 2)]
[im 1/32]
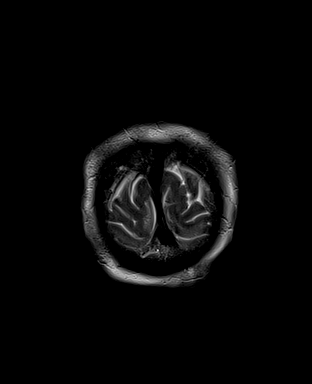
[im 32/32]
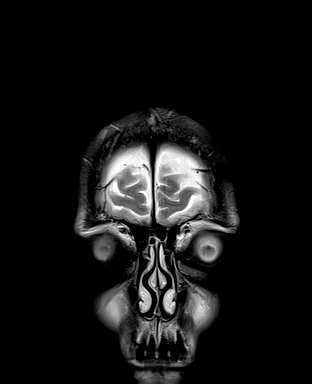

[48 of 48 positions shown; findings below may reference images not displayed]

FINDINGS: Brain: There is no acute infarction or intracranial hemorrhage.
There is no intracranial mass, mass effect, or edema. There is no
hydrocephalus or extra-axial fluid collection. Ventricles and sulci
are within normal limits in size and configuration. Scattered foci
of T2 hyperintensity in the supratentorial white matter are
nonspecific but may reflect mild chronic microvascular ischemic
changes.

Vascular: Major vessel flow voids at the skull base are preserved.

Skull and upper cervical spine: Degenerative changes at C1-C2.
Normal marrow signal is preserved.

Sinuses/Orbits: Minor mucosal thickening.  Orbits are unremarkable.

Other: Sella is unremarkable.  Mastoid air cells are clear.
IMPRESSION: No evidence of recent infarction, hemorrhage, or mass.

Mild chronic microvascular ischemic changes.

## 2022-04-10 ENCOUNTER — Other Ambulatory Visit (HOSPITAL_COMMUNITY): Payer: Self-pay | Admitting: Obstetrics and Gynecology

## 2022-04-10 DIAGNOSIS — Z1231 Encounter for screening mammogram for malignant neoplasm of breast: Secondary | ICD-10-CM

## 2022-04-18 ENCOUNTER — Ambulatory Visit (HOSPITAL_COMMUNITY): Payer: Medicare PPO

## 2022-04-20 ENCOUNTER — Ambulatory Visit (HOSPITAL_COMMUNITY)
Admission: RE | Admit: 2022-04-20 | Discharge: 2022-04-20 | Disposition: A | Discharge status: Home / Self Care | Payer: Medicare PPO | Source: Ambulatory Visit | Attending: Obstetrics and Gynecology | Admitting: Obstetrics and Gynecology

## 2022-04-20 DIAGNOSIS — Z1231 Encounter for screening mammogram for malignant neoplasm of breast: Secondary | ICD-10-CM | POA: Diagnosis present

## 2022-05-05 ENCOUNTER — Emergency Department (HOSPITAL_BASED_OUTPATIENT_CLINIC_OR_DEPARTMENT_OTHER): Payer: Medicare PPO

## 2022-05-05 ENCOUNTER — Encounter (HOSPITAL_BASED_OUTPATIENT_CLINIC_OR_DEPARTMENT_OTHER): Payer: Self-pay

## 2022-05-05 ENCOUNTER — Other Ambulatory Visit (HOSPITAL_BASED_OUTPATIENT_CLINIC_OR_DEPARTMENT_OTHER): Payer: Self-pay

## 2022-05-05 ENCOUNTER — Emergency Department (HOSPITAL_BASED_OUTPATIENT_CLINIC_OR_DEPARTMENT_OTHER)
Admission: EM | Admit: 2022-05-05 | Discharge: 2022-05-05 | Disposition: A | Discharge status: Home / Self Care | Payer: Medicare PPO | Attending: Emergency Medicine | Admitting: Emergency Medicine

## 2022-05-05 DIAGNOSIS — M545 Low back pain, unspecified: Secondary | ICD-10-CM | POA: Diagnosis present

## 2022-05-05 DIAGNOSIS — J45909 Unspecified asthma, uncomplicated: Secondary | ICD-10-CM | POA: Diagnosis not present

## 2022-05-05 DIAGNOSIS — M62830 Muscle spasm of back: Secondary | ICD-10-CM

## 2022-05-05 DIAGNOSIS — X500XXA Overexertion from strenuous movement or load, initial encounter: Secondary | ICD-10-CM | POA: Diagnosis not present

## 2022-05-05 DIAGNOSIS — S32010A Wedge compression fracture of first lumbar vertebra, initial encounter for closed fracture: Secondary | ICD-10-CM | POA: Diagnosis not present

## 2022-05-05 DIAGNOSIS — Z7951 Long term (current) use of inhaled steroids: Secondary | ICD-10-CM | POA: Insufficient documentation

## 2022-05-05 MED ORDER — CYCLOBENZAPRINE HCL 5 MG PO TABS
5.0000 mg | ORAL_TABLET | Freq: Once | ORAL | Status: AC
  Administered 2022-05-05: 5 mg via ORAL
  Filled 2022-05-05: qty 1

## 2022-05-05 MED ORDER — CYCLOBENZAPRINE HCL 5 MG PO TABS
5.0000 mg | ORAL_TABLET | Freq: Two times a day (BID) | ORAL | 0 refills | Status: AC | PRN
  Filled 2022-05-05: qty 14, 7d supply, fill #0

## 2022-05-05 MED ORDER — LIDOCAINE 5 % EX PTCH
1.0000 | MEDICATED_PATCH | CUTANEOUS | 0 refills | Status: AC
  Filled 2022-05-05: qty 15, 15d supply, fill #0

## 2022-05-05 NOTE — ED Triage Notes (Signed)
She c/o low back pain, which is gradually worsening, x 5-6 weeks. She states she is here d/t "I can hardly get out of bed". She is ambulatory and in no distress. Her husband is with her.

## 2022-05-05 NOTE — ED Notes (Signed)
Neurosurg consult called

## 2022-05-05 NOTE — Discharge Instructions (Signed)
Your history, exam, workup today led [place] to get imaging that revealed a 25% inferior endplate compression fracture of the L1 vertebrae.  I suspect this was due to the lifting several weeks ago when the pain began.  He also have muscle spasm on exam.  We spoke to neurosurgery who wants to see you in the next week or 2 in clinic so please call to schedule that appointment.  Please use the muscle relaxant and Lidoderm patches to help with symptoms.  We discussed getting an LSO brace however, due to how long it might take to get that placed today, he would prefer to go home and follow-up with her outpatient team.  If any symptoms change or worsen acutely, please return to the nearest emergency department.

## 2022-05-05 NOTE — ED Provider Notes (Signed)
[facility] EMERGENCY DEPT Provider Note   CSN: [id number] Arrival date & time: 05/05/22  0867     History  Chief Complaint  Patient presents with   Back Pain    ZAHAVA QUANT is a 81 y.o. female.  The history is provided by the patient, the spouse and medical records. No language interpreter was used.  Back Pain Location:  Lumbar spine Quality:  Aching and cramping Radiates to:  Does not radiate Pain severity:  Severe Pain is:  Unable to specify Onset quality:  Gradual Duration:  5 weeks Timing:  Intermittent Progression:  Waxing and waning Chronicity:  New Context: lifting heavy objects (potted plants before pain begian)   Worsened by:  Bending and twisting Ineffective treatments:  None tried Associated symptoms: no abdominal pain, no abdominal swelling, no bladder incontinence, no bowel incontinence, no chest pain, no dysuria, no fever, no headaches, no leg pain, no numbness, no paresthesias, no pelvic pain, no tingling and no weakness   Risk factors: hx of osteoporosis        Home Medications Prior to Admission medications   Medication Sig Start Date End Date Taking? Authorizing Provider  acetaminophen (TYLENOL) 325 MG tablet Take 650 mg by mouth every 6 (six) hours as needed for mild pain or headache.    [provider]  azelastine (ASTELIN) 0.1 % nasal spray Place 1 spray into both nostrils 2 (two) times daily. Use in each nostril as directed    [provider]  BREO ELLIPTA 200-25 MCG/INH AEPB Inhale 1 puff into the lungs daily. 07/02/16   [provider]  calcium carbonate (OS-CAL) 600 MG TABS Take 600 mg by mouth 2 (two) times daily with a meal.    [provider]  cholecalciferol (VITAMIN D) 1000 units tablet Take 2 capsules by mouth daily.     [provider]  denosumab (PROLIA) 60 MG/ML SOSY injection Inject 60 mg into the skin every 6 (six) months. Courier to rheum: [address],  [place] [address]. Appt on 02/08/22 01/24/22 01/24/23  Pollyann Savoy, MD  diclofenac Sodium (VOLTAREN) 1 % GEL Apply 2-4 grams to affected joint 4 times daily as needed. 02/01/21   Gearldine Bienenstock, PA-C  fluticasone Aleda Grana) 50 MCG/ACT nasal spray  03/01/21   [provider]  gabapentin (NEURONTIN) 300 MG capsule Take 300 mg by mouth at bedtime as needed.    [provider]  hydrocortisone (CORTEF) 10 MG tablet Take 10 mg by mouth 2 (two) times daily. 06/21/20   [provider]  losartan (COZAAR) 25 MG tablet Take 1 tablet daily for blood pressure control. Patient not taking: Reported on 02/08/2022 02/25/20   [provider]  ondansetron (ZOFRAN-ODT) 4 MG disintegrating tablet Take 4 mg by mouth every 8 (eight) hours as needed for nausea/vomiting. 11/30/19   [provider]  pramipexole (MIRAPEX) 1 MG tablet Take 1 tablet by mouth at bedtime as needed.  05/31/14   [provider]  pseudoephedrine (SUDAFED) 120 MG 12 hr tablet Take 120 mg by mouth every 12 (twelve) hours as needed for congestion.    [provider]  simvastatin (ZOCOR) 20 MG tablet Take 20 mg by mouth daily at 6 PM.  07/29/17   [provider]  sodium chloride (OCEAN) 0.65 % SOLN nasal spray Place 2 sprays into both nostrils every 4 (four) hours. 12/05/19   Swayze, Ava, DO      Allergies    Cephalexin, Erythromycin base,  Hydrocodone bit-homatrop mbr, Levofloxacin, Penicillins, Sulfamethoxazole-trimethoprim, and Augmentin [amoxicillin-pot clavulanate]    Review of Systems   Review of Systems  Constitutional:  Negative for chills and fever.  HENT:  Negative for congestion.   Respiratory:  Negative for cough, chest tightness, shortness of breath and wheezing.   Cardiovascular:  Negative for chest pain.  Gastrointestinal:  Negative for abdominal pain, bowel incontinence, constipation, diarrhea, nausea and vomiting.  Genitourinary:  Negative for bladder  incontinence, dysuria, flank pain, frequency and pelvic pain.  Musculoskeletal:  Positive for back pain.  Skin:  Negative for rash and wound.  Neurological:  Negative for tingling, weakness, light-headedness, numbness, headaches and paresthesias.  Psychiatric/Behavioral:  Negative for agitation.   All other systems reviewed and are negative.   Physical Exam Updated Vital Signs BP (!) 160/84 (BP Location: Right Arm)   Pulse 86   Temp 98.3 F (36.8 C) (Oral)   Resp 18   Ht 5' 0.75" (1.543 m)   Wt 48.1 kg   LMP 05/07/1982 (Approximate)   SpO2 100%   BMI 20.19 kg/m  Physical Exam Vitals and nursing note reviewed.  Constitutional:      General: She is not in acute distress.    Appearance: She is well-developed. She is not ill-appearing, toxic-appearing or diaphoretic.  HENT:     Head: Normocephalic and atraumatic.  Eyes:     Conjunctiva/sclera: Conjunctivae normal.     Pupils: Pupils are equal, round, and reactive to light.  Cardiovascular:     Rate and Rhythm: Normal rate and regular rhythm.     Heart sounds: No murmur heard. Pulmonary:     Effort: Pulmonary effort is normal. No respiratory distress.     Breath sounds: Normal breath sounds. No wheezing, rhonchi or rales.  Chest:     Chest wall: No tenderness.  Abdominal:     General: Abdomen is flat. There is no distension.     Palpations: Abdomen is soft.     Tenderness: There is no abdominal tenderness. There is no guarding or rebound.  Musculoskeletal:        General: Tenderness present. No swelling.     Cervical back: Neck supple.     Lumbar back: Spasms and tenderness present.       Back:     Right lower leg: No edema.     Left lower leg: No edema.     Comments: Tender to listen and bilateral paraspinal low back, less tender at midline.  Some spasm palpated.  No tenderness in the cervical or thoracic spine.  Skin:    General: Skin is warm and dry.     Capillary Refill: Capillary refill takes less than 2 seconds.      Findings: No erythema.  Neurological:     General: No focal deficit present.     Mental Status: She is alert.     Sensory: No sensory deficit.     Motor: No weakness.  Psychiatric:        Mood and Affect: Mood normal.     ED Results / Procedures / Treatments   Labs (all labs ordered are listed, but only abnormal results are displayed) Labs Reviewed - No data to display  EKG None  Radiology CT Lumbar Spine Wo Contrast  Result Date: 05/05/2022 CLINICAL DATA:  81 year old female with increasing low back pain for 5-6 weeks. Decreased mobility. EXAM: CT LUMBAR SPINE WITHOUT CONTRAST TECHNIQUE: Multidetector CT imaging of the lumbar spine was performed without intravenous contrast administration. Multiplanar CT  image reconstructions were also generated. RADIATION DOSE REDUCTION: This exam was performed according to the departmental dose-optimization program which includes automated exposure control, adjustment of the mA and/or kV according to patient size and/or use of iterative reconstruction technique. COMPARISON:  None Available. FINDINGS: Segmentation: Normal. Alignment: Mildly exaggerated lumbar lordosis with superimposed grade 1 anterolisthesis of L5 on S1. Similar mild retrolisthesis L2 on L3. Underlying mostly dextroconvex lumbar scoliosis. Vertebrae: Generalized osteopenia. L1 inferior endplate compression fracture is un healed (series 5, image 30 and series 3, image 44) with 25% loss of vertebral body height. No retropulsion. L1 posterior elements appear intact and aligned. Visible lower thoracic levels appear grossly intact. Visible posterior ribs appear intact. Lumbar vertebrae L2 through L4 appear intact (chronic L2 anterior superior endplate osteophyte fracture suspected on series 3, image 47). L5 chronic pars fractures. No acute L5 fracture. Osteopenic visible sacrum and SI joints appear intact. Paraspinal and other soft tissues: Moderate gastric hiatal hernia. Mild respiratory  motion at the lung bases which appear clear. Mild Aortoiliac calcified atherosclerosis. Normal caliber abdominal aorta. Grossly negative other visible noncontrast abdominal viscera. Mild L1 paraspinal edema or inflammation at the inferior endplate fracture site. Otherwise paraspinal soft tissues are within normal limits. Disc levels: Superimposed fairly advanced lumbar spine degeneration, including disc space loss and vacuum disc at L2-L3 with mild retrolisthesis there. Chronic pars fractures at L5 with lower lumbar facet arthropathy. But only mild multifactorial lumbar spinal stenosis suspected. IMPRESSION: 1. Acute or subacute L1 inferior endplate compression fracture with unhealed fracture lucency, 25% loss of vertebral body height. No retropulsion or complicating features. 2. Osteopenia. No other acute osseous abnormality identified. 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  Gastric hiatal hernia. Electronically Signed   By: Odessa FlemingH  Hall M.D.   On: 05/05/2022 11:25    Procedures Procedures    Medications Ordered in ED Medications  cyclobenzaprine (FLEXERIL) tablet 5 mg (5 mg Oral Given 05/05/22 1307)    ED Course/ Medical Decision Making/ A&P                           Medical Decision Making Amount and/or Complexity of Data Reviewed Radiology: ordered.  Risk Prescription drug management.    Becky SaxBonnie M Farnan is a 81 y.o. female with a past medical history significant for endometriosis, renal insufficiency, anemia, asthma, and osteoporosis who presents with low back pain.  According to patient, about 5 weeks ago she was lifting some potted plants when she had onset of pain in her low back.  It is across her low back but worse on the right side.  She reports that has waxed and waned for several weeks but over the last 2 days has gotten to the point where she wanted to get assessed.  She has no history of back injury or significant spasms but does have a history of osteoporosis and  is concerned about fracture.  She denies any other trauma.  She denies any numbness, tingling, weakness of her legs.  She denies any loss of bowel or bladder control.  No incontinence reported.  No abdominal pain.  No other fevers, chills, congestion, cough, nausea, vomiting, constipation, diarrhea, or urinary changes.  Denies dysuria or hematuria.  She reports the pain is worsened when she bends or twists and is very tender to palpation.  On exam, lungs clear and chest nontender.  Abdomen nontender.  Back is tender in the paraspinal low back but less tender in the  midline.  She does have some palpable spasms.  Intact sensation strength and pulses lower extremities and she was able to ambulate.  Patient otherwise well-appearing.  No abnormalities on upper extremity exams.  Clinically suspect she has muscle spasms and pain from heavy lifting several weeks ago.  However given the osteoporosis, we will get a CT scan to rule out acute fracture.  If imaging is reassuring, dissipate discharge with a lower dose muscle relaxant and Lidoderm patches and plans to follow-up with a back doctor and PCP.  Anticipate reassessment after workup.  CT scan returned showing evidence of L1 25% compression fracture in the inferior endplate.  I called and spoke with neurosurgery who feels this is appropriate for outpatient follow-up in the next 1 or 2 weeks with Dr. Johnsie Cancel or Dr. Vick Frees.  They did say that an LSO brace could help but patient did not want to wait several hours for it to be fitted and placed.  Will give prescription for Lidoderm patches, lower dose muscle relaxant, and have her follow-up with PCP and neurosurgery.  Patient and family agree with this plan and patient discharged in good condition.         Final Clinical Impression(s) / ED Diagnoses Final diagnoses:  Closed compression fracture of L1 lumbar vertebra, initial encounter (HCC)  Acute bilateral low back pain without sciatica   Muscle spasm of back    Rx / DC Orders ED Discharge Orders          Ordered    cyclobenzaprine (FLEXERIL) 5 MG tablet  2 times daily PRN        05/05/22 1409    lidocaine (LIDODERM) 5 %  Every 24 hours        05/05/22 1409            Clinical Impression: 1. Closed compression fracture of L1 lumbar vertebra, initial encounter (HCC)   2. Acute bilateral low back pain without sciatica   3. Muscle spasm of back     Disposition: Discharge  Condition: Good  I have discussed the results, Dx and Tx plan with the pt(& family if present). He/she/they expressed understanding and agree(s) with the plan. Discharge instructions discussed at great length. Strict return precautions discussed and pt &/or family have verbalized understanding of the instructions. No further questions at time of discharge.    New Prescriptions   CYCLOBENZAPRINE (FLEXERIL) 5 MG TABLET    Take 1 tablet (5 mg total) by mouth 2 (two) times daily as needed for muscle spasms.   LIDOCAINE (LIDODERM) 5 %    Place 1 patch onto the skin daily. Remove & Discard patch within 12 hours or as directed by MD    Follow Up: Jadene Pierini, MD [address] [place] [address] [phone]   with neurosurgery  Iran Sizer, PA-C [address], [address] [place] [address] [phone]   with neurosurgery  [facility] [address] [place] [address] [phone]        Geneen Dieter, Canary Brim, MD 05/05/22 [phone]

## 2022-05-05 NOTE — ED Notes (Signed)
Dc instructions reviewed with patient. Patient voiced understanding. Dc with belongings.  °

## 2022-07-17 ENCOUNTER — Other Ambulatory Visit (HOSPITAL_COMMUNITY): Payer: Self-pay

## 2022-07-26 NOTE — Progress Notes (Signed)
Office Visit Note  Patient: Megan Logan             Date of Birth: January 30, 1941           MRN: [id number]             PCP: Christain Sacramento, MD Referring: Christain Sacramento, MD Visit Date: 08/09/2022 Occupation: @GUAROCC @  Subjective:  Discuss treatment options   History of Present Illness: Megan Logan is a 82 y.o. female with history of osteoporosis and osteoarthritis.  Patient was started on Prolia on 12/24/2018.  She was due to have her next Prolia dose administered today on 08/09/2022 but she was advised by her oral surgeon to postpone her next Prolia dose due to upcoming dental implant surgery scheduled on 08/29/2022.  Of note the patient had an L1 compression fracture found on CT in December 2023.  She did not undergo kyphoplasty in hopes that the fracture would heal on its own.  She continues to have significant discomfort in her lower back.  She has started physical therapy twice a week and continues to use a cane to assist with ambulation.  She has difficulty walking prolonged distances and has been taking Tylenol for pain relief.  She is also has a recurrence of right knee joint pain.  She had a right knee joint cortisone injection on 02/08/2022 which provided temporary relief.  She denies any joint swelling at this time.  She has not yet scheduled an updated bone density.   Activities of Daily Living:  Patient reports morning stiffness for 10 minutes.   Patient Reports nocturnal pain.  Difficulty dressing/grooming: Denies Difficulty climbing stairs: Denies Difficulty getting out of chair: Reports Difficulty using hands for taps, buttons, cutlery, and/or writing: Reports  Review of Systems  Constitutional:  Negative for fatigue.  HENT:  Positive for mouth dryness. Negative for mouth sores.   Eyes:  Negative for dryness.  Respiratory:  Negative for shortness of breath.   Cardiovascular:  Negative for chest pain and palpitations.  Gastrointestinal:  Positive for constipation.  Negative for blood in stool and diarrhea.  Endocrine: Negative for increased urination.  Genitourinary:  Negative for involuntary urination.  Musculoskeletal:  Positive for joint pain, gait problem, joint pain, joint swelling, myalgias, muscle weakness, morning stiffness and myalgias. Negative for muscle tenderness.  Skin:  Positive for hair loss. Negative for color change, rash and sensitivity to sunlight.  Allergic/Immunologic: Positive for susceptible to infections.  Neurological:  Positive for dizziness. Negative for headaches.  Hematological:  Negative for swollen glands.  Psychiatric/Behavioral:  Positive for sleep disturbance. Negative for depressed mood. The patient is nervous/anxious.     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   Volume depletion, gastrointestinal loss 12/04/2019   Hyponatremia 12/03/2019   Dizziness 12/03/2019   Ketonuria 123456   Metabolic acidosis 123456   Chronic rhinitis 02/19/2019   Primary osteoarthritis involving multiple joints 02/19/2019   DDD (degenerative disc disease), cervical 12/15/2018   Dyslipidemia 12/15/2018   History of asthma 12/15/2018   Elevated hemoglobin A1c 11/14/2017   Chronic neck pain 09/19/2016   Asthma 07/07/2015   Eczema 07/07/2015   Gastroesophageal reflux disease without esophagitis 07/07/2015   Restless legs syndrome 07/07/2015   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   Unspecified vitamin D deficiency 08/06/2013   Age-related osteoporosis without current pathological fracture 08/04/2012   Postmenopausal atrophic vaginitis  08/04/2012   Osteoporosis 08/04/2012    Past Medical History:  Diagnosis Date   Adrenal insufficiency    Anemia    diet related   Asthma    Atrophic vaginitis    Colitis    Fracture of L1 vertebra    Hx of endometriosis    took Danacrine   Hypercholesterolemia     Hypertension    Osteoporosis    spine off Actonel sinsce 09/2010- Drug [facility]   PONV (postoperative nausea and vomiting)    Premature ovarian failure age 37   HRT age 34 - 12/2000    Family History  Problem Relation Age of Onset   Prostate cancer Father 35   Alzheimer's disease Mother 10   Other Sister        dizziness   Cancer Sister 32       Brain and Lung   Ulcerative colitis Sister    Migraines Sister    Cancer - Colon Maternal Grandfather 51   Irritable bowel syndrome Daughter    Colitis Daughter    Past Surgical History:  Procedure Laterality Date   CATARACT EXTRACTION W/PHACO Left 05/15/2021   Procedure: CATARACT EXTRACTION PHACO AND INTRAOCULAR LENS PLACEMENT ([place]);  Surgeon: Baruch Goldmann, MD;  Location: [facility];  Service: Ophthalmology;  Laterality: Left;  CDE: 18.54   CATARACT EXTRACTION W/PHACO Right 05/29/2021   Procedure: CATARACT EXTRACTION PHACO AND INTRAOCULAR LENS PLACEMENT ([facility]);  Surgeon: Baruch Goldmann, MD;  Location: [facility];  Service: Ophthalmology;  Laterality: Right;  CDE: 14.51   HYSTEROSCOPY  1996   DUB--wnl   NASAL SINUS SURGERY  40's and 50's   TUBAL LIGATION Bilateral 1982   Social History   Social History Narrative   Not on file   Immunization History  Administered Date(s) Administered   Covid-19, Mrna,Vaccine(Spikevax)66yrs and older 01/28/2022   Fluad Quad(high Dose 65+) 01/15/2019   Influenza Split 02/24/2017   Influenza, High Dose Seasonal PF 02/26/2014, 02/24/2017, 02/24/2018, 01/15/2019, 02/11/2020   Influenza,inj,Quad PF,6+ Mos 02/26/2014   Influenza,inj,quad, With Preservative 02/04/2017   Moderna Sars-Covid-2 Vaccination 05/19/2019, 06/17/2019, 02/29/2020, 08/10/2020   Pfizer Covid-19 Vaccine Bivalent Booster 25yrs & up 09/12/2021   Pneumococcal Conjugate-13 05/29/2015   Pneumococcal Polysaccharide-23 03/04/2013   Tdap 09/29/2015   Zoster Recombinat (Shingrix) 08/26/2017, 11/10/2017   Zoster, Live 05/07/2007, 08/26/2017, 11/10/2017      Objective: Vital Signs: BP (!) 168/81 (BP Location: Left Arm, Patient Position: Sitting, Cuff Size: Small)   Pulse 73   Resp 12   Ht 5' (1.524 m)   Wt 107 lb (48.5 kg)   LMP 05/07/1982 (Approximate)   BMI 20.90 kg/m    Physical Exam Vitals and nursing note reviewed.  Constitutional:      Appearance: She is well-developed.  HENT:     Head: Normocephalic and atraumatic.  Eyes:     Conjunctiva/sclera: Conjunctivae normal.  Cardiovascular:     Rate and Rhythm: Normal rate and regular rhythm.     Heart sounds: Normal heart sounds.  Pulmonary:     Effort: Pulmonary effort is normal.     Breath sounds: Normal breath sounds.  Abdominal:     General: Bowel sounds are normal.     Palpations: Abdomen is soft.  Musculoskeletal:     Cervical back: Normal range of motion.  Lymphadenopathy:     Cervical: No cervical adenopathy.  Skin:    General: Skin is warm and dry.     Capillary Refill: Capillary refill takes less than 2 seconds.  Neurological:  Mental Status: She is alert and oriented to person, place, and time.  Psychiatric:        Behavior: Behavior normal.      Musculoskeletal Exam: C-spine has limited range of motion with lateral rotation.  Painful range of motion of the lumbar spine.  Shoulder joints, elbow joints, wrist joints, MCPs, PIPs, DIPs have good range of motion with no synovitis.  Bilateral CMC joint thickening and subluxation.  PIP and DIP thickening consistent with osteoarthritis of both hands.  Hip joints have good range of motion with no groin pain.  Knee joints have good range of motion with no warmth or effusion.  Some discomfort with range of motion of the right knee.  Right knee crepitus noted.  Ankle joints have good range of motion with no tenderness or joint swelling.  CDAI Exam: CDAI Score: -- Patient Global: --; Provider Global: -- Swollen: --; Tender: -- Joint Exam 08/09/2022   No joint exam has been documented for this visit   There is  currently no information documented on the homunculus. Go to the Rheumatology activity and complete the homunculus joint exam.  Investigation: No additional findings.  Imaging: No results found.  Recent Labs: Lab Results  Component Value Date   WBC 7.7 01/23/2022   HGB 11.7 01/23/2022   PLT 285 01/23/2022   NA 138 07/31/2022   K 4.4 07/31/2022   CL 102 07/31/2022   CO2 29 07/31/2022   GLUCOSE 100 (H) 07/31/2022   BUN 23 07/31/2022   CREATININE 0.73 07/31/2022   BILITOT 0.5 07/31/2022   ALKPHOS 57 01/23/2022   AST 21 07/31/2022   ALT 15 07/31/2022   PROT 6.1 07/31/2022   ALBUMIN 4.3 01/23/2022   CALCIUM 9.4 07/31/2022   GFRAA 87 07/14/2020    Speciality Comments: Prolia: 12/24/18, 06/26/19, 12/31/19, 07/14/20, 01/14/21  Procedures:  No procedures performed Allergies: Cephalexin, Erythromycin base, Hydrocodone bit-homatrop mbr, Levofloxacin, Penicillins, Sulfamethoxazole-trimethoprim, and Augmentin [amoxicillin-pot clavulanate]   Assessment / Plan:     Visit Diagnoses: Age-related osteoporosis without current pathological fracture - DEXA 05/04/20: BMD as determined from [facility] L1-L4 (L2,L3) is 0.820 g/cm2 with a T-Score of -2.9-Osteoporosis.  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 History of GERD. Previous therapy includes Actonel --discontinued due to severe GERD.   Prolia was initiated on 12/24/2018--no side effects or interruptions in therapy thus far.  Of note the patient was found to have an L1 compression fracture on lumbar CT from 05/05/2022.   She was supposed to have her next Prolia injection administered today on 08/09/2022 but unfortunately she has to undergo a dental implant on 08/29/2022 by oral surgery.  Her oral surgeon recommended postponing her next Prolia dose for now.  She is already paid for her Prolia injection so once cleared we plan on administering Prolia pending lab results.  Today we discussed more aggressive treatment  options to help better manage her osteoporosis and prevent further fractures.  Discussed the indications, contraindications, and potential side effects of Evenity versus Forteo.  She was given informational handouts about both medications to review.  She will notify us with which medication she feels more comfortable with at which time we will apply through her insurance.  She would likely benefit from combination therapy.  She is overdue to update her bone density.  A future order was placed today.  Encourage patient to notify us once she has been cleared by her oral surgeon to resume Prolia as well as to notify us  when she has made a decision about the next [place] treatment.  She was encouraged to try not to postpone therapy if possible.  She plans on scheduling her bone density as soon as possible.- Plan: DG BONE DENSITY (DXA)  Medication monitoring encounter - Previously on Actonel but discontinued due to severe gastroesophageal reflux. She was started on Prolia on 12/24/2018. Plan on proceeding with her next Prolia dose once cleared by oral surgery.  Also plan on applying for Evenity or Forteo once the patient has made a decision.  Vitamin D deficiency: Vitamin D was 36.6 on 01/23/2022.  Plan on updating lab work prior to scheduling her next Prolia injection.  Primary osteoarthritis of both hands: CMC joint thickening and subluxation bilaterally.  PIP and DIP thickening consistent with osteoarthritis of both hands.  No tenderness or inflammation noted.  Primary osteoarthritis of right knee: She continues to experience intermittent discomfort in her right knee joint.  She had a right knee joint cortisone injection on 02/08/2022 which righted temporary relief.  She has been using a cane to assist with ambulation.  Discouraged the use of cortisone and prednisone given her history  DDD (degenerative disc disease), cervical - Dr. Ramos-Limited range of motion without rotation.  History of vertebral  fracture: CT of the lumbar spine 05/05/2022: Acute or subacute L1 inferior endplate compression fracture with unhealed fracture lucency, 25% loss of vertebral body height.  Patient did not undergo a kyphoplasty due to the concern for undergoing a procedure and hoping that the fracture would heal on its own quickly.  She has continued to have persistent discomfort which has been severe at times especially if walking or standing for prolonged periods of time.  She has been using a cane to help assist with ambulation.  She has started physical therapy which she plans on continuing twice weekly. Different treatment options for management of osteoporosis were discussed today.  She has been on Prolia since August 2020.  She currently has to postpone her next Prolia dose until cleared by her oral surgeon.  She will be undergoing a dental implant on 08/29/2022.  Recommended starting on Forteo versus Evenity today.  Indications, contraindications, and potential side effects of both medications were reviewed today in detail.  She would like to review both medications prior to making a decision.  Discussed with Dr. Kevin Fenton scheduling her next Prolia injection once cleared by oral surgeon as well as proceeding with applying for either Forteo or Evenity per patient's preference.  The patient will notify us once she has made a decision at which time we will apply through her insurance.  Other medical conditions are listed as follows:  Adrenal insufficiency: She remains on hydrocortisone 10 mg twice daily.  Dyslipidemia  History of asthma  History of gastroesophageal reflux (GERD)  Premature ovarian failure  Orders: Orders Placed This Encounter  Procedures   DG BONE DENSITY (DXA)   No orders of the defined types were placed in this encounter.    Follow-Up Instructions: Return in about 3 months (around 11/08/2022) for Osteoporosis, Osteoarthritis.   Ofilia Neas, PA-C  Note - This record  has been created using Dragon software.  Chart creation errors have been sought, but may not always  have been located. Such creation errors do not reflect on  the standard of medical care.

## 2022-07-31 ENCOUNTER — Other Ambulatory Visit: Payer: Self-pay | Admitting: *Deleted

## 2022-07-31 ENCOUNTER — Telehealth: Payer: Self-pay | Admitting: Pharmacist

## 2022-07-31 ENCOUNTER — Other Ambulatory Visit: Payer: Self-pay

## 2022-07-31 ENCOUNTER — Other Ambulatory Visit (HOSPITAL_COMMUNITY): Payer: Self-pay

## 2022-07-31 DIAGNOSIS — M810 Age-related osteoporosis without current pathological fracture: Secondary | ICD-10-CM

## 2022-07-31 DIAGNOSIS — Z79899 Other long term (current) drug therapy: Secondary | ICD-10-CM

## 2022-07-31 DIAGNOSIS — Z5181 Encounter for therapeutic drug level monitoring: Secondary | ICD-10-CM

## 2022-07-31 DIAGNOSIS — E559 Vitamin D deficiency, unspecified: Secondary | ICD-10-CM

## 2022-07-31 MED ORDER — DENOSUMAB 60 MG/ML ~~LOC~~ SOSY
60.0000 mg | PREFILLED_SYRINGE | SUBCUTANEOUS | 0 refills | Status: DC
  Filled 2022-07-31: qty 1, 180d supply, fill #0

## 2022-07-31 NOTE — Telephone Encounter (Signed)
Patient due for next Prolia on 08/07/22. Copay through pharmacy benefit is $50. Will administer at her upcoming [place] on 08/09/2022.  Rx sent to [facility] today to be couriered to clinic before appt.  Knox Saliva, PharmD, MPH, BCPS, CPP Clinical Pharmacist (Rheumatology and Pulmonology)

## 2022-08-01 LAB — COMPREHENSIVE METABOLIC PANEL
AG Ratio: 2.6 (calc) — ABNORMAL HIGH (ref 1.0–2.5)
ALT: 15 U/L (ref 6–29)
AST: 21 U/L (ref 10–35)
Albumin: 4.4 g/dL (ref 3.6–5.1)
Alkaline phosphatase (APISO): 56 U/L (ref 37–153)
BUN: 23 mg/dL (ref 7–25)
CO2: 29 mmol/L (ref 20–32)
Calcium: 9.4 mg/dL (ref 8.6–10.4)
Chloride: 102 mmol/L (ref 98–110)
Creat: 0.73 mg/dL (ref 0.60–0.95)
Globulin: 1.7 g/dL (calc) — ABNORMAL LOW (ref 1.9–3.7)
Glucose, Bld: 100 mg/dL — ABNORMAL HIGH (ref 65–99)
Potassium: 4.4 mmol/L (ref 3.5–5.3)
Sodium: 138 mmol/L (ref 135–146)
Total Bilirubin: 0.5 mg/dL (ref 0.2–1.2)
Total Protein: 6.1 g/dL (ref 6.1–8.1)

## 2022-08-02 ENCOUNTER — Other Ambulatory Visit: Payer: Self-pay

## 2022-08-02 ENCOUNTER — Ambulatory Visit: Payer: Medicare PPO | Attending: Physician Assistant

## 2022-08-02 DIAGNOSIS — M6281 Muscle weakness (generalized): Secondary | ICD-10-CM | POA: Insufficient documentation

## 2022-08-02 DIAGNOSIS — M5459 Other low back pain: Secondary | ICD-10-CM | POA: Insufficient documentation

## 2022-08-02 NOTE — Telephone Encounter (Signed)
Prolia received from [facility]. Placed in fridge  Megan Logan, PharmD, MPH, BCPS, CPP Clinical Pharmacist (Rheumatology and Pulmonology) 

## 2022-08-02 NOTE — Therapy (Signed)
OUTPATIENT PHYSICAL THERAPY THORACOLUMBAR EVALUATION   Patient Name: Megan Logan MRN: [id number] DOB:26-Aug-1940, 82 y.o., female Today's Date: 08/02/2022  END OF SESSION:  PT End of Session - 08/02/22 0820     Visit Number 1    Number of Visits 12    Date for PT Re-Evaluation 10/05/22    PT Start Time 0815    PT Stop Time 0901    PT Time Calculation (min) 46 min    Activity Tolerance Patient tolerated treatment well    Behavior During Therapy [facility] for tasks assessed/performed             Past Medical History:  Diagnosis Date   Adrenal insufficiency ([place])    Anemia    diet related   Asthma    Atrophic vaginitis    Colitis    Hx of endometriosis    took Danacrine   Hypercholesterolemia    Hypertension    Osteoporosis    spine off Actonel sinsce 09/2010- [facility]   PONV (postoperative nausea and vomiting)    Premature ovarian failure age 24   HRT age 18 - 12/2000   Past Surgical History:  Procedure Laterality Date   CATARACT EXTRACTION W/PHACO Left 05/15/2021   Procedure: CATARACT EXTRACTION PHACO AND INTRAOCULAR LENS PLACEMENT ([place]);  Surgeon: Baruch Goldmann, MD;  Location: [facility];  Service: Ophthalmology;  Laterality: Left;  CDE: 18.54   CATARACT EXTRACTION W/PHACO Right 05/29/2021   Procedure: CATARACT EXTRACTION PHACO AND INTRAOCULAR LENS PLACEMENT ([facility]);  Surgeon: Baruch Goldmann, MD;  Location: [facility];  Service: Ophthalmology;  Laterality: Right;  CDE: 14.51   HYSTEROSCOPY  1996   DUB--wnl   NASAL SINUS SURGERY  40's and 50's   TUBAL LIGATION Bilateral 1982   Patient Active Problem List   Diagnosis Date Noted   Essential hypertension 02/29/2020   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   Volume depletion, gastrointestinal loss 12/04/2019   Hyponatremia 12/03/2019   Dizziness 12/03/2019   Ketonuria 123456   Metabolic acidosis 123456   Chronic rhinitis 02/19/2019   Primary osteoarthritis involving multiple joints 02/19/2019    DDD (degenerative disc disease), cervical 12/15/2018   Dyslipidemia 12/15/2018   History of asthma 12/15/2018   Elevated hemoglobin A1c 11/14/2017   Chronic neck pain 09/19/2016   Asthma 07/07/2015   Eczema 07/07/2015   Gastroesophageal reflux disease without esophagitis 07/07/2015   Restless legs syndrome 07/07/2015   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   Unspecified vitamin D deficiency 08/06/2013   Age-related osteoporosis without current pathological fracture 08/04/2012   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   Osteoporosis 08/04/2012   REFERRING PROVIDER: Collene Schlichter, PA-C   REFERRING DIAG: Age-related osteoporosis with current pathological fracture, vertebra(e), subsequent encounter for fracture with routine healing   Rationale for Evaluation and Treatment: Rehabilitation  THERAPY DIAG:  Other low back pain  Muscle weakness (generalized)  ONSET DATE: December 2023  SUBJECTIVE:  SUBJECTIVE STATEMENT: Patient reports that she was working in her yard in December 2023. She tried emptying a large pot and her back has been bothering her since. She had to go to the emergency room because the pain was so bad. She was told that her back may heal on it's own, but it may require surgery.   PERTINENT HISTORY:  HTN, asthma, osteoporosis, and hearing loss  PAIN:  Are you having pain? Yes: NPRS scale: 8/10 Pain location: low back (side varies)  Pain description: constant sharp pain  Aggravating factors: walking and standing (30 minutes at most), sleeping in her bed, bending over, riding in the car for long periods  Relieving factors: sitting  PRECAUTIONS: None  WEIGHT BEARING RESTRICTIONS: No  FALLS:  Has patient fallen in last 6 months?  No, but my balance is not good.    LIVING ENVIRONMENT: Lives with: lives with their spouse Lives in: House/apartment Stairs:  "yes, but I avoid going up stairs" Has following equipment at home: Quad cane large base; began using the cane after her injury.   OCCUPATION: retired  PLOF: Independent  PATIENT GOALS: reduced pain, walk longer, improved balance, return to driving, and be able to do yard work   NEXT MD VISIT: May 2024   OBJECTIVE:   DIAGNOSTIC FINDINGS: 05/05/22 Lumbar CT scan  IMPRESSION: 1. Acute or subacute L1 inferior endplate compression fracture with unhealed fracture lucency, 25% loss of vertebral body height. No retropulsion or complicating features.   2. Osteopenia. No other acute osseous abnormality identified. Chronic L5 pars fractures.   3. Lumbar spine degeneration.  Gastric hiatal hernia.  SCREENING FOR RED FLAGS: Bowel or bladder incontinence: No Spinal tumors: No Cauda equina syndrome: No Compression fracture: No Abdominal aneurysm: No  COGNITION: Overall cognitive status: Within functional limits for tasks assessed     SENSATION: Patient reports no numbness or tingling  POSTURE: rounded shoulders, forward head, decreased lumbar lordosis, and increased thoracic kyphosis  PALPATION: TTP: bilateral QL and obliques  JOINT MOBILITY:   Lumbar: hypomobile with L1-2 reproducing familiar pain  LUMBAR ROM:   AROM eval  Flexion 28  Extension 8; increased pain, and minimal L5-S1 mobility  Right lateral flexion 50% limited  Left lateral flexion 50% limited; familiar pain  Right rotation 50% limited; familiar pain   Left rotation 50% limited; familiar pain   (Blank rows = not tested)  LOWER EXTREMITY ROM: WFL for activities assessed  LOWER EXTREMITY MMT:    MMT Right eval Left eval  Hip flexion 4-/5 3+/5  Hip extension    Hip abduction    Hip adduction    Hip internal rotation    Hip external rotation    Knee flexion 4/5 4/5  Knee extension 4+/5 5/5  Ankle  dorsiflexion 4/5 4/5  Ankle plantarflexion    Ankle inversion    Ankle eversion     (Blank rows = not tested)  FUNCTIONAL TESTS:  5 times sit to stand: 16.06 seconds w/o UE support Timed up and go (TUG): 13.15 seconds w/o assistive device  GAIT: Assistive device utilized: Quad cane large base Level of assistance: Modified independence Comments: decreased gait speed  TODAY'S TREATMENT:  DATE:                                     3/28 EXERCISE LOG  Exercise Repetitions and Resistance Comments  Slouch overcorrect 15 reps    Seated hip ADD isometric  10 reps w/ 5 second hold                Blank cell = exercise not performed today   PATIENT EDUCATION:  Education details: HEP, prognosis, healing, benefits of exercise, and goals for therapy Person educated: Patient Education method: Explanation and Handouts Education comprehension: verbalized understanding and returned demonstration  HOME EXERCISE PROGRAM: [id number]  ASSESSMENT:  CLINICAL IMPRESSION: Patient is a 82 y.o. female who was seen today for physical therapy evaluation and treatment for chronic low back pain secondary to a L1 compression fracture. She presented with high pain severity and  irritability with lumbar active range of motion reproducing her familiar pain. She is also at an elevated fall risk due to her fear of falling and her five time sit to stand time. She was provided a HEP and she was able properly demonstrate these interventions. She reported feeling comfortable with these interventions. She continues to require skilled physical therapy to address her impairments to maximize her safety and functional mobility.   OBJECTIVE IMPAIRMENTS: decreased activity tolerance, decreased balance, decreased mobility, difficulty walking, decreased ROM, decreased strength, hypomobility, impaired tone,  postural dysfunction, and pain.   ACTIVITY LIMITATIONS: carrying, lifting, bending, standing, and locomotion level  PARTICIPATION LIMITATIONS: meal prep, cleaning, laundry, driving, shopping, community activity, and yard work  PERSONAL FACTORS: Age, Time since onset of injury/illness/exacerbation, and 3+ comorbidities: HTN, asthma, osteoporosis, and hearing loss  are also affecting patient's functional outcome.   REHAB POTENTIAL: Good  CLINICAL DECISION MAKING: Evolving/moderate complexity  EVALUATION COMPLEXITY: Moderate   GOALS: Goals reviewed with patient? Yes  SHORT TERM GOALS: Target date: 08/23/22  Patient will be independent with her initial HEP.  Baseline: Goal status: INITIAL  2.  Patient will be able to complete her daily activities without her familiar pain exceeding 6/10. Baseline:  Goal status: INITIAL  3.  Patient be able to demonstrate proper lifting mechanics for improved safety picking up items from the floor. Baseline:  Goal status: INITIAL  LONG TERM GOALS: Target date: 09/13/22  Patient will be independent with her advanced HEP. Baseline:  Goal status: INITIAL  2.  Patient will be able to complete her daily activities without her familiar pain exceeding 4/10. Baseline:  Goal status: INITIAL  3.  Patient will improve her 5 times sit to stand to 13 seconds or less for improved lower extremity power. Baseline:  Goal status: INITIAL  4.  Patient will be able to safely ambulate at least 80 feet without an assistive device for improved household independence. Baseline:  Goal status: INITIAL  5.  Patient will report being able to do her yard work without being limited by her familiar low back pain. Baseline:  Goal status: INITIAL  6.  Patient will be able to walk for at least 45 minutes without being limited by her familiar low back pain. Baseline:  Goal status: INITIAL  PLAN:  PT FREQUENCY: 2x/week  PT DURATION: 6 weeks  PLANNED  INTERVENTIONS: Therapeutic exercises, Therapeutic activity, Neuromuscular re-education, Balance training, Gait training, Patient/Family education, Self Care, Joint mobilization, Stair training, Electrical stimulation, Spinal mobilization, Cryotherapy, Moist heat, Manual therapy, and Re-evaluation.  PLAN FOR NEXT SESSION: nustep, lumbar and lower extremity strengthening, balance interventions, and modalities as needed   Darlin Coco, PT 08/02/2022, 6:10 PM

## 2022-08-06 ENCOUNTER — Ambulatory Visit: Payer: Medicare PPO | Attending: Physician Assistant

## 2022-08-06 ENCOUNTER — Other Ambulatory Visit: Payer: Self-pay

## 2022-08-06 DIAGNOSIS — M6281 Muscle weakness (generalized): Secondary | ICD-10-CM | POA: Insufficient documentation

## 2022-08-06 DIAGNOSIS — M5459 Other low back pain: Secondary | ICD-10-CM

## 2022-08-06 NOTE — Therapy (Signed)
OUTPATIENT PHYSICAL THERAPY THORACOLUMBAR TREATMENT   Patient Name: Megan Logan MRN: [id number] DOB:Nov 03, 1940, 82 y.o., female Today's Date: 08/06/2022  END OF SESSION:  PT End of Session - 08/06/22 0949     Visit Number 2    Number of Visits 12    Date for PT Re-Evaluation 10/05/22    PT Start Time 0945    PT Stop Time 1042    PT Time Calculation (min) 57 min    Activity Tolerance Patient tolerated treatment well    Behavior During Therapy [facility] for tasks assessed/performed             Past Medical History:  Diagnosis Date   Adrenal insufficiency    Anemia    diet related   Asthma    Atrophic vaginitis    Colitis    Hx of endometriosis    took Danacrine   Hypercholesterolemia    Hypertension    Osteoporosis    spine off Actonel sinsce 09/2010- [facility]   PONV (postoperative nausea and vomiting)    Premature ovarian failure age 64   HRT age 26 - 12/2000   Past Surgical History:  Procedure Laterality Date   CATARACT EXTRACTION W/PHACO Left 05/15/2021   Procedure: CATARACT EXTRACTION PHACO AND INTRAOCULAR LENS PLACEMENT ([place]);  Surgeon: Baruch Goldmann, MD;  Location: [facility];  Service: Ophthalmology;  Laterality: Left;  CDE: 18.54   CATARACT EXTRACTION W/PHACO Right 05/29/2021   Procedure: CATARACT EXTRACTION PHACO AND INTRAOCULAR LENS PLACEMENT ([facility]);  Surgeon: Baruch Goldmann, MD;  Location: [facility];  Service: Ophthalmology;  Laterality: Right;  CDE: 14.51   HYSTEROSCOPY  1996   DUB--wnl   NASAL SINUS SURGERY  40's and 50's   TUBAL LIGATION Bilateral 1982   Patient Active Problem List   Diagnosis Date Noted   Essential hypertension 02/29/2020   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   Volume depletion, gastrointestinal loss 12/04/2019   Hyponatremia 12/03/2019   Dizziness 12/03/2019   Ketonuria 123456   Metabolic acidosis 123456   Chronic rhinitis 02/19/2019   Primary osteoarthritis involving multiple joints 02/19/2019   DDD  (degenerative disc disease), cervical 12/15/2018   Dyslipidemia 12/15/2018   History of asthma 12/15/2018   Elevated hemoglobin A1c 11/14/2017   Chronic neck pain 09/19/2016   Asthma 07/07/2015   Eczema 07/07/2015   Gastroesophageal reflux disease without esophagitis 07/07/2015   Restless legs syndrome 07/07/2015   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   Unspecified vitamin D deficiency 08/06/2013   Age-related osteoporosis without current pathological fracture 08/04/2012   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   Osteoporosis 08/04/2012   REFERRING PROVIDER: Collene Schlichter, PA-C   REFERRING DIAG: Age-related osteoporosis with current pathological fracture, vertebra(e), subsequent encounter for fracture with routine healing   Rationale for Evaluation and Treatment: Rehabilitation  THERAPY DIAG:  Other low back pain  Muscle weakness (generalized)  ONSET DATE: December 2023  SUBJECTIVE:  SUBJECTIVE STATEMENT: Pt arrives for today's treatment session reporting 7/10 low back pain when standing.  Reports that pain is greatly relieved by sitting.    PERTINENT HISTORY:  HTN, asthma, osteoporosis, and hearing loss  PAIN:  Are you having pain? Yes: NPRS scale: 7/10 Pain location: low back (side varies)  Pain description: constant sharp pain  Aggravating factors: walking and standing (30 minutes at most), sleeping in her bed, bending over, riding in the car for long periods  Relieving factors: sitting  PRECAUTIONS: None  WEIGHT BEARING RESTRICTIONS: No  FALLS:  Has patient fallen in last 6 months?  No, but my balance is not good.   LIVING ENVIRONMENT: Lives with: lives with their spouse Lives in: House/apartment Stairs:  "yes, but I avoid going up stairs" Has following equipment  at home: Quad cane large base; began using the cane after her injury.   OCCUPATION: retired  PLOF: Independent  PATIENT GOALS: reduced pain, walk longer, improved balance, return to driving, and be able to do yard work   NEXT MD VISIT: May 2024   OBJECTIVE:   DIAGNOSTIC FINDINGS: 05/05/22 Lumbar CT scan  IMPRESSION: 1. Acute or subacute L1 inferior endplate compression fracture with unhealed fracture lucency, 25% loss of vertebral body height. No retropulsion or complicating features.   2. Osteopenia. No other acute osseous abnormality identified. Chronic L5 pars fractures.   3. Lumbar spine degeneration.  Gastric hiatal hernia.  SCREENING FOR RED FLAGS: Bowel or bladder incontinence: No Spinal tumors: No Cauda equina syndrome: No Compression fracture: No Abdominal aneurysm: No  COGNITION: Overall cognitive status: Within functional limits for tasks assessed     SENSATION: Patient reports no numbness or tingling  POSTURE: rounded shoulders, forward head, decreased lumbar lordosis, and increased thoracic kyphosis  PALPATION: TTP: bilateral QL and obliques  JOINT MOBILITY:   Lumbar: hypomobile with L1-2 reproducing familiar pain  LUMBAR ROM:   AROM eval  Flexion 28  Extension 8; increased pain, and minimal L5-S1 mobility  Right lateral flexion 50% limited  Left lateral flexion 50% limited; familiar pain  Right rotation 50% limited; familiar pain   Left rotation 50% limited; familiar pain   (Blank rows = not tested)  LOWER EXTREMITY ROM: WFL for activities assessed  LOWER EXTREMITY MMT:    MMT Right eval Left eval  Hip flexion 4-/5 3+/5  Hip extension    Hip abduction    Hip adduction    Hip internal rotation    Hip external rotation    Knee flexion 4/5 4/5  Knee extension 4+/5 5/5  Ankle dorsiflexion 4/5 4/5  Ankle plantarflexion    Ankle inversion    Ankle eversion     (Blank rows = not tested)  FUNCTIONAL TESTS:  5 times sit to stand: 16.06  seconds w/o UE support Timed up and go (TUG): 13.15 seconds w/o assistive device  GAIT: Assistive device utilized: Quad cane large base Level of assistance: Modified independence Comments: decreased gait speed  TODAY'S TREATMENT:  DATE:                                    4/1 EXERCISE LOG  Exercise Repetitions and Resistance Comments  Nustep Lvl 2 x 15 mins   Ball Press 3 sec hold x 2 mins   Ball Roll Out 3 sec hold x 2 mins   Slouch overcorrect 15 reps    Seated hip ADD isometric  15 reps w/ 5 second hold   LAQs 2# x 20 reps bil   Seated Marches 2# x 20 reps bil        Blank cell = exercise not performed today   Modalities  Date:  Unattended Estim: Lumbar, IFC 80-150 Hz , 15 mins, Pain Hot Pack: Lumbar, 15 mins, Pain and Tone   PATIENT EDUCATION:  Education details: HEP, prognosis, healing, benefits of exercise, and goals for therapy Person educated: Patient Education method: Explanation and Handouts Education comprehension: verbalized understanding and returned demonstration  HOME EXERCISE PROGRAM: [id number]  ASSESSMENT:  CLINICAL IMPRESSION: Pt arrives for today's treatment session reporting 7/10 low back pain.  Pt able to tolerate Nustep for warm up today without issue, but does request pillow behind her back at next visit.  Pt instructed in several standing and seated exercises to assist with low back strength and function.  Pt requiring min cues for proper technique and posture.  Pt requiring verbal and tactile cues to avoid leaning with LAQs and seated marches.  Normal responses to estim and MH noted upon removal.  Pt reports having a TENS unit at home.  Pt reported decreased pain at completion of today's treatment session.   OBJECTIVE IMPAIRMENTS: decreased activity tolerance, decreased balance, decreased mobility, difficulty walking, decreased  ROM, decreased strength, hypomobility, impaired tone, postural dysfunction, and pain.   ACTIVITY LIMITATIONS: carrying, lifting, bending, standing, and locomotion level  PARTICIPATION LIMITATIONS: meal prep, cleaning, laundry, driving, shopping, community activity, and yard work  PERSONAL FACTORS: Age, Time since onset of injury/illness/exacerbation, and 3+ comorbidities: HTN, asthma, osteoporosis, and hearing loss  are also affecting patient's functional outcome.   REHAB POTENTIAL: Good  CLINICAL DECISION MAKING: Evolving/moderate complexity  EVALUATION COMPLEXITY: Moderate   GOALS: Goals reviewed with patient? Yes  SHORT TERM GOALS: Target date: 08/23/22  Patient will be independent with her initial HEP.  Baseline: Goal status: INITIAL  2.  Patient will be able to complete her daily activities without her familiar pain exceeding 6/10. Baseline:  Goal status: INITIAL  3.  Patient be able to demonstrate proper lifting mechanics for improved safety picking up items from the floor. Baseline:  Goal status: INITIAL  LONG TERM GOALS: Target date: 09/13/22  Patient will be independent with her advanced HEP. Baseline:  Goal status: INITIAL  2.  Patient will be able to complete her daily activities without her familiar pain exceeding 4/10. Baseline:  Goal status: INITIAL  3.  Patient will improve her 5 times sit to stand to 13 seconds or less for improved lower extremity power. Baseline:  Goal status: INITIAL  4.  Patient will be able to safely ambulate at least 80 feet without an assistive device for improved household independence. Baseline:  Goal status: INITIAL  5.  Patient will report being able to do her yard work without being limited by her familiar low back pain. Baseline:  Goal status: INITIAL  6.  Patient will be able to walk for at least 45  minutes without being limited by her familiar low back pain. Baseline:  Goal status: INITIAL  PLAN:  PT FREQUENCY:  2x/week  PT DURATION: 6 weeks  PLANNED INTERVENTIONS: Therapeutic exercises, Therapeutic activity, Neuromuscular re-education, Balance training, Gait training, Patient/Family education, Self Care, Joint mobilization, Stair training, Electrical stimulation, Spinal mobilization, Cryotherapy, Moist heat, Manual therapy, and Re-evaluation.  PLAN FOR NEXT SESSION: nustep, lumbar and lower extremity strengthening, balance interventions, and modalities as needed   Kathrynn Ducking, PTA 08/06/2022, 11:32 AM

## 2022-08-08 ENCOUNTER — Other Ambulatory Visit: Payer: Self-pay

## 2022-08-08 ENCOUNTER — Telehealth: Payer: Self-pay | Admitting: Rheumatology

## 2022-08-08 NOTE — Telephone Encounter (Signed)
FYI: Patient called stating she broke her tooth and is planning to have implant. Patient states she was due to have her Prolia injection at her follow-up appointment tomorrow, 08/09/22, but was advised by the oral surgeon not to have it.   Will need to delay Prolia dose until clear by oral surgeon to proceed

## 2022-08-08 NOTE — Telephone Encounter (Signed)
FYI:  Patient called stating she broke her tooth and is planning to have implant.  Patient states she was due to have her Prolia injection at her follow-up appointment tomorrow, 08/09/22, but was advised by the oral surgeon not to have it.

## 2022-08-08 NOTE — Telephone Encounter (Signed)
Noted. We will delay Prolia administration until cleared by oral surgeon to proceed  Knox Saliva, PharmD, MPH, BCPS, CPP Clinical Pharmacist (Rheumatology and Pulmonology)

## 2022-08-09 ENCOUNTER — Ambulatory Visit: Payer: Medicare PPO | Attending: Physician Assistant | Admitting: Physician Assistant

## 2022-08-09 ENCOUNTER — Encounter: Payer: Self-pay | Admitting: Physician Assistant

## 2022-08-09 VITALS — BP 168/81 | HR 73 | Resp 12 | Ht 60.0 in | Wt 107.0 lb

## 2022-08-09 DIAGNOSIS — Z8709 Personal history of other diseases of the respiratory system: Secondary | ICD-10-CM

## 2022-08-09 DIAGNOSIS — E559 Vitamin D deficiency, unspecified: Secondary | ICD-10-CM

## 2022-08-09 DIAGNOSIS — M19041 Primary osteoarthritis, right hand: Secondary | ICD-10-CM

## 2022-08-09 DIAGNOSIS — M810 Age-related osteoporosis without current pathological fracture: Secondary | ICD-10-CM

## 2022-08-09 DIAGNOSIS — E2839 Other primary ovarian failure: Secondary | ICD-10-CM

## 2022-08-09 DIAGNOSIS — E2740 Unspecified adrenocortical insufficiency: Secondary | ICD-10-CM

## 2022-08-09 DIAGNOSIS — Z5181 Encounter for therapeutic drug level monitoring: Secondary | ICD-10-CM | POA: Diagnosis not present

## 2022-08-09 DIAGNOSIS — M5030 Other cervical disc degeneration, unspecified cervical region: Secondary | ICD-10-CM

## 2022-08-09 DIAGNOSIS — Z8719 Personal history of other diseases of the digestive system: Secondary | ICD-10-CM

## 2022-08-09 DIAGNOSIS — Z8781 Personal history of (healed) traumatic fracture: Secondary | ICD-10-CM

## 2022-08-09 DIAGNOSIS — M19042 Primary osteoarthritis, left hand: Secondary | ICD-10-CM

## 2022-08-09 DIAGNOSIS — E785 Hyperlipidemia, unspecified: Secondary | ICD-10-CM

## 2022-08-09 DIAGNOSIS — M1711 Unilateral primary osteoarthritis, right knee: Secondary | ICD-10-CM

## 2022-08-09 NOTE — Progress Notes (Signed)
Pharmacy Note  Subjective:  Patient presents today to [facility] for follow up office visit.   Patient was seen by the pharmacist for counseling on Forteo and Evenity. She has upcoming dental extraction on 08/29/22 and implant potentially 6 months after that. She is due for Prolia today but due to upcoming dential procedure will need to delay  Objective: CMP     Component Value Date/Time   NA 138 07/31/2022 1108   NA 138 01/23/2022 1512   K 4.4 07/31/2022 1108   CL 102 07/31/2022 1108   CO2 29 07/31/2022 1108   GLUCOSE 100 (H) 07/31/2022 1108   BUN 23 07/31/2022 1108   BUN 18 01/23/2022 1512   CREATININE 0.73 07/31/2022 1108   CALCIUM 9.4 07/31/2022 1108   PROT 6.1 07/31/2022 1108   PROT 5.8 (L) 01/23/2022 1512   ALBUMIN 4.3 01/23/2022 1512   AST 21 07/31/2022 1108   ALT 15 07/31/2022 1108   ALKPHOS 57 01/23/2022 1512   BILITOT 0.5 07/31/2022 1108   BILITOT 0.3 01/23/2022 1512   GFRNONAA 75 07/14/2020 1031   GFRAA 87 07/14/2020 1031    Vitamin D Lab Results  Component Value Date   VD25OH 36.6 01/23/2022   DEXA 05/04/20: BMD as determined from [facility] L1-L4 (L2,L3) is 0.820 g/cm2 with a T-Score of -2.9-Osteoporosis:on Prolia injections since December 24, 2018  Assessment/Plan:  Counseled patient on purpose, proper use, and adverse effects of Evenity.  Counseled patient that [place] is a medication that must be injected once monthly for 12 months by a healthcare professional.  Advised patient to take calcium 1200 mg daily and vitamin D 800 units daily.  Reviewed the most common adverse effects of Evenity including headache, osteonecrosis of the jaw, and muscle/bone pain.  Reviewed that [place] has FDA boxed warning for major adverse cardiovascular events. Reviewed that Evenity may increase the risk of myocardial infarction, stroke, and cardiovascular death. Confirmed that patient has not had MI or stroke within the preceding year  Patient confirms she does not have  any major dental work planned at this time.  Reviewed with patient the signs/symptoms of low calcium and advised patient to alert [place] if she experiences these symptoms.  Provided patient with medication education material and answered all questions. We will need to see if Evenity can be administered at [facility] as this is best option for her in terms of distance from home  Counseled patient on purpose, proper use, storage, and adverse effects of Forteo. Discussed the Forteo is PTH peptide agonist which results in stimulation of osteoblast and bone mass. Reviewed that Forteo treatment course is for 2 years. Discussed that pen is stable for 28 days after first use and must be stored in fridge after each dose. Counseled patient that [place] is a medication that must be injected once daily and that prescription for pen needles will be sent with Forteo prescription.  Advised patient to continue taking calcium 1200 mg daily and vitamin D supplement.  Reviewed the most common adverse effects of Forteo including orthostatic hypotension, GI upset, injection site reaction, and joint pain/arthralgia.  Discussed injection site reaction management and injection site locations. Discussed alternating injection site.  Discussed management of dizziness (which can occur for up to 4 hours after injection) including adequate hydration, slowly rising from bed/chairs to prevent fals, and taking Forteo at night if needed.  We walked through [employer] administration with demo pen and pen needle.  Discussed appropriate disposal of sharps. She is interested  in coming to clinic for first dose if this is what she decides to move forward with. We did complete [employer] PAP application today however patient states they will likely exceed income criteria for PAP     All questions encouraged and answered.   She will discuss with her husband and oral surgeon and let us know what she decides  Knox Saliva, PharmD, MPH, BCPS, CPP Clinical  Pharmacist (Rheumatology and Pulmonology)

## 2022-08-10 ENCOUNTER — Ambulatory Visit: Payer: Medicare PPO

## 2022-08-10 DIAGNOSIS — M5459 Other low back pain: Secondary | ICD-10-CM

## 2022-08-10 DIAGNOSIS — M6281 Muscle weakness (generalized): Secondary | ICD-10-CM

## 2022-08-10 NOTE — Therapy (Signed)
OUTPATIENT PHYSICAL THERAPY THORACOLUMBAR TREATMENT   Patient Name: Megan SaxBonnie M Juarbe MRN: [id number] DOB:04/22/1941, 82 y.o., female Today's Date: 08/10/2022  END OF SESSION:  PT End of Session - 08/10/22 1037     Visit Number 3    Number of Visits 12    Date for PT Re-Evaluation 10/05/22    PT Start Time 1030    PT Stop Time 1127    PT Time Calculation (min) 57 min    Activity Tolerance Patient tolerated treatment well    Behavior [facility] for tasks assessed/performed             Past Medical History:  Diagnosis Date   Adrenal insufficiency    Anemia    diet related   Asthma    Atrophic vaginitis    Colitis    Fracture of L1 vertebra    Hx of endometriosis    took Danacrine   Hypercholesterolemia    Hypertension    Osteoporosis    spine off Actonel sinsce 09/2010- [facility]   PONV (postoperative nausea and vomiting)    Premature ovarian failure age 82   HRT age 82 - 12/2000   Past Surgical History:  Procedure Laterality Date   CATARACT EXTRACTION W/PHACO Left 05/15/2021   Procedure: CATARACT EXTRACTION PHACO AND INTRAOCULAR LENS PLACEMENT (IOC);  Surgeon: Fabio PierceWrzosek, James, MD;  Location: [facility];  Service: Ophthalmology;  Laterality: Left;  CDE: 18.54   CATARACT EXTRACTION W/PHACO Right 05/29/2021   Procedure: CATARACT EXTRACTION PHACO AND INTRAOCULAR LENS PLACEMENT (IOC);  Surgeon: Fabio PierceWrzosek, James, MD;  Location: [facility];  Service: Ophthalmology;  Laterality: Right;  CDE: 14.51   HYSTEROSCOPY  1996   DUB--wnl   NASAL SINUS SURGERY  40's and 50's   TUBAL LIGATION Bilateral 1982   Patient Active Problem List   Diagnosis Date Noted   Essential hypertension 02/29/2020   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   Volume depletion, gastrointestinal loss 12/04/2019   Hyponatremia 12/03/2019   Dizziness 12/03/2019   Ketonuria 12/03/2019   Metabolic acidosis 12/03/2019   Chronic rhinitis 02/19/2019   Primary osteoarthritis involving multiple  joints 02/19/2019   DDD (degenerative disc disease), cervical 12/15/2018   Dyslipidemia 12/15/2018   History of asthma 12/15/2018   Elevated hemoglobin A1c 11/14/2017   Chronic neck pain 09/19/2016   Asthma 07/07/2015   Eczema 07/07/2015   Gastroesophageal reflux disease without esophagitis 07/07/2015   Restless legs syndrome 07/07/2015   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   Unspecified vitamin D deficiency 08/06/2013   Age-related osteoporosis without current pathological fracture 08/04/2012   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   Osteoporosis 08/04/2012   REFERRING PROVIDER: Iran Sizerosentino, Allison R, PA-C   REFERRING DIAG: Age-related osteoporosis with current pathological fracture, vertebra(e), subsequent encounter for fracture with routine healing   Rationale for Evaluation and Treatment: Rehabilitation  THERAPY DIAG:  Other low back pain  Muscle weakness (generalized)  ONSET DATE: December 2023  SUBJECTIVE:  SUBJECTIVE STATEMENT: Pt arrives for today's treatment session reporting 3/10 low back pain.    PERTINENT HISTORY:  HTN, asthma, osteoporosis, and hearing loss  PAIN:  Are you having pain? Yes: NPRS scale: 3/10 Pain location: low back (side varies)  Pain description: constant sharp pain  Aggravating factors: walking and standing (30 minutes at most), sleeping in her bed, bending over, riding in the car for long periods  Relieving factors: sitting  PRECAUTIONS: None  WEIGHT BEARING RESTRICTIONS: No  FALLS:  Has patient fallen in last 6 months?  No, but my balance is not good.   LIVING ENVIRONMENT: Lives with: lives with their spouse Lives in: House/apartment Stairs:  "yes, but I avoid going up stairs" Has following equipment at home: Quad cane large base; began  using the cane after her injury.   OCCUPATION: retired  PLOF: Independent  PATIENT GOALS: reduced pain, walk longer, improved balance, return to driving, and be able to do yard work   NEXT MD VISIT: May 2024   OBJECTIVE:   DIAGNOSTIC FINDINGS: 05/05/22 Lumbar CT scan  IMPRESSION: 1. Acute or subacute L1 inferior endplate compression fracture with unhealed fracture lucency, 25% loss of vertebral body height. No retropulsion or complicating features.   2. Osteopenia. No other acute osseous abnormality identified. Chronic L5 pars fractures.   3. Lumbar spine degeneration.  Gastric hiatal hernia.  SCREENING FOR RED FLAGS: Bowel or bladder incontinence: No Spinal tumors: No Cauda equina syndrome: No Compression fracture: No Abdominal aneurysm: No  COGNITION: Overall cognitive status: Within functional limits for tasks assessed     SENSATION: Patient reports no numbness or tingling  POSTURE: rounded shoulders, forward head, decreased lumbar lordosis, and increased thoracic kyphosis  PALPATION: TTP: bilateral QL and obliques  JOINT MOBILITY:   Lumbar: hypomobile with L1-2 reproducing familiar pain  LUMBAR ROM:   AROM eval  Flexion 28  Extension 8; increased pain, and minimal L5-S1 mobility  Right lateral flexion 50% limited  Left lateral flexion 50% limited; familiar pain  Right rotation 50% limited; familiar pain   Left rotation 50% limited; familiar pain   (Blank rows = not tested)  LOWER EXTREMITY ROM: WFL for activities assessed  LOWER EXTREMITY MMT:    MMT Right eval Left eval  Hip flexion 4-/5 3+/5  Hip extension    Hip abduction    Hip adduction    Hip internal rotation    Hip external rotation    Knee flexion 4/5 4/5  Knee extension 4+/5 5/5  Ankle dorsiflexion 4/5 4/5  Ankle plantarflexion    Ankle inversion    Ankle eversion     (Blank rows = not tested)  FUNCTIONAL TESTS:  5 times sit to stand: 16.06 seconds w/o UE support Timed up and  go (TUG): 13.15 seconds w/o assistive device  GAIT: Assistive device utilized: Quad cane large base Level of assistance: Modified independence Comments: decreased gait speed  TODAY'S TREATMENT:  DATE:                                    4/5 EXERCISE LOG  Exercise Repetitions and Resistance Comments  Nustep Lvl 3 x 15 mins   Ball Press 3 sec hold x 3 mins   [employer] Out 3 sec hold x 3 mins   LAQs 2# x 25 reps bil   Seated Marches 2# x 25 reps bil   Ball Squeeze X2 mins   Seated Hip Abduction Red t-band x 2 mins    Blank cell = exercise not performed today   Modalities  Date:  Unattended Estim: Lumbar, IFC 80-150 Hz , 15 mins, Pain Hot Pack: Lumbar, 15 mins, Pain and Tone   PATIENT EDUCATION:  Education details: HEP, prognosis, healing, benefits of exercise, and goals for therapy Person educated: Patient Education method: Explanation and Handouts Education comprehension: verbalized understanding and returned demonstration  HOME EXERCISE PROGRAM: [id number]  ASSESSMENT:  CLINICAL IMPRESSION: Pt arrives for today's treatment session reporting 3/10 low back pain.  Pt assisted with purchasing physio ball from [employer] for home use.  Pt able to tolerate increased time and/or reps with all previously performed exercises with min cues for proper technique.  Pt introduced to seated hip abduction and adduction with min cues for proper technique and hold.  Normal responses to estim and MH noted upon removal.  Pt reported decreased pain at completion of today's treatment session.   OBJECTIVE IMPAIRMENTS: decreased activity tolerance, decreased balance, decreased mobility, difficulty walking, decreased ROM, decreased strength, hypomobility, impaired tone, postural dysfunction, and pain.   ACTIVITY LIMITATIONS: carrying, lifting, bending, standing, and locomotion  level  PARTICIPATION LIMITATIONS: meal prep, cleaning, laundry, driving, shopping, community activity, and yard work  PERSONAL FACTORS: Age, Time since onset of injury/illness/exacerbation, and 3+ comorbidities: HTN, asthma, osteoporosis, and hearing loss  are also affecting patient's functional outcome.   REHAB POTENTIAL: Good  CLINICAL DECISION MAKING: Evolving/moderate complexity  EVALUATION COMPLEXITY: Moderate   GOALS: Goals reviewed with patient? Yes  SHORT TERM GOALS: Target date: 08/23/22  Patient will be independent with her initial HEP.  Baseline: Goal status: INITIAL  2.  Patient will be able to complete her daily activities without her familiar pain exceeding 6/10. Baseline:  Goal status: INITIAL  3.  Patient be able to demonstrate proper lifting mechanics for improved safety picking up items from the floor. Baseline:  Goal status: INITIAL  LONG TERM GOALS: Target date: 09/13/22  Patient will be independent with her advanced HEP. Baseline:  Goal status: INITIAL  2.  Patient will be able to complete her daily activities without her familiar pain exceeding 4/10. Baseline:  Goal status: INITIAL  3.  Patient will improve her 5 times sit to stand to 13 seconds or less for improved lower extremity power. Baseline:  Goal status: INITIAL  4.  Patient will be able to safely ambulate at least 80 feet without an assistive device for improved household independence. Baseline:  Goal status: INITIAL  5.  Patient will report being able to do her yard work without being limited by her familiar low back pain. Baseline:  Goal status: INITIAL  6.  Patient will be able to walk for at least 45 minutes without being limited by her familiar low back pain. Baseline:  Goal status: INITIAL  PLAN:  PT FREQUENCY: 2x/week  PT DURATION: 6 weeks  PLANNED INTERVENTIONS: Therapeutic exercises, Therapeutic  activity, Neuromuscular re-education, Balance training, Gait training,  Patient/Family education, Self Care, Joint mobilization, Stair training, Electrical stimulation, Spinal mobilization, Cryotherapy, Moist heat, Manual therapy, and Re-evaluation.  PLAN FOR NEXT SESSION: nustep, lumbar and lower extremity strengthening, balance interventions, and modalities as needed   Newman Pies, PTA 08/10/2022, 11:27 AM

## 2022-08-13 ENCOUNTER — Ambulatory Visit: Payer: Medicare PPO

## 2022-08-13 DIAGNOSIS — M5459 Other low back pain: Secondary | ICD-10-CM

## 2022-08-13 DIAGNOSIS — M6281 Muscle weakness (generalized): Secondary | ICD-10-CM

## 2022-08-13 NOTE — Therapy (Signed)
OUTPATIENT PHYSICAL THERAPY THORACOLUMBAR TREATMENT   Patient Name: Megan Logan MRN: [id number] DOB:February 13, 1941, 82 y.o., female Today's Date: 08/13/2022  END OF SESSION:  PT End of Session - 08/13/22 0949     Visit Number 4    Number of Visits 12    Date for PT Re-Evaluation 10/05/22    PT Start Time 0945    PT Stop Time 1046    PT Time Calculation (min) 61 min    Activity Tolerance Patient tolerated treatment well    Behavior During Therapy [facility] for tasks assessed/performed             Past Medical History:  Diagnosis Date   Adrenal insufficiency    Anemia    diet related   Asthma    Atrophic vaginitis    Colitis    Fracture of L1 vertebra    Hx of endometriosis    took Danacrine   Hypercholesterolemia    Hypertension    Osteoporosis    spine off Actonel sinsce 09/2010- [facility]   PONV (postoperative nausea and vomiting)    Premature ovarian failure age 57   HRT age 52 - 12/2000   Past Surgical History:  Procedure Laterality Date   CATARACT EXTRACTION W/PHACO Left 05/15/2021   Procedure: CATARACT EXTRACTION PHACO AND INTRAOCULAR LENS PLACEMENT (IOC);  Surgeon: Fabio Pierce, MD;  Location: [facility];  Service: Ophthalmology;  Laterality: Left;  CDE: 18.54   CATARACT EXTRACTION W/PHACO Right 05/29/2021   Procedure: CATARACT EXTRACTION PHACO AND INTRAOCULAR LENS PLACEMENT (IOC);  Surgeon: Fabio Pierce, MD;  Location: [facility];  Service: Ophthalmology;  Laterality: Right;  CDE: 14.51   HYSTEROSCOPY  1996   DUB--wnl   NASAL SINUS SURGERY  40's and 50's   TUBAL LIGATION Bilateral 1982   Patient Active Problem List   Diagnosis Date Noted   Essential hypertension 02/29/2020   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   Volume depletion, gastrointestinal loss 12/04/2019   Hyponatremia 12/03/2019   Dizziness 12/03/2019   Ketonuria 12/03/2019   Metabolic acidosis 12/03/2019   Chronic rhinitis 02/19/2019   Primary osteoarthritis involving multiple  joints 02/19/2019   DDD (degenerative disc disease), cervical 12/15/2018   Dyslipidemia 12/15/2018   History of asthma 12/15/2018   Elevated hemoglobin A1c 11/14/2017   Chronic neck pain 09/19/2016   Asthma 07/07/2015   Eczema 07/07/2015   Gastroesophageal reflux disease without esophagitis 07/07/2015   Restless legs syndrome 07/07/2015   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   Unspecified vitamin D deficiency 08/06/2013   Age-related osteoporosis without current pathological fracture 08/04/2012   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   Osteoporosis 08/04/2012   REFERRING PROVIDER: Iran Sizer, PA-C   REFERRING DIAG: Age-related osteoporosis with current pathological fracture, vertebra(e), subsequent encounter for fracture with routine healing   Rationale for Evaluation and Treatment: Rehabilitation  THERAPY DIAG:  Other low back pain  Muscle weakness (generalized)  ONSET DATE: December 2023  SUBJECTIVE:  SUBJECTIVE STATEMENT: Pt arrives for today's treatment session denying any pain while seated.  Pt reports being able to stand approx. 30 mins before needing to sit down.   PERTINENT HISTORY:  HTN, asthma, osteoporosis, and hearing loss  PAIN:  Are you having pain? No  PRECAUTIONS: None  WEIGHT BEARING RESTRICTIONS: No  FALLS:  Has patient fallen in last 6 months?  No, but my balance is not good.   LIVING ENVIRONMENT: Lives with: lives with their spouse Lives in: House/apartment Stairs:  "yes, but I avoid going up stairs" Has following equipment at home: Quad cane large base; began using the cane after her injury.   OCCUPATION: retired  PLOF: Independent  PATIENT GOALS: reduced pain, walk longer, improved balance, return to driving, and be able to do yard work    NEXT MD VISIT: May 2024   OBJECTIVE:   DIAGNOSTIC FINDINGS: 05/05/22 Lumbar CT scan  IMPRESSION: 1. Acute or subacute L1 inferior endplate compression fracture with unhealed fracture lucency, 25% loss of vertebral body height. No retropulsion or complicating features.   2. Osteopenia. No other acute osseous abnormality identified. Chronic L5 pars fractures.   3. Lumbar spine degeneration.  Gastric hiatal hernia.  SCREENING FOR RED FLAGS: Bowel or bladder incontinence: No Spinal tumors: No Cauda equina syndrome: No Compression fracture: No Abdominal aneurysm: No  COGNITION: Overall cognitive status: Within functional limits for tasks assessed     SENSATION: Patient reports no numbness or tingling  POSTURE: rounded shoulders, forward head, decreased lumbar lordosis, and increased thoracic kyphosis  PALPATION: TTP: bilateral QL and obliques  JOINT MOBILITY:   Lumbar: hypomobile with L1-2 reproducing familiar pain  LUMBAR ROM:   AROM eval  Flexion 28  Extension 8; increased pain, and minimal L5-S1 mobility  Right lateral flexion 50% limited  Left lateral flexion 50% limited; familiar pain  Right rotation 50% limited; familiar pain   Left rotation 50% limited; familiar pain   (Blank rows = not tested)  LOWER EXTREMITY ROM: WFL for activities assessed  LOWER EXTREMITY MMT:    MMT Right eval Left eval  Hip flexion 4-/5 3+/5  Hip extension    Hip abduction    Hip adduction    Hip internal rotation    Hip external rotation    Knee flexion 4/5 4/5  Knee extension 4+/5 5/5  Ankle dorsiflexion 4/5 4/5  Ankle plantarflexion    Ankle inversion    Ankle eversion     (Blank rows = not tested)  FUNCTIONAL TESTS:  5 times sit to stand: 16.06 seconds w/o UE support Timed up and go (TUG): 13.15 seconds w/o assistive device  GAIT: Assistive device utilized: Quad cane large base Level of assistance: Modified independence Comments: decreased gait speed  TODAY'S  TREATMENT:                                                                                                                              DATE:  4/8 EXERCISE LOG  Exercise Repetitions and Resistance Comments  Nustep Lvl 3 x 15 mins   [employer] x2 mins   Standing Hip Abductions    Rockerboard x2 mins   LAQs 3# x 20 reps bil   Seated Marches 3# x 20 reps bil   Ball Squeeze x2.5 mins   Seated Hip Abduction Red t-band x 2.5 mins    Blank cell = exercise not performed today   Modalities  Date:  Unattended Estim: Lumbar, IFC 80-150 Hz , 15 mins, Pain Hot Pack: Lumbar, 15 mins, Pain and Tone   PATIENT EDUCATION:  Education details: HEP, prognosis, healing, benefits of exercise, and goals for therapy Person educated: Patient Education method: Explanation and Handouts Education comprehension: verbalized understanding and returned demonstration  HOME EXERCISE PROGRAM: [id number]  ASSESSMENT:  CLINICAL IMPRESSION: Pt arrives for today's treatment session denying any pain while at rest.  Pt also reports that she is able to stand at longer intervals before having to sit down to rest.  Pt introduced to standing marches today without pain, but does endorse fatigue.  Pt able to tolerate increased weight and/or time with all previously performed exercises.  Normal responses to estim and MH noted upon removal.  Pt denied any pain at completion of today's treatment session.  OBJECTIVE IMPAIRMENTS: decreased activity tolerance, decreased balance, decreased mobility, difficulty walking, decreased ROM, decreased strength, hypomobility, impaired tone, postural dysfunction, and pain.   ACTIVITY LIMITATIONS: carrying, lifting, bending, standing, and locomotion level  PARTICIPATION LIMITATIONS: meal prep, cleaning, laundry, driving, shopping, community activity, and yard work  PERSONAL FACTORS: Age, Time since onset of  injury/illness/exacerbation, and 3+ comorbidities: HTN, asthma, osteoporosis, and hearing loss  are also affecting patient's functional outcome.   REHAB POTENTIAL: Good  CLINICAL DECISION MAKING: Evolving/moderate complexity  EVALUATION COMPLEXITY: Moderate   GOALS: Goals reviewed with patient? Yes  SHORT TERM GOALS: Target date: 08/23/22  Patient will be independent with her initial HEP.  Baseline: Goal status: INITIAL  2.  Patient will be able to complete her daily activities without her familiar pain exceeding 6/10. Baseline:  Goal status: INITIAL  3.  Patient be able to demonstrate proper lifting mechanics for improved safety picking up items from the floor. Baseline:  Goal status: INITIAL  LONG TERM GOALS: Target date: 09/13/22  Patient will be independent with her advanced HEP. Baseline:  Goal status: INITIAL  2.  Patient will be able to complete her daily activities without her familiar pain exceeding 4/10. Baseline:  Goal status: INITIAL  3.  Patient will improve her 5 times sit to stand to 13 seconds or less for improved lower extremity power. Baseline:  Goal status: INITIAL  4.  Patient will be able to safely ambulate at least 80 feet without an assistive device for improved household independence. Baseline:  Goal status: INITIAL  5.  Patient will report being able to do her yard work without being limited by her familiar low back pain. Baseline:  Goal status: INITIAL  6.  Patient will be able to walk for at least 45 minutes without being limited by her familiar low back pain. Baseline:  Goal status: INITIAL  PLAN:  PT FREQUENCY: 2x/week  PT DURATION: 6 weeks  PLANNED INTERVENTIONS: Therapeutic exercises, Therapeutic activity, Neuromuscular re-education, Balance training, Gait training, Patient/Family education, Self Care, Joint mobilization, Stair training, Electrical stimulation, Spinal mobilization, Cryotherapy, Moist heat, Manual therapy, and  Re-evaluation.  PLAN FOR NEXT  SESSION: nustep, lumbar and lower extremity strengthening, balance interventions, and modalities as needed   Newman PiesJennifer A Jaylea Plourde, PTA 08/13/2022, 10:56 AM

## 2022-08-15 ENCOUNTER — Telehealth: Payer: Self-pay | Admitting: Pharmacist

## 2022-08-15 NOTE — Telephone Encounter (Signed)
Error

## 2022-08-15 NOTE — Telephone Encounter (Signed)
PA and BIV is pending pt choice to start Forteo vs Evenity. Patient states she will call and let us know  [employer] pt assistance application is completed by pt and provider. Only information missing is annual household income  Chesley Mires, PharmD, MPH, BCPS, CPP Clinical Pharmacist (Rheumatology and Pulmonology)

## 2022-08-15 NOTE — Telephone Encounter (Deleted)
BIV is pending pt determination to start Evenity vs Forteo -   Forteo pt assistance application is completed (except for annual household income).  Chesley Mires, PharmD, MPH, BCPS, CPP Clinical Pharmacist (Rheumatology and Pulmonology)

## 2022-08-16 ENCOUNTER — Ambulatory Visit: Payer: Medicare PPO

## 2022-08-16 DIAGNOSIS — M5459 Other low back pain: Secondary | ICD-10-CM | POA: Diagnosis not present

## 2022-08-16 DIAGNOSIS — M6281 Muscle weakness (generalized): Secondary | ICD-10-CM

## 2022-08-16 NOTE — Therapy (Signed)
OUTPATIENT PHYSICAL THERAPY THORACOLUMBAR TREATMENT   Patient Name: Megan Logan MRN: [id number] DOB:09-Nov-1940, 82 y.o., female Today's Date: 08/16/2022  END OF SESSION:  PT End of Session - 08/16/22 0948     Visit Number 5    Number of Visits 12    Date for PT Re-Evaluation 10/05/22    PT Start Time 0945    PT Stop Time 1050    PT Time Calculation (min) 65 min    Activity Tolerance Patient tolerated treatment well    Behavior During Therapy [facility] for tasks assessed/performed             Past Medical History:  Diagnosis Date   Adrenal insufficiency    Anemia    diet related   Asthma    Atrophic vaginitis    Colitis    Fracture of L1 vertebra    Hx of endometriosis    took Danacrine   Hypercholesterolemia    Hypertension    Osteoporosis    spine off Actonel sinsce 09/2010- [facility]   PONV (postoperative nausea and vomiting)    Premature ovarian failure age 59   HRT age 9 - 12/2000   Past Surgical History:  Procedure Laterality Date   CATARACT EXTRACTION W/PHACO Left 05/15/2021   Procedure: CATARACT EXTRACTION PHACO AND INTRAOCULAR LENS PLACEMENT (IOC);  Surgeon: Fabio Pierce, MD;  Location: [facility];  Service: Ophthalmology;  Laterality: Left;  CDE: 18.54   CATARACT EXTRACTION W/PHACO Right 05/29/2021   Procedure: CATARACT EXTRACTION PHACO AND INTRAOCULAR LENS PLACEMENT (IOC);  Surgeon: Fabio Pierce, MD;  Location: [facility];  Service: Ophthalmology;  Laterality: Right;  CDE: 14.51   HYSTEROSCOPY  1996   DUB--wnl   NASAL SINUS SURGERY  40's and 50's   TUBAL LIGATION Bilateral 1982   Patient Active Problem List   Diagnosis Date Noted   Essential hypertension 02/29/2020   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   Volume depletion, gastrointestinal loss 12/04/2019   Hyponatremia 12/03/2019   Dizziness 12/03/2019   Ketonuria 12/03/2019   Metabolic acidosis 12/03/2019   Chronic rhinitis 02/19/2019   Primary osteoarthritis involving  multiple joints 02/19/2019   DDD (degenerative disc disease), cervical 12/15/2018   Dyslipidemia 12/15/2018   History of asthma 12/15/2018   Elevated hemoglobin A1c 11/14/2017   Chronic neck pain 09/19/2016   Asthma 07/07/2015   Eczema 07/07/2015   Gastroesophageal reflux disease without esophagitis 07/07/2015   Restless legs syndrome 07/07/2015   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   Unspecified vitamin D deficiency 08/06/2013   Age-related osteoporosis without current pathological fracture 08/04/2012   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   Osteoporosis 08/04/2012   REFERRING PROVIDER: Iran Sizer, PA-C   REFERRING DIAG: Age-related osteoporosis with current pathological fracture, vertebra(e), subsequent encounter for fracture with routine healing   Rationale for Evaluation and Treatment: Rehabilitation  THERAPY DIAG:  Other low back pain  Muscle weakness (generalized)  ONSET DATE: December 2023  SUBJECTIVE:  SUBJECTIVE STATEMENT: Pt arrives for today's treatment session denying any pain while seated.  Pt reports increased pain yesterday, but was on her feet a lot more for a church event.   PERTINENT HISTORY:  HTN, asthma, osteoporosis, and hearing loss  PAIN:  Are you having pain? No  PRECAUTIONS: None  WEIGHT BEARING RESTRICTIONS: No  FALLS:  Has patient fallen in last 6 months?  No, but my balance is not good.   LIVING ENVIRONMENT: Lives with: lives with their spouse Lives in: House/apartment Stairs:  "yes, but I avoid going up stairs" Has following equipment at home: Quad cane large base; began using the cane after her injury.   OCCUPATION: retired  PLOF: Independent  PATIENT GOALS: reduced pain, walk longer, improved balance, return to driving, and be  able to do yard work   NEXT MD VISIT: May 2024   OBJECTIVE:   DIAGNOSTIC FINDINGS: 05/05/22 Lumbar CT scan  IMPRESSION: 1. Acute or subacute L1 inferior endplate compression fracture with unhealed fracture lucency, 25% loss of vertebral body height. No retropulsion or complicating features.   2. Osteopenia. No other acute osseous abnormality identified. Chronic L5 pars fractures.   3. Lumbar spine degeneration.  Gastric hiatal hernia.  SCREENING FOR RED FLAGS: Bowel or bladder incontinence: No Spinal tumors: No Cauda equina syndrome: No Compression fracture: No Abdominal aneurysm: No  COGNITION: Overall cognitive status: Within functional limits for tasks assessed     SENSATION: Patient reports no numbness or tingling  POSTURE: rounded shoulders, forward head, decreased lumbar lordosis, and increased thoracic kyphosis  PALPATION: TTP: bilateral QL and obliques  JOINT MOBILITY:   Lumbar: hypomobile with L1-2 reproducing familiar pain  LUMBAR ROM:   AROM eval  Flexion 28  Extension 8; increased pain, and minimal L5-S1 mobility  Right lateral flexion 50% limited  Left lateral flexion 50% limited; familiar pain  Right rotation 50% limited; familiar pain   Left rotation 50% limited; familiar pain   (Blank rows = not tested)  LOWER EXTREMITY ROM: WFL for activities assessed  LOWER EXTREMITY MMT:    MMT Right eval Left eval  Hip flexion 4-/5 3+/5  Hip extension    Hip abduction    Hip adduction    Hip internal rotation    Hip external rotation    Knee flexion 4/5 4/5  Knee extension 4+/5 5/5  Ankle dorsiflexion 4/5 4/5  Ankle plantarflexion    Ankle inversion    Ankle eversion     (Blank rows = not tested)  FUNCTIONAL TESTS:  5 times sit to stand: 16.06 seconds w/o UE support Timed up and go (TUG): 13.15 seconds w/o assistive device  GAIT: Assistive device utilized: Quad cane large base Level of assistance: Modified independence Comments: decreased  gait speed  TODAY'S TREATMENT:  DATE:                                    4/11 EXERCISE LOG  Exercise Repetitions and Resistance Comments  Nustep Lvl 4 x 19 mins   [employer] x2 mins   Standing Hip Abductions    Rockerboard x2 mins   LAQs 3# x 25 reps bil   Seated Marches 3# x 25 reps bil   Ball Squeeze x3 mins   Seated Hip Abduction Red t-band x 3 mins    Blank cell = exercise not performed today   Modalities  Date:  Unattended Estim: Lumbar, IFC 80-150 Hz , 15 mins, Pain Hot Pack: Lumbar, 15 mins, Pain and Tone   PATIENT EDUCATION:  Education details: HEP, prognosis, healing, benefits of exercise, and goals for therapy Person educated: Patient Education method: Explanation and Handouts Education comprehension: verbalized understanding and returned demonstration  HOME EXERCISE PROGRAM: [id number]  ASSESSMENT:  CLINICAL IMPRESSION: Pt arrives for today's treatment session denying any pain while seated.  Pt reports that pain got up to 7/10 yesterday due to being up more than usual with a church activity.  Pt able to perform 5 STS test in 9.9 seconds meeting her long term goal.  Pt able to tolerate increased reps and time with all exercises today.  Normal responses to estim and MH noted upon removal.  Pt denied any pain at completion of today's treatment session.  OBJECTIVE IMPAIRMENTS: decreased activity tolerance, decreased balance, decreased mobility, difficulty walking, decreased ROM, decreased strength, hypomobility, impaired tone, postural dysfunction, and pain.   ACTIVITY LIMITATIONS: carrying, lifting, bending, standing, and locomotion level  PARTICIPATION LIMITATIONS: meal prep, cleaning, laundry, driving, shopping, community activity, and yard work  PERSONAL FACTORS: Age, Time since onset of  injury/illness/exacerbation, and 3+ comorbidities: HTN, asthma, osteoporosis, and hearing loss  are also affecting patient's functional outcome.   REHAB POTENTIAL: Good  CLINICAL DECISION MAKING: Evolving/moderate complexity  EVALUATION COMPLEXITY: Moderate   GOALS: Goals reviewed with patient? Yes  SHORT TERM GOALS: Target date: 08/23/22  Patient will be independent with her initial HEP.  Baseline: Goal status: MET  2.  Patient will be able to complete her daily activities without her familiar pain exceeding 6/10. Baseline: 4/11: up to a 7/10 at times when standing Goal status: IN PROGRESS  3.  Patient be able to demonstrate proper lifting mechanics for improved safety picking up items from the floor. Baseline:  Goal status: IN PROGRESS  LONG TERM GOALS: Target date: 09/13/22  Patient will be independent with her advanced HEP. Baseline:  Goal status: IN PROGRESS  2.  Patient will be able to complete her daily activities without her familiar pain exceeding 4/10. Baseline:  Goal status: IN PROGRESS  3.  Patient will improve her 5 times sit to stand to 13 seconds or less for improved lower extremity power. Baseline: 4/11: 9.9 seconds  Goal status: MET  4.  Patient will be able to safely ambulate at least 80 feet without an assistive device for improved household independence. Baseline:  Goal status: MET  5.  Patient will report being able to do her yard work without being limited by her familiar low back pain. Baseline: 4/11: "may try this weekend" Goal status: IN PROGRESS  6.  Patient will be able to walk for at least 45 minutes without being limited by  her familiar low back pain. Baseline: 4/11: 30-35 minutes Goal status: IN PROGRESS  PLAN:  PT FREQUENCY: 2x/week  PT DURATION: 6 weeks  PLANNED INTERVENTIONS: Therapeutic exercises, Therapeutic activity, Neuromuscular re-education, Balance training, Gait training, Patient/Family education, Self Care, Joint  mobilization, Stair training, Electrical stimulation, Spinal mobilization, Cryotherapy, Moist heat, Manual therapy, and Re-evaluation.  PLAN FOR NEXT SESSION: nustep, lumbar and lower extremity strengthening, balance interventions, and modalities as needed   Newman PiesJennifer A Niki Cosman, PTA 08/16/2022, 11:02 AM

## 2022-08-17 ENCOUNTER — Ambulatory Visit (HOSPITAL_COMMUNITY)
Admission: RE | Admit: 2022-08-17 | Discharge: 2022-08-17 | Disposition: A | Discharge status: Home / Self Care | Payer: Medicare PPO | Source: Ambulatory Visit | Attending: Physician Assistant | Admitting: Physician Assistant

## 2022-08-17 DIAGNOSIS — M810 Age-related osteoporosis without current pathological fracture: Secondary | ICD-10-CM

## 2022-08-17 NOTE — Progress Notes (Signed)
DEXA---LFN T-score -2.4.  Patient had a recent vertebral fracture.  Reviewed with Dr. Corliss Skains.  Ok to continue prolia once cleared by Energy manager.  Please clarify if she would like to proceed with forteo or evenity?

## 2022-08-20 ENCOUNTER — Ambulatory Visit: Payer: Medicare PPO

## 2022-08-21 NOTE — Telephone Encounter (Signed)
Called patient for update on osteoporosis treatment plan - unable to reach. Left VM requesting return call  Chesley Mires, PharmD, MPH, BCPS, CPP Clinical Pharmacist (Rheumatology and Pulmonology)

## 2022-08-22 ENCOUNTER — Ambulatory Visit: Payer: Medicare PPO | Admitting: Physical Therapy

## 2022-08-22 ENCOUNTER — Encounter: Payer: Self-pay | Admitting: Physical Therapy

## 2022-08-22 DIAGNOSIS — M5459 Other low back pain: Secondary | ICD-10-CM

## 2022-08-22 DIAGNOSIS — M6281 Muscle weakness (generalized): Secondary | ICD-10-CM

## 2022-08-22 NOTE — Therapy (Signed)
OUTPATIENT PHYSICAL THERAPY THORACOLUMBAR TREATMENT   Patient Name: Megan Logan MRN: [id number] DOB:12/18/1940, 82 y.o., female Today's Date: 08/22/2022  END OF SESSION:  PT End of Session - 08/22/22 0920     Visit Number 6    Number of Visits 12    Date for PT Re-Evaluation 10/05/22    PT Start Time 0905    PT Stop Time 0950    PT Time Calculation (min) 45 min    Activity Tolerance Patient tolerated treatment well    Behavior During Therapy [facility] for tasks assessed/performed            Past Medical History:  Diagnosis Date   Adrenal insufficiency    Anemia    diet related   Asthma    Atrophic vaginitis    Colitis    Fracture of L1 vertebra    Hx of endometriosis    took Danacrine   Hypercholesterolemia    Hypertension    Osteoporosis    spine off Actonel sinsce 09/2010- [facility]   PONV (postoperative nausea and vomiting)    Premature ovarian failure age 50   HRT age 22 - 12/2000   Past Surgical History:  Procedure Laterality Date   CATARACT EXTRACTION W/PHACO Left 05/15/2021   Procedure: CATARACT EXTRACTION PHACO AND INTRAOCULAR LENS PLACEMENT (IOC);  Surgeon: Fabio Pierce, MD;  Location: [facility];  Service: Ophthalmology;  Laterality: Left;  CDE: 18.54   CATARACT EXTRACTION W/PHACO Right 05/29/2021   Procedure: CATARACT EXTRACTION PHACO AND INTRAOCULAR LENS PLACEMENT (IOC);  Surgeon: Fabio Pierce, MD;  Location: [facility];  Service: Ophthalmology;  Laterality: Right;  CDE: 14.51   HYSTEROSCOPY  1996   DUB--wnl   NASAL SINUS SURGERY  40's and 50's   TUBAL LIGATION Bilateral 1982   Patient Active Problem List   Diagnosis Date Noted   Essential hypertension 02/29/2020   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   Volume depletion, gastrointestinal loss 12/04/2019   Hyponatremia 12/03/2019   Dizziness 12/03/2019   Ketonuria 12/03/2019   Metabolic acidosis 12/03/2019   Chronic rhinitis 02/19/2019   Primary osteoarthritis involving multiple  joints 02/19/2019   DDD (degenerative disc disease), cervical 12/15/2018   Dyslipidemia 12/15/2018   History of asthma 12/15/2018   Elevated hemoglobin A1c 11/14/2017   Chronic neck pain 09/19/2016   Asthma 07/07/2015   Eczema 07/07/2015   Gastroesophageal reflux disease without esophagitis 07/07/2015   Restless legs syndrome 07/07/2015   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   Unspecified vitamin D deficiency 08/06/2013   Age-related osteoporosis without current pathological fracture 08/04/2012   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   Osteoporosis 08/04/2012   REFERRING PROVIDER: Iran Sizer, PA-C   REFERRING DIAG: Age-related osteoporosis with current pathological fracture, vertebra(e), subsequent encounter for fracture with routine healing   Rationale for Evaluation and Treatment: Rehabilitation  THERAPY DIAG:  Other low back pain  Muscle weakness (generalized)  ONSET DATE: December 2023  SUBJECTIVE:  SUBJECTIVE STATEMENT: Reports that she has pain in her back and standing is the worst. Trying to be patient.   PERTINENT HISTORY:  HTN, asthma, osteoporosis, and hearing loss  PAIN:  Are you having pain? No  PRECAUTIONS: None  WEIGHT BEARING RESTRICTIONS: No  FALLS:  Has patient fallen in last 6 months?  No, but my balance is not good.   PATIENT GOALS: reduced pain, walk longer, improved balance, return to driving, and be able to do yard work   NEXT MD VISIT: May 2024   OBJECTIVE:   DIAGNOSTIC FINDINGS: 05/05/22 Lumbar CT scan  IMPRESSION: 1. Acute or subacute L1 inferior endplate compression fracture with unhealed fracture lucency, 25% loss of vertebral body height. No retropulsion or complicating features.   2. Osteopenia. No other acute osseous abnormality  identified. Chronic L5 pars fractures.   3. Lumbar spine degeneration.  Gastric hiatal hernia.  POSTURE: rounded shoulders, forward head, decreased lumbar lordosis, and increased thoracic kyphosis  PALPATION: TTP: bilateral QL and obliques  JOINT MOBILITY:   Lumbar: hypomobile with L1-2 reproducing familiar pain  LUMBAR ROM:   AROM eval  Flexion 28  Extension 8; increased pain, and minimal L5-S1 mobility  Right lateral flexion 50% limited  Left lateral flexion 50% limited; familiar pain  Right rotation 50% limited; familiar pain   Left rotation 50% limited; familiar pain   (Blank rows = not tested)  LOWER EXTREMITY ROM: WFL for activities assessed  LOWER EXTREMITY MMT:    MMT Right eval Left eval  Hip flexion 4-/5 3+/5  Hip extension    Hip abduction    Hip adduction    Hip internal rotation    Hip external rotation    Knee flexion 4/5 4/5  Knee extension 4+/5 5/5  Ankle dorsiflexion 4/5 4/5  Ankle plantarflexion    Ankle inversion    Ankle eversion     (Blank rows = not tested)  FUNCTIONAL TESTS:  5 times sit to stand: 16.06 seconds w/o UE support Timed up and go (TUG): 13.15 seconds w/o assistive device  GAIT: Assistive device utilized: Quad cane large base Level of assistance: Modified independence Comments: decreased gait speed  TODAY'S TREATMENT:                                                                                                                              DATE:                                    4/17 EXERCISE LOG  Exercise Repetitions and Resistance Comments  Nustep Lvl 4 x 18 mins   Standing Marches X20 reps   Standing Hip Abductions X20 reps   Forward step ups BLE 6" step x20 reps   LAQs 4# x 30 reps bil    Blank cell = exercise not performed today   Modalities  Date: 08/22/22 Unattended Estim:  Lumbar, IFC 80-150 Hz , 10 mins, Pain Hot Pack: Lumbar, 10 mins, Pain and Tone   PATIENT EDUCATION:  Education details: HEP,  prognosis, healing, benefits of exercise, and goals for therapy Person educated: Patient Education method: Explanation and Handouts Education comprehension: verbalized understanding and returned demonstration  HOME EXERCISE PROGRAM: [id number]  ASSESSMENT:  CLINICAL IMPRESSION: Patient presented in clinic with reports of greater LBP. Patient trying to be patient with her progress but would like to plant flowers. Patient progressed to standing exercises with no complaints. Patient did indicate more pain during nustep due to stiff back support. Normal modalities response noted following removal of the modalities.  OBJECTIVE IMPAIRMENTS: decreased activity tolerance, decreased balance, decreased mobility, difficulty walking, decreased ROM, decreased strength, hypomobility, impaired tone, postural dysfunction, and pain.   ACTIVITY LIMITATIONS: carrying, lifting, bending, standing, and locomotion level  PARTICIPATION LIMITATIONS: meal prep, cleaning, laundry, driving, shopping, community activity, and yard work  PERSONAL FACTORS: Age, Time since onset of injury/illness/exacerbation, and 3+ comorbidities: HTN, asthma, osteoporosis, and hearing loss  are also affecting patient's functional outcome.   REHAB POTENTIAL: Good  CLINICAL DECISION MAKING: Evolving/moderate complexity  EVALUATION COMPLEXITY: Moderate   GOALS: Goals reviewed with patient? Yes  SHORT TERM GOALS: Target date: 08/23/22  Patient will be independent with her initial HEP.  Baseline: Goal status: MET  2.  Patient will be able to complete her daily activities without her familiar pain exceeding 6/10. Baseline: 4/11: up to a 7/10 at times when standing Goal status: IN PROGRESS  3.  Patient be able to demonstrate proper lifting mechanics for improved safety picking up items from the floor. Baseline:  Goal status: IN PROGRESS  LONG TERM GOALS: Target date: 09/13/22  Patient will be independent with her advanced  HEP. Baseline:  Goal status: IN PROGRESS  2.  Patient will be able to complete her daily activities without her familiar pain exceeding 4/10. Baseline:  Goal status: IN PROGRESS  3.  Patient will improve her 5 times sit to stand to 13 seconds or less for improved lower extremity power. Baseline: 4/11: 9.9 seconds  Goal status: MET  4.  Patient will be able to safely ambulate at least 80 feet without an assistive device for improved household independence. Baseline:  Goal status: MET  5.  Patient will report being able to do her yard work without being limited by her familiar low back pain. Baseline: 4/11: "may try this weekend" Goal status: IN PROGRESS  6.  Patient will be able to walk for at least 45 minutes without being limited by her familiar low back pain. Baseline: 4/11: 30-35 minutes Goal status: IN PROGRESS  PLAN:  PT FREQUENCY: 2x/week  PT DURATION: 6 weeks  PLANNED INTERVENTIONS: Therapeutic exercises, Therapeutic activity, Neuromuscular re-education, Balance training, Gait training, Patient/Family education, Self Care, Joint mobilization, Stair training, Electrical stimulation, Spinal mobilization, Cryotherapy, Moist heat, Manual therapy, and Re-evaluation.  PLAN FOR NEXT SESSION: nustep, lumbar and lower extremity strengthening, balance interventions, and modalities as needed   Marvell Fuller, PTA 08/22/2022, 10:23 AM

## 2022-08-23 NOTE — Telephone Encounter (Signed)
I spoke with patient regarding her decision to move forward with Forteo vs Evenity. She has not yet decided and is still thinking on it. She said she is leaning more towards Forteo at this time but hasn't fully decided. She did check her income and knows she does not qualify for patient assistance program  Chesley Mires, PharmD, MPH, BCPS, CPP Clinical Pharmacist (Rheumatology and Pulmonology)

## 2022-08-23 NOTE — Telephone Encounter (Signed)
Patient returned call. She states she spoke with Designer, industrial/product. Her procedure is scheduled for 08/29/22 . Her surgeon said she could proceed with Prolia 2 weeks after her procedure which would be 09/12/22. Will reach out to her the first week of May to schedule Prolia injection as we already have medication on site  Chesley Mires, PharmD, MPH, BCPS, CPP Clinical Pharmacist (Rheumatology and Pulmonology)

## 2022-08-24 ENCOUNTER — Encounter: Payer: Self-pay | Admitting: *Deleted

## 2022-08-24 ENCOUNTER — Ambulatory Visit: Payer: Medicare PPO | Admitting: *Deleted

## 2022-08-24 DIAGNOSIS — M5459 Other low back pain: Secondary | ICD-10-CM

## 2022-08-24 DIAGNOSIS — M6281 Muscle weakness (generalized): Secondary | ICD-10-CM

## 2022-08-24 NOTE — Therapy (Signed)
OUTPATIENT PHYSICAL THERAPY THORACOLUMBAR TREATMENT   Patient Name: Megan Logan MRN: [id number] DOB:1940-11-25, 82 y.o., female Today's Date: 08/24/2022  END OF SESSION:  PT End of Session - 08/24/22 0952     Visit Number 7    Number of Visits 12    Date for PT Re-Evaluation 10/05/22    PT Start Time 0900    PT Stop Time 0948    PT Time Calculation (min) 48 min            Past Medical History:  Diagnosis Date   Adrenal insufficiency    Anemia    diet related   Asthma    Atrophic vaginitis    Colitis    Fracture of L1 vertebra    Hx of endometriosis    took Danacrine   Hypercholesterolemia    Hypertension    Osteoporosis    spine off Actonel sinsce 09/2010- [facility]   PONV (postoperative nausea and vomiting)    Premature ovarian failure age 52   HRT age 52 - 12/2000   Past Surgical History:  Procedure Laterality Date   CATARACT EXTRACTION W/PHACO Left 05/15/2021   Procedure: CATARACT EXTRACTION PHACO AND INTRAOCULAR LENS PLACEMENT (IOC);  Surgeon: Fabio Pierce, MD;  Location: [facility];  Service: Ophthalmology;  Laterality: Left;  CDE: 18.54   CATARACT EXTRACTION W/PHACO Right 05/29/2021   Procedure: CATARACT EXTRACTION PHACO AND INTRAOCULAR LENS PLACEMENT (IOC);  Surgeon: Fabio Pierce, MD;  Location: [facility];  Service: Ophthalmology;  Laterality: Right;  CDE: 14.51   HYSTEROSCOPY  1996   DUB--wnl   NASAL SINUS SURGERY  40's and 50's   TUBAL LIGATION Bilateral 1982   Patient Active Problem List   Diagnosis Date Noted   Essential hypertension 02/29/2020   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   Volume depletion, gastrointestinal loss 12/04/2019   Hyponatremia 12/03/2019   Dizziness 12/03/2019   Ketonuria 12/03/2019   Metabolic acidosis 12/03/2019   Chronic rhinitis 02/19/2019   Primary osteoarthritis involving multiple joints 02/19/2019   DDD (degenerative disc disease), cervical 12/15/2018   Dyslipidemia 12/15/2018   History of  asthma 12/15/2018   Elevated hemoglobin A1c 11/14/2017   Chronic neck pain 09/19/2016   Asthma 07/07/2015   Eczema 07/07/2015   Gastroesophageal reflux disease without esophagitis 07/07/2015   Restless legs syndrome 07/07/2015   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   Unspecified vitamin D deficiency 08/06/2013   Age-related osteoporosis without current pathological fracture 08/04/2012   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   Osteoporosis 08/04/2012   REFERRING PROVIDER: Iran Sizer, PA-C   REFERRING DIAG: Age-related osteoporosis with current pathological fracture, vertebra(e), subsequent encounter for fracture with routine healing   Rationale for Evaluation and Treatment: Rehabilitation  THERAPY DIAG:  Other low back pain  Muscle weakness (generalized)  ONSET DATE: December 2023  SUBJECTIVE:  SUBJECTIVE STATEMENT: Reports that she has pain in her back and standing is the worst. Did okay after last Rx  PERTINENT HISTORY:  HTN, asthma, osteoporosis, and hearing loss  PAIN:  Are you having pain? No  PRECAUTIONS: None  WEIGHT BEARING RESTRICTIONS: No  FALLS:  Has patient fallen in last 6 months?  No, but my balance is not good.   PATIENT GOALS: reduced pain, walk longer, improved balance, return to driving, and be able to do yard work   NEXT MD VISIT: May 2024   OBJECTIVE:   DIAGNOSTIC FINDINGS: 05/05/22 Lumbar CT scan  IMPRESSION: 1. Acute or subacute L1 inferior endplate compression fracture with unhealed fracture lucency, 25% loss of vertebral body height. No retropulsion or complicating features.   2. Osteopenia. No other acute osseous abnormality identified. Chronic L5 pars fractures.   3. Lumbar spine degeneration.  Gastric hiatal hernia.  POSTURE: rounded  shoulders, forward head, decreased lumbar lordosis, and increased thoracic kyphosis  PALPATION: TTP: bilateral QL and obliques  JOINT MOBILITY:   Lumbar: hypomobile with L1-2 reproducing familiar pain  LUMBAR ROM:   AROM eval  Flexion 28  Extension 8; increased pain, and minimal L5-S1 mobility  Right lateral flexion 50% limited  Left lateral flexion 50% limited; familiar pain  Right rotation 50% limited; familiar pain   Left rotation 50% limited; familiar pain   (Blank rows = not tested)  LOWER EXTREMITY ROM: WFL for activities assessed  LOWER EXTREMITY MMT:    MMT Right eval Left eval  Hip flexion 4-/5 3+/5  Hip extension    Hip abduction    Hip adduction    Hip internal rotation    Hip external rotation    Knee flexion 4/5 4/5  Knee extension 4+/5 5/5  Ankle dorsiflexion 4/5 4/5  Ankle plantarflexion    Ankle inversion    Ankle eversion     (Blank rows = not tested)  FUNCTIONAL TESTS:  5 times sit to stand: 16.06 seconds w/o UE support Timed up and go (TUG): 13.15 seconds w/o assistive device  GAIT: Assistive device utilized: Quad cane large base Level of assistance: Modified independence Comments: decreased gait speed  TODAY'S TREATMENT:                                                                                                                              DATE:                                    4/17 EXERCISE LOG  Exercise Repetitions and Resistance Comments  Nustep Lvl 4 x 15 mins   Standing Marches    Standing Hip Abductions    Forward step ups    LAQs     Blank cell = exercise not performed today  Discussed and reviewed ADL's and gardening and modified movement patterns to decrease pain triggers Modalities  Date:  08/22/22 Unattended Estim: Lumbar, IFC 80-150 Hz seated position , 10 mins, Pain Hot Pack: Lumbar, 10 mins, Pain and Tone   PATIENT EDUCATION:  Education details: HEP, prognosis, healing, benefits of exercise, and goals for  therapy Person educated: Patient Education method: Explanation and Handouts Education comprehension: verbalized understanding and returned demonstration  HOME EXERCISE PROGRAM: [id number]  ASSESSMENT:  CLINICAL IMPRESSION: Patient presented in clinic with reports of LBP, but much better since she has been coming. Rx focused on Exs as well as modifying movement patterns with ADL's and gardening. Pt did well with Rx and IFC and HMP end of session     OBJECTIVE IMPAIRMENTS: decreased activity tolerance, decreased balance, decreased mobility, difficulty walking, decreased ROM, decreased strength, hypomobility, impaired tone, postural dysfunction, and pain.   ACTIVITY LIMITATIONS: carrying, lifting, bending, standing, and locomotion level  PARTICIPATION LIMITATIONS: meal prep, cleaning, laundry, driving, shopping, community activity, and yard work  PERSONAL FACTORS: Age, Time since onset of injury/illness/exacerbation, and 3+ comorbidities: HTN, asthma, osteoporosis, and hearing loss  are also affecting patient's functional outcome.   REHAB POTENTIAL: Good  CLINICAL DECISION MAKING: Evolving/moderate complexity  EVALUATION COMPLEXITY: Moderate   GOALS: Goals reviewed with patient? Yes  SHORT TERM GOALS: Target date: 08/23/22  Patient will be independent with her initial HEP.  Baseline: Goal status: MET  2.  Patient will be able to complete her daily activities without her familiar pain exceeding 6/10. Baseline: 4/11: up to a 7/10 at times when standing Goal status: IN PROGRESS  3.  Patient be able to demonstrate proper lifting mechanics for improved safety picking up items from the floor. Baseline:  Goal status: IN PROGRESS  LONG TERM GOALS: Target date: 09/13/22  Patient will be independent with her advanced HEP. Baseline:  Goal status: IN PROGRESS  2.  Patient will be able to complete her daily activities without her familiar pain exceeding 4/10. Baseline:  Goal  status: IN PROGRESS  3.  Patient will improve her 5 times sit to stand to 13 seconds or less for improved lower extremity power. Baseline: 4/11: 9.9 seconds  Goal status: MET  4.  Patient will be able to safely ambulate at least 80 feet without an assistive device for improved household independence. Baseline:  Goal status: MET  5.  Patient will report being able to do her yard work without being limited by her familiar low back pain. Baseline: 4/11: "may try this weekend" Goal status: IN PROGRESS  6.  Patient will be able to walk for at least 45 minutes without being limited by her familiar low back pain. Baseline: 4/11: 30-35 minutes Goal status: IN PROGRESS  PLAN:  PT FREQUENCY: 2x/week  PT DURATION: 6 weeks  PLANNED INTERVENTIONS: Therapeutic exercises, Therapeutic activity, Neuromuscular re-education, Balance training, Gait training, Patient/Family education, Self Care, Joint mobilization, Stair training, Electrical stimulation, Spinal mobilization, Cryotherapy, Moist heat, Manual therapy, and Re-evaluation.  PLAN FOR NEXT SESSION: nustep, lumbar and lower extremity strengthening, balance interventions, and modalities as needed   Kerah Hardebeck,CHRIS, PTA 08/24/2022, 11:59 AM

## 2022-08-27 ENCOUNTER — Ambulatory Visit: Payer: Medicare PPO | Admitting: Physical Therapy

## 2022-08-27 ENCOUNTER — Encounter: Payer: Self-pay | Admitting: Physical Therapy

## 2022-08-27 DIAGNOSIS — M5459 Other low back pain: Secondary | ICD-10-CM | POA: Diagnosis not present

## 2022-08-27 DIAGNOSIS — M6281 Muscle weakness (generalized): Secondary | ICD-10-CM

## 2022-08-27 NOTE — Patient Instructions (Signed)

## 2022-08-27 NOTE — Therapy (Signed)
OUTPATIENT PHYSICAL THERAPY THORACOLUMBAR TREATMENT   Patient Name: Megan Logan MRN: [id number] DOB:Jun 13, 1940, 82 y.o., female Today's Date: 08/27/2022  END OF SESSION:  PT End of Session - 08/27/22 0901     Visit Number 8    Number of Visits 12    Date for PT Re-Evaluation 10/05/22    PT Start Time 0901    PT Stop Time 0951    PT Time Calculation (min) 50 min    Activity Tolerance Patient tolerated treatment well    Behavior During Therapy [facility] for tasks assessed/performed            Past Medical History:  Diagnosis Date   Adrenal insufficiency    Anemia    diet related   Asthma    Atrophic vaginitis    Colitis    Fracture of L1 vertebra    Hx of endometriosis    took Danacrine   Hypercholesterolemia    Hypertension    Osteoporosis    spine off Actonel sinsce 09/2010- [facility]   PONV (postoperative nausea and vomiting)    Premature ovarian failure age 45   HRT age 32 - 12/2000   Past Surgical History:  Procedure Laterality Date   CATARACT EXTRACTION W/PHACO Left 05/15/2021   Procedure: CATARACT EXTRACTION PHACO AND INTRAOCULAR LENS PLACEMENT (IOC);  Surgeon: Fabio Pierce, MD;  Location: [facility];  Service: Ophthalmology;  Laterality: Left;  CDE: 18.54   CATARACT EXTRACTION W/PHACO Right 05/29/2021   Procedure: CATARACT EXTRACTION PHACO AND INTRAOCULAR LENS PLACEMENT (IOC);  Surgeon: Fabio Pierce, MD;  Location: [facility];  Service: Ophthalmology;  Laterality: Right;  CDE: 14.51   HYSTEROSCOPY  1996   DUB--wnl   NASAL SINUS SURGERY  40's and 50's   TUBAL LIGATION Bilateral 1982   Patient Active Problem List   Diagnosis Date Noted   Essential hypertension 02/29/2020   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   Volume depletion, gastrointestinal loss 12/04/2019   Hyponatremia 12/03/2019   Dizziness 12/03/2019   Ketonuria 12/03/2019   Metabolic acidosis 12/03/2019   Chronic rhinitis 02/19/2019   Primary osteoarthritis involving multiple  joints 02/19/2019   DDD (degenerative disc disease), cervical 12/15/2018   Dyslipidemia 12/15/2018   History of asthma 12/15/2018   Elevated hemoglobin A1c 11/14/2017   Chronic neck pain 09/19/2016   Asthma 07/07/2015   Eczema 07/07/2015   Gastroesophageal reflux disease without esophagitis 07/07/2015   Restless legs syndrome 07/07/2015   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   Unspecified vitamin D deficiency 08/06/2013   Age-related osteoporosis without current pathological fracture 08/04/2012   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   Osteoporosis 08/04/2012   REFERRING PROVIDER: Iran Sizer, PA-C   REFERRING DIAG: Age-related osteoporosis with current pathological fracture, vertebra(e), subsequent encounter for fracture with routine healing   Rationale for Evaluation and Treatment: Rehabilitation  THERAPY DIAG:  Other low back pain  Muscle weakness (generalized)  ONSET DATE: December 2023  SUBJECTIVE:  SUBJECTIVE STATEMENT: Reports she has felt good until she got into the shower this morning. Pain from standing in the shower has lessened by arrival at PT. Did well after last PT session with extra standing exercises.  PERTINENT HISTORY:  HTN, asthma, osteoporosis, and hearing loss  PAIN:  Are you having pain? No  PRECAUTIONS: None  WEIGHT BEARING RESTRICTIONS: No  FALLS:  Has patient fallen in last 6 months?  No, but my balance is not good.   PATIENT GOALS: reduced pain, walk longer, improved balance, return to driving, and be able to do yard work   NEXT MD VISIT: May 2024   OBJECTIVE:   DIAGNOSTIC FINDINGS: 05/05/22 Lumbar CT scan  IMPRESSION: 1. Acute or subacute L1 inferior endplate compression fracture with unhealed fracture lucency, 25% loss of vertebral body  height. No retropulsion or complicating features.   2. Osteopenia. No other acute osseous abnormality identified. Chronic L5 pars fractures.   3. Lumbar spine degeneration.  Gastric hiatal hernia.  POSTURE: rounded shoulders, forward head, decreased lumbar lordosis, and increased thoracic kyphosis  PALPATION: TTP: bilateral QL and obliques  JOINT MOBILITY:   Lumbar: hypomobile with L1-2 reproducing familiar pain  LUMBAR ROM:   AROM eval  Flexion 28  Extension 8; increased pain, and minimal L5-S1 mobility  Right lateral flexion 50% limited  Left lateral flexion 50% limited; familiar pain  Right rotation 50% limited; familiar pain   Left rotation 50% limited; familiar pain   (Blank rows = not tested)  LOWER EXTREMITY ROM: WFL for activities assessed  LOWER EXTREMITY MMT:    MMT Right eval Left eval  Hip flexion 4-/5 3+/5  Hip extension    Hip abduction    Hip adduction    Hip internal rotation    Hip external rotation    Knee flexion 4/5 4/5  Knee extension 4+/5 5/5  Ankle dorsiflexion 4/5 4/5  Ankle plantarflexion    Ankle inversion    Ankle eversion     (Blank rows = not tested)  FUNCTIONAL TESTS:  5 times sit to stand: 16.06 seconds w/o UE support Timed up and go (TUG): 13.15 seconds w/o assistive device  GAIT: Assistive device utilized: Quad cane large base Level of assistance: Modified independence Comments: decreased gait speed  TODAY'S TREATMENT:                                                                                                                              DATE:                                    4/22 EXERCISE LOG  Exercise Repetitions and Resistance Comments  Nustep Lvl 4 x 15 mins   Standing Marches X30 reps   Standing Hip Abductions X30 reps each   Forward step ups X20 reps 6" step BLE   Standing rows Red theraband x20 reps  Sit to stands X15 reps min UE support    Blank cell = exercise not performed today    Modalities  Date: 08/27/22 Unattended Estim: Lumbar, IFC , 10 mins, Pain Hot Pack: Lumbar, 10 mins, Pain and Tone   PATIENT EDUCATION:  Education details:  Person educated: Patient Education method: Explanation and Handouts Education comprehension: verbalized understanding and returned demonstration  HOME EXERCISE PROGRAM: [id number]  ASSESSMENT:  CLINICAL IMPRESSION: Patient presented in clinic with discomfort with prolonged standing such as cooking or getting ready in the morning. Patient able to continue standing exercises and added in light shoulder/ upper back strengthening. Patient had no complaints with therex. Patient also instructed in proper sit to stand technique. Posture/ ADLs handout provided with thorough education regarding the techniques and implementing them in her ADLs. Patient also educated regarding rest breaks and energy conservation as well. Normal modalities response noted following removal of the modalities. Patient verbalized understanding of all patient education.  OBJECTIVE IMPAIRMENTS: decreased activity tolerance, decreased balance, decreased mobility, difficulty walking, decreased ROM, decreased strength, hypomobility, impaired tone, postural dysfunction, and pain.   ACTIVITY LIMITATIONS: carrying, lifting, bending, standing, and locomotion level  PARTICIPATION LIMITATIONS: meal prep, cleaning, laundry, driving, shopping, community activity, and yard work  PERSONAL FACTORS: Age, Time since onset of injury/illness/exacerbation, and 3+ comorbidities: HTN, asthma, osteoporosis, and hearing loss  are also affecting patient's functional outcome.   REHAB POTENTIAL: Good  CLINICAL DECISION MAKING: Evolving/moderate complexity  EVALUATION COMPLEXITY: Moderate   GOALS: Goals reviewed with patient? Yes  SHORT TERM GOALS: Target date: 08/23/22  Patient will be independent with her initial HEP.  Baseline: Goal status: MET  2.  Patient will be able to  complete her daily activities without her familiar pain exceeding 6/10. Baseline: 4/11: up to a 7/10 at times when standing Goal status: IN PROGRESS  3.  Patient be able to demonstrate proper lifting mechanics for improved safety picking up items from the floor. Baseline:  Goal status: IN PROGRESS  LONG TERM GOALS: Target date: 09/13/22  Patient will be independent with her advanced HEP. Baseline:  Goal status: IN PROGRESS  2.  Patient will be able to complete her daily activities without her familiar pain exceeding 4/10. Baseline:  Goal status: IN PROGRESS  3.  Patient will improve her 5 times sit to stand to 13 seconds or less for improved lower extremity power. Baseline: 4/11: 9.9 seconds  Goal status: MET  4.  Patient will be able to safely ambulate at least 80 feet without an assistive device for improved household independence. Baseline:  Goal status: MET  5.  Patient will report being able to do her yard work without being limited by her familiar low back pain. Baseline: 4/11: "may try this weekend" Goal status: IN PROGRESS  6.  Patient will be able to walk for at least 45 minutes without being limited by her familiar low back pain. Baseline: 4/11: 30-35 minutes Goal status: IN PROGRESS  PLAN:  PT FREQUENCY: 2x/week  PT DURATION: 6 weeks  PLANNED INTERVENTIONS: Therapeutic exercises, Therapeutic activity, Neuromuscular re-education, Balance training, Gait training, Patient/Family education, Self Care, Joint mobilization, Stair training, Electrical stimulation, Spinal mobilization, Cryotherapy, Moist heat, Manual therapy, and Re-evaluation.  PLAN FOR NEXT SESSION: nustep, lumbar and lower extremity strengthening, balance interventions, and modalities as needed   Marvell Fuller, PTA 08/27/2022, 9:58 AM

## 2022-08-29 ENCOUNTER — Ambulatory Visit: Payer: Medicare PPO | Admitting: Physical Therapy

## 2022-08-31 ENCOUNTER — Encounter: Payer: Self-pay | Admitting: Physical Therapy

## 2022-08-31 ENCOUNTER — Ambulatory Visit: Payer: Medicare PPO | Admitting: Physical Therapy

## 2022-08-31 DIAGNOSIS — M6281 Muscle weakness (generalized): Secondary | ICD-10-CM

## 2022-08-31 DIAGNOSIS — M5459 Other low back pain: Secondary | ICD-10-CM

## 2022-08-31 NOTE — Therapy (Signed)
OUTPATIENT PHYSICAL THERAPY THORACOLUMBAR TREATMENT   Patient Name: Megan Logan MRN: [id number] DOB:1940/08/19, 82 y.o., female Today's Date: 08/31/2022  END OF SESSION:  PT End of Session - 08/31/22 1000     Visit Number 9    Number of Visits 12    Date for PT Re-Evaluation 10/05/22    PT Start Time 0948    PT Stop Time 1037    PT Time Calculation (min) 49 min    Activity Tolerance Patient tolerated treatment well    Behavior During Therapy [facility] for tasks assessed/performed            Past Medical History:  Diagnosis Date   Adrenal insufficiency (HCC)    Anemia    diet related   Asthma    Atrophic vaginitis    Colitis    Fracture of L1 vertebra (HCC)    Hx of endometriosis    took Danacrine   Hypercholesterolemia    Hypertension    Osteoporosis    spine off Actonel sinsce 09/2010- [facility]   PONV (postoperative nausea and vomiting)    Premature ovarian failure age 68   HRT age 64 - 12/2000   Past Surgical History:  Procedure Laterality Date   CATARACT EXTRACTION W/PHACO Left 05/15/2021   Procedure: CATARACT EXTRACTION PHACO AND INTRAOCULAR LENS PLACEMENT (IOC);  Surgeon: Fabio Pierce, MD;  Location: [facility];  Service: Ophthalmology;  Laterality: Left;  CDE: 18.54   CATARACT EXTRACTION W/PHACO Right 05/29/2021   Procedure: CATARACT EXTRACTION PHACO AND INTRAOCULAR LENS PLACEMENT (IOC);  Surgeon: Fabio Pierce, MD;  Location: [facility];  Service: Ophthalmology;  Laterality: Right;  CDE: 14.51   HYSTEROSCOPY  1996   DUB--wnl   NASAL SINUS SURGERY  40's and 50's   TUBAL LIGATION Bilateral 1982   Patient Active Problem List   Diagnosis Date Noted   Essential hypertension 02/29/2020   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   Volume depletion, gastrointestinal loss 12/04/2019   Hyponatremia 12/03/2019   Dizziness 12/03/2019   Ketonuria 12/03/2019   Metabolic acidosis 12/03/2019   Chronic rhinitis 02/19/2019   Primary osteoarthritis  involving multiple joints 02/19/2019   DDD (degenerative disc disease), cervical 12/15/2018   Dyslipidemia 12/15/2018   History of asthma 12/15/2018   Elevated hemoglobin A1c 11/14/2017   Chronic neck pain 09/19/2016   Asthma 07/07/2015   Eczema 07/07/2015   Gastroesophageal reflux disease without esophagitis 07/07/2015   Restless legs syndrome 07/07/2015   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   Unspecified vitamin D deficiency 08/06/2013   Age-related osteoporosis without current pathological fracture 08/04/2012   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   Osteoporosis 08/04/2012   REFERRING PROVIDER: Iran Sizer, PA-C   REFERRING DIAG: Age-related osteoporosis with current pathological fracture, vertebra(e), subsequent encounter for fracture with routine healing   Rationale for Evaluation and Treatment: Rehabilitation  THERAPY DIAG:  Other low back pain  Muscle weakness (generalized)  ONSET DATE: December 2023  SUBJECTIVE:  SUBJECTIVE STATEMENT: Had a tooth pulled yesterday.  PERTINENT HISTORY:  HTN, asthma, osteoporosis, and hearing loss  PAIN:  Are you having pain? Yes: NPRS scale: 5/10 Pain location: low back Pain description: discomfort Aggravating factors: standing Relieving factors: Rest    PRECAUTIONS: None  WEIGHT BEARING RESTRICTIONS: No  FALLS:  Has patient fallen in last 6 months?  No, but my balance is not good.   PATIENT GOALS: reduced pain, walk longer, improved balance, return to driving, and be able to do yard work   NEXT MD VISIT: May 2024   OBJECTIVE:   DIAGNOSTIC FINDINGS: 05/05/22 Lumbar CT scan  IMPRESSION: 1. Acute or subacute L1 inferior endplate compression fracture with unhealed fracture lucency, 25% loss of vertebral body height. No  retropulsion or complicating features.   2. Osteopenia. No other acute osseous abnormality identified. Chronic L5 pars fractures.   3. Lumbar spine degeneration.  Gastric hiatal hernia.  POSTURE: rounded shoulders, forward head, decreased lumbar lordosis, and increased thoracic kyphosis  PALPATION: TTP: bilateral QL and obliques  JOINT MOBILITY:   Lumbar: hypomobile with L1-2 reproducing familiar pain  LUMBAR ROM:   AROM eval  Flexion 28  Extension 8; increased pain, and minimal L5-S1 mobility  Right lateral flexion 50% limited  Left lateral flexion 50% limited; familiar pain  Right rotation 50% limited; familiar pain   Left rotation 50% limited; familiar pain   (Blank rows = not tested)  LOWER EXTREMITY ROM: WFL for activities assessed  LOWER EXTREMITY MMT:    MMT Right eval Left eval  Hip flexion 4-/5 3+/5  Hip extension    Hip abduction    Hip adduction    Hip internal rotation    Hip external rotation    Knee flexion 4/5 4/5  Knee extension 4+/5 5/5  Ankle dorsiflexion 4/5 4/5  Ankle plantarflexion    Ankle inversion    Ankle eversion     (Blank rows = not tested)  FUNCTIONAL TESTS:  5 times sit to stand: 16.06 seconds w/o UE support Timed up and go (TUG): 13.15 seconds w/o assistive device  GAIT: Assistive device utilized: Quad cane large base Level of assistance: Modified independence Comments: decreased gait speed  TODAY'S TREATMENT:                                                                                                                              DATE:                                    4/26 EXERCISE LOG  Exercise Repetitions and Resistance Comments  Nustep Lvl 4 x 15 mins Pain increased  Standing Marches X30 reps   Standing Hip Abductions X30 reps each   Standing shoulder extension X20 reps red theraband   Standing rows Red theraband x20 reps   LAQ 4# x30 reps each    Blank cell =  exercise not performed today   Modalities  Date:  08/31/22 Unattended Estim: Lumbar, IFC , 10 mins, Pain Hot Pack: Lumbar, 10 mins, Pain and Tone   PATIENT EDUCATION:  Education details:  Person educated: Patient Education method: Explanation and Handouts Education comprehension: verbalized understanding and returned demonstration  HOME EXERCISE PROGRAM: [id number]  ASSESSMENT:  CLINICAL IMPRESSION: Patient presented in clinic with reports of increased LBP as she did a few household chores prior to coming to PT today. Patient states that pain increased during nustep but felt like it was due to harder back rest. Patient progressed to more standing back/postural strengthening with no complaints. Normal modalities response noted following removal of the modalities.  OBJECTIVE IMPAIRMENTS: decreased activity tolerance, decreased balance, decreased mobility, difficulty walking, decreased ROM, decreased strength, hypomobility, impaired tone, postural dysfunction, and pain.   ACTIVITY LIMITATIONS: carrying, lifting, bending, standing, and locomotion level  PARTICIPATION LIMITATIONS: meal prep, cleaning, laundry, driving, shopping, community activity, and yard work  PERSONAL FACTORS: Age, Time since onset of injury/illness/exacerbation, and 3+ comorbidities: HTN, asthma, osteoporosis, and hearing loss  are also affecting patient's functional outcome.   REHAB POTENTIAL: Good  CLINICAL DECISION MAKING: Evolving/moderate complexity  EVALUATION COMPLEXITY: Moderate   GOALS: Goals reviewed with patient? Yes  SHORT TERM GOALS: Target date: 08/23/22  Patient will be independent with her initial HEP.  Baseline: Goal status: MET  2.  Patient will be able to complete her daily activities without her familiar pain exceeding 6/10. Baseline: 4/11: up to a 7/10 at times when standing Goal status: IN PROGRESS  3.  Patient be able to demonstrate proper lifting mechanics for improved safety picking up items from the floor. Baseline:  Goal  status: IN PROGRESS  LONG TERM GOALS: Target date: 09/13/22  Patient will be independent with her advanced HEP. Baseline:  Goal status: IN PROGRESS  2.  Patient will be able to complete her daily activities without her familiar pain exceeding 4/10. Baseline:  Goal status: IN PROGRESS  3.  Patient will improve her 5 times sit to stand to 13 seconds or less for improved lower extremity power. Baseline: 4/11: 9.9 seconds  Goal status: MET  4.  Patient will be able to safely ambulate at least 80 feet without an assistive device for improved household independence. Baseline:  Goal status: MET  5.  Patient will report being able to do her yard work without being limited by her familiar low back pain. Baseline: 4/11: "may try this weekend" Goal status: IN PROGRESS  6.  Patient will be able to walk for at least 45 minutes without being limited by her familiar low back pain. Baseline: 4/11: 30-35 minutes Goal status: IN PROGRESS  PLAN:  PT FREQUENCY: 2x/week  PT DURATION: 6 weeks  PLANNED INTERVENTIONS: Therapeutic exercises, Therapeutic activity, Neuromuscular re-education, Balance training, Gait training, Patient/Family education, Self Care, Joint mobilization, Stair training, Electrical stimulation, Spinal mobilization, Cryotherapy, Moist heat, Manual therapy, and Re-evaluation.  PLAN FOR NEXT SESSION: nustep, lumbar and lower extremity strengthening, balance interventions, and modalities as needed   Marvell Fuller, PTA 08/31/2022, 10:40 AM

## 2022-09-05 NOTE — Telephone Encounter (Signed)
ATC patient to determine if appropriately healing after dental procedure and if so, to schedule [facility] appt. Unable to reach. Left VM requesting return call for update  Chesley Mires, PharmD, MPH, BCPS, CPP Clinical Pharmacist (Rheumatology and Pulmonology)

## 2022-09-06 NOTE — Telephone Encounter (Signed)
ATC patient to determine if appropriately healing after dental procedure and if so, to schedule [facility] appt. Unable to reach. Left VM requesting return call for update  Jasmen Emrich, PharmD, MPH, BCPS, CPP Clinical Pharmacist (Rheumatology and Pulmonology) 

## 2022-09-07 ENCOUNTER — Encounter: Payer: Medicare PPO | Admitting: Physical Therapy

## 2022-09-11 ENCOUNTER — Encounter: Payer: Self-pay | Admitting: Physical Therapy

## 2022-09-11 ENCOUNTER — Ambulatory Visit: Payer: Medicare PPO | Attending: Physician Assistant | Admitting: Physical Therapy

## 2022-09-11 DIAGNOSIS — M6281 Muscle weakness (generalized): Secondary | ICD-10-CM | POA: Insufficient documentation

## 2022-09-11 DIAGNOSIS — M5459 Other low back pain: Secondary | ICD-10-CM | POA: Insufficient documentation

## 2022-09-11 NOTE — Therapy (Addendum)
OUTPATIENT PHYSICAL THERAPY THORACOLUMBAR TREATMENT   Patient Name: Megan Logan: [id number] DOB:1941/04/14, 82 y.o., female Today's Date: 09/11/2022  END OF SESSION:  PT End of Session - 09/11/22 0903     Visit Number 10    Number of Visits 12    Date for PT Re-Evaluation 10/05/22    PT Start Time 0902    PT Stop Time 0948    PT Time Calculation (min) 46 min    Activity Tolerance Patient tolerated treatment well    Behavior During Therapy [facility] for tasks assessed/performed            Past Medical History:  Diagnosis Date   Adrenal insufficiency (HCC)    Anemia    diet related   Asthma    Atrophic vaginitis    Colitis    Fracture of L1 vertebra (HCC)    Hx of endometriosis    took Danacrine   Hypercholesterolemia    Hypertension    Osteoporosis    spine off Actonel sinsce 09/2010- [facility]   PONV (postoperative nausea and vomiting)    Premature ovarian failure age 51   HRT age 65 - 12/2000   Past Surgical History:  Procedure Laterality Date   CATARACT EXTRACTION W/PHACO Left 05/15/2021   Procedure: CATARACT EXTRACTION PHACO AND INTRAOCULAR LENS PLACEMENT (IOC);  Surgeon: Fabio Pierce, MD;  Location: [facility];  Service: Ophthalmology;  Laterality: Left;  CDE: 18.54   CATARACT EXTRACTION W/PHACO Right 05/29/2021   Procedure: CATARACT EXTRACTION PHACO AND INTRAOCULAR LENS PLACEMENT (IOC);  Surgeon: Fabio Pierce, MD;  Location: [facility];  Service: Ophthalmology;  Laterality: Right;  CDE: 14.51   HYSTEROSCOPY  1996   DUB--wnl   NASAL SINUS SURGERY  40's and 50's   TUBAL LIGATION Bilateral 1982   Patient Active Problem List   Diagnosis Date Noted   Essential hypertension 02/29/2020   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   Volume depletion, gastrointestinal loss 12/04/2019   Hyponatremia 12/03/2019   Dizziness 12/03/2019   Ketonuria 12/03/2019   Metabolic acidosis 12/03/2019   Chronic rhinitis 02/19/2019   Primary osteoarthritis  involving multiple joints 02/19/2019   DDD (degenerative disc disease), cervical 12/15/2018   Dyslipidemia 12/15/2018   History of asthma 12/15/2018   Elevated hemoglobin A1c 11/14/2017   Chronic neck pain 09/19/2016   Asthma 07/07/2015   Eczema 07/07/2015   Gastroesophageal reflux disease without esophagitis 07/07/2015   Restless legs syndrome 07/07/2015   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   Unspecified vitamin D deficiency 08/06/2013   Age-related osteoporosis without current pathological fracture 08/04/2012   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   Osteoporosis 08/04/2012   REFERRING PROVIDER: Iran Sizer, PA-C   REFERRING DIAG: Age-related osteoporosis with current pathological fracture, vertebra(e), subsequent encounter for fracture with routine healing   Rationale for Evaluation and Treatment: Rehabilitation  THERAPY DIAG:  Other low back pain  Muscle weakness (generalized)  ONSET DATE: December 2023  SUBJECTIVE:  SUBJECTIVE STATEMENT: Reports that she has tried to be up for 40 minutes before sitting. By 40 minutes she needs to sit down and rest which calms pain. Has been trying to sweep her attic.  PERTINENT HISTORY:  HTN, asthma, osteoporosis, and hearing loss  PAIN:  Are you having pain? Yes: NPRS scale: 6/10 Pain location: low back Pain description: discomfort Aggravating factors: standing Relieving factors: Rest    PRECAUTIONS: None  WEIGHT BEARING RESTRICTIONS: No  FALLS:  Has patient fallen in last 6 months?  No, but my balance is not good.   PATIENT GOALS: reduced pain, walk longer, improved balance, return to driving, and be able to do yard work   NEXT MD VISIT: May 2024   OBJECTIVE:   DIAGNOSTIC FINDINGS: 05/05/22 Lumbar CT scan   IMPRESSION: 1. Acute or subacute L1 inferior endplate compression fracture with unhealed fracture lucency, 25% loss of vertebral body height. No retropulsion or complicating features.   2. Osteopenia. No other acute osseous abnormality identified. Chronic L5 pars fractures.   3. Lumbar spine degeneration.  Gastric hiatal hernia.  POSTURE: rounded shoulders, forward head, decreased lumbar lordosis, and increased thoracic kyphosis  PALPATION: TTP: bilateral QL and obliques  JOINT MOBILITY:   Lumbar: hypomobile with L1-2 reproducing familiar pain  LUMBAR ROM:   AROM eval  Flexion 28  Extension 8; increased pain, and minimal L5-S1 mobility  Right lateral flexion 50% limited  Left lateral flexion 50% limited; familiar pain  Right rotation 50% limited; familiar pain   Left rotation 50% limited; familiar pain   (Blank rows = not tested)  LOWER EXTREMITY ROM: WFL for activities assessed  LOWER EXTREMITY MMT:    MMT Right eval Left eval  Hip flexion 4-/5 3+/5  Hip extension    Hip abduction    Hip adduction    Hip internal rotation    Hip external rotation    Knee flexion 4/5 4/5  Knee extension 4+/5 5/5  Ankle dorsiflexion 4/5 4/5  Ankle plantarflexion    Ankle inversion    Ankle eversion     (Blank rows = not tested)  FUNCTIONAL TESTS:  5 times sit to stand: 16.06 seconds w/o UE support Timed up and go (TUG): 13.15 seconds w/o assistive device  GAIT: Assistive device utilized: Quad cane large base Level of assistance: Modified independence Comments: decreased gait speed  TODAY'S TREATMENT:                                                                                                                              DATE:        5/7 EXERCISE LOG  Exercise Repetitions and Resistance Comments  Nustep Lvl 3 x 15 mins   Standing Marches X30 reps   Standing Hip Abductions X30 reps each   Sit to stands With clam x20 reps    Standing rows Blue XTS x20 reps   LAQ 4#  x20 reps each    Marquita Palms  cell = exercise not performed today   Modalities  Date: 09/11/22 Unattended Estim: Lumbar, IFC , 10 mins, Pain Hot Pack: Lumbar, 10 mins, Pain and Tone   PATIENT EDUCATION:  Education details:  Person educated: Patient Education method: Explanation and Handouts Education comprehension: verbalized understanding and returned demonstration  HOME EXERCISE PROGRAM: [id number]  ASSESSMENT:  CLINICAL IMPRESSION: Patient presented in clinic with increased pain after trying to do more ADLs this morning prior to PT. Patient states that her activity tolerance is up to 40-45 minutes but requires rest breaks for 10-15 minutes in a relatively firm chair to reduce pain. Patient states that she really tries to push herself with activities at home but understanding of energy conservation techniques such as resting prior to activity limit. 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 Normal modalities response noted following removal of the modalities.  09/11/22 PROGRESS REPORT:  Patient is making good progress with skilled physical therapy as evidenced by her subjective reports, objective measures, functional mobility, and progress towards her goals.  She was able to meet her 5 times sit to stand goal and be able to safely ambulate without an assistive device. However she continues to experience elevated pain levels throughout the day when standing for prolonged periods.  She may benefit from additional physical therapy due to her recent setback last week.  Candi Leash, PT, DPT   OBJECTIVE IMPAIRMENTS: decreased activity tolerance, decreased balance, decreased mobility, difficulty walking, decreased ROM, decreased strength, hypomobility, impaired tone, postural dysfunction, and pain.   ACTIVITY LIMITATIONS: carrying, lifting, bending, standing, and locomotion level  PARTICIPATION LIMITATIONS: meal  prep, cleaning, laundry, driving, shopping, community activity, and yard work  PERSONAL FACTORS: Age, Time since onset of injury/illness/exacerbation, and 3+ comorbidities: HTN, asthma, osteoporosis, and hearing loss  are also affecting patient's functional outcome.   REHAB POTENTIAL: Good  CLINICAL DECISION MAKING: Evolving/moderate complexity  EVALUATION COMPLEXITY: Moderate   GOALS: Goals reviewed with patient? Yes  SHORT TERM GOALS: Target date: 08/23/22  Patient will be independent with her initial HEP.  Baseline: Goal status: MET  2.  Patient will be able to complete her daily activities without her familiar pain exceeding 6/10. Baseline: 4/11: up to a 7/10 at times when standing Goal status: PARTIALLY MET  3.  Patient be able to demonstrate proper lifting mechanics for improved safety picking up items from the floor. Baseline:  Goal status: IN PROGRESS  LONG TERM GOALS: Target date: 09/13/22  Patient will be independent with her advanced HEP. Baseline:  Goal status: IN PROGRESS  2.  Patient will be able to complete her daily activities without her familiar pain exceeding 4/10. Baseline:  Goal status: IN PROGRESS  3.  Patient will improve her 5 times sit to stand to 13 seconds or less for improved lower extremity power. Baseline: 4/11: 9.9 seconds  Goal status: MET  4.  Patient will be able to safely ambulate at least 80 feet without an assistive device for improved household independence. Baseline:  Goal status: MET  5.  Patient will report being able to do her yard work without being limited by her familiar low back pain. Baseline: 4/11: "may try this weekend" Goal status: IN PROGRESS  6.  Patient will be able to walk for at least 45 minutes without being limited by her familiar low back pain. Baseline: 4/11: 30-35 minutes Goal status: IN PROGRESS  PLAN:  PT FREQUENCY: 2x/week  PT DURATION:  6 weeks  PLANNED INTERVENTIONS: Therapeutic exercises,  Therapeutic activity, Neuromuscular re-education, Balance training, Gait training, Patient/Family education, Self Care, Joint mobilization, Stair training, Electrical stimulation, Spinal mobilization, Cryotherapy, Moist heat, Manual therapy, and Re-evaluation.  PLAN FOR NEXT SESSION: nustep, lumbar and lower extremity strengthening, balance interventions, and modalities as needed   Marvell Fuller, PTA 09/11/2022, 10:58 AM

## 2022-09-12 NOTE — Telephone Encounter (Signed)
Patient returned call - she has been advised that we will need clearance for dentist to move forward with Prol[facility]. Provided her w fax number to give their clinic  Chesley Mires, PharmD, MPH, BCPS, CPP Clinical Pharmacist (Rheumatology and Pulmonology)

## 2022-09-13 ENCOUNTER — Ambulatory Visit: Payer: Medicare PPO

## 2022-09-13 DIAGNOSIS — M5459 Other low back pain: Secondary | ICD-10-CM

## 2022-09-13 DIAGNOSIS — M6281 Muscle weakness (generalized): Secondary | ICD-10-CM

## 2022-09-13 NOTE — Therapy (Signed)
OUTPATIENT PHYSICAL THERAPY THORACOLUMBAR TREATMENT   Patient Name: Megan Logan MRN: [id number] DOB:[birth year], 82 y.o., female Today's Date: 09/13/2022  END OF SESSION:  PT End of Session - 09/13/22 0903     Visit Number 11    Number of Visits 12    Date for PT Re-Evaluation 10/05/22    PT Start Time 0900    PT Stop Time 1101    PT Time Calculation (min) 121 min    Activity Tolerance Patient tolerated treatment well    Behavior During Therapy [facility] for tasks assessed/performed             Past Medical History:  Diagnosis Date   Adrenal insufficiency (HCC)    Anemia    diet related   Asthma    Atrophic vaginitis    Colitis    Fracture of L1 vertebra (HCC)    Hx of endometriosis    took Danacrine   Hypercholesterolemia    Hypertension    Osteoporosis    spine off Actonel sinsce 09/2010- [facility]   PONV (postoperative nausea and vomiting)    Premature ovarian failure age 89   HRT age 17 - 12/2000   Past Surgical History:  Procedure Laterality Date   CATARACT EXTRACTION W/PHACO Left 05/15/2021   Procedure: CATARACT EXTRACTION PHACO AND INTRAOCULAR LENS PLACEMENT (IOC);  Surgeon: Fabio Pierce, MD;  Location: [facility];  Service: Ophthalmology;  Laterality: Left;  CDE: 18.54   CATARACT EXTRACTION W/PHACO Right 05/29/2021   Procedure: CATARACT EXTRACTION PHACO AND INTRAOCULAR LENS PLACEMENT (IOC);  Surgeon: Fabio Pierce, MD;  Location: [facility];  Service: Ophthalmology;  Laterality: Right;  CDE: 14.51   HYSTEROSCOPY  1996   DUB--wnl   NASAL SINUS SURGERY  40's and 50's   TUBAL LIGATION Bilateral 1982   Patient Active Problem List   Diagnosis Date Noted   Essential hypertension 02/29/2020   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   Volume depletion, gastrointestinal loss 12/04/2019   Hyponatremia 12/03/2019   Dizziness 12/03/2019   Ketonuria 12/03/2019   Metabolic acidosis 12/03/2019   Chronic rhinitis 02/19/2019   Primary osteoarthritis  involving multiple joints 02/19/2019   DDD (degenerative disc disease), cervical 12/15/2018   Dyslipidemia 12/15/2018   History of asthma 12/15/2018   Elevated hemoglobin A1c 11/14/2017   Chronic neck pain 09/19/2016   Asthma 07/07/2015   Eczema 07/07/2015   Gastroesophageal reflux disease without esophagitis 07/07/2015   Restless legs syndrome 07/07/2015   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   Unspecified vitamin D deficiency 08/06/2013   Age-related osteoporosis without current pathological fracture 08/04/2012   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   Osteoporosis 08/04/2012   REFERRING PROVIDER: Iran Sizer, PA-C   REFERRING DIAG: Age-related osteoporosis with current pathological fracture, vertebra(e), subsequent encounter for fracture with routine healing   Rationale for Evaluation and Treatment: Rehabilitation  THERAPY DIAG:  Other low back pain  Muscle weakness (generalized)  ONSET DATE: December 2023  SUBJECTIVE:  SUBJECTIVE STATEMENT: Patient reports that she is hurting today after standing up to take a shower and get dressed. She eventually had to stop and sit down.   PERTINENT HISTORY:  HTN, asthma, osteoporosis, and hearing loss  PAIN:  Are you having pain? Yes: NPRS scale: 6/10 Pain location: low back Pain description: discomfort Aggravating factors: standing Relieving factors: Rest    PRECAUTIONS: None  WEIGHT BEARING RESTRICTIONS: No  FALLS:  Has patient fallen in last 6 months?  No, but my balance is not good.   PATIENT GOALS: reduced pain, walk longer, improved balance, return to driving, and be able to do yard work   NEXT MD VISIT: May 2024   OBJECTIVE:   DIAGNOSTIC FINDINGS: 05/05/22 Lumbar CT scan  IMPRESSION: 1. Acute or subacute L1  inferior endplate compression fracture with unhealed fracture lucency, 25% loss of vertebral body height. No retropulsion or complicating features.   2. Osteopenia. No other acute osseous abnormality identified. Chronic L5 pars fractures.   3. Lumbar spine degeneration.  Gastric hiatal hernia.  POSTURE: rounded shoulders, forward head, decreased lumbar lordosis, and increased thoracic kyphosis  PALPATION: TTP: bilateral QL and obliques  JOINT MOBILITY:   Lumbar: hypomobile with L1-2 reproducing familiar pain  LUMBAR ROM:   AROM eval  Flexion 28  Extension 8; increased pain, and minimal L5-S1 mobility  Right lateral flexion 50% limited  Left lateral flexion 50% limited; familiar pain  Right rotation 50% limited; familiar pain   Left rotation 50% limited; familiar pain   (Blank rows = not tested)  LOWER EXTREMITY ROM: WFL for activities assessed  LOWER EXTREMITY MMT:    MMT Right eval Left eval  Hip flexion 4-/5 3+/5  Hip extension    Hip abduction    Hip adduction    Hip internal rotation    Hip external rotation    Knee flexion 4/5 4/5  Knee extension 4+/5 5/5  Ankle dorsiflexion 4/5 4/5  Ankle plantarflexion    Ankle inversion    Ankle eversion     (Blank rows = not tested)  FUNCTIONAL TESTS:  5 times sit to stand: 16.06 seconds w/o UE support Timed up and go (TUG): 13.15 seconds w/o assistive device  GAIT: Assistive device utilized: Quad cane large base Level of assistance: Modified independence Comments: decreased gait speed  TODAY'S TREATMENT:                                                                                                                              DATE:                                   5/9 EXERCISE LOG  Exercise Repetitions and Resistance Comments  Nustep  L4 x 15 minutes   Marching on foam  30 reps each  With BUE support   Tandem on foam  3 x 30 seconds each  Fingertip support at parallel bars   Resisted row  Blue XTS x 30 reps     Resisted pull down  20 reps    Slouch overcorrect 30 reps    Seated lumbar flexion w/ rotation  15 reps    Seated HS stretch  3 x 30 seconds each    Sit to stand w/ hip ADD isometric  17 reps     Blank cell = exercise not performed today  Modalities: no redness or adverse reaction to today's modalities  Date:  Unattended Estim: Lumbar, IFC @ 80-150 Hz w/ 40% scan, 15 mins, Pain Hot Pack: Lumbar, 15 mins, Pain          5/7 EXERCISE LOG  Exercise Repetitions and Resistance Comments  Nustep Lvl 3 x 15 mins   Standing Marches X30 reps   Standing Hip Abductions X30 reps each   Sit to stands With clam x20 reps    Standing rows Blue XTS x20 reps   LAQ 4# x20 reps each    Blank cell = exercise not performed today   Modalities  Date: 09/11/22 Unattended Estim: Lumbar, IFC , 10 mins, Pain Hot Pack: Lumbar, 10 mins, Pain and Tone   PATIENT EDUCATION:  Education details:  Person educated: Patient Education method: Explanation and Handouts Education comprehension: verbalized understanding and returned demonstration  HOME EXERCISE PROGRAM: [id number]  ASSESSMENT:  CLINICAL IMPRESSION: Patient was progressed with multiple familiar interventions for improved lumbar stability needed for improved function with her daily activities. She required minimal cueing with resisted pull downs to maintain elbow extension to isolate latissimus dorsi engagement. She experienced the most significant relief to her familiar symptoms with today's sitting interventions. She reported feeling better upon the conclusion of treatment. She continues to require skilled physical therapy to address her remaining impairments to maximize her functional mobility.   OBJECTIVE IMPAIRMENTS: decreased activity tolerance, decreased balance, decreased mobility, difficulty walking, decreased ROM, decreased strength, hypomobility, impaired tone, postural dysfunction, and pain.   ACTIVITY LIMITATIONS: carrying, lifting,  bending, standing, and locomotion level  PARTICIPATION LIMITATIONS: meal prep, cleaning, laundry, driving, shopping, community activity, and yard work  PERSONAL FACTORS: Age, Time since onset of injury/illness/exacerbation, and 3+ comorbidities: HTN, asthma, osteoporosis, and hearing loss  are also affecting patient's functional outcome.   REHAB POTENTIAL: Good  CLINICAL DECISION MAKING: Evolving/moderate complexity  EVALUATION COMPLEXITY: Moderate   GOALS: Goals reviewed with patient? Yes  SHORT TERM GOALS: Target date: 08/23/22  Patient will be independent with her initial HEP.  Baseline: Goal status: MET  2.  Patient will be able to complete her daily activities without her familiar pain exceeding 6/10. Baseline: 4/11: up to a 7/10 at times when standing Goal status: PARTIALLY MET  3.  Patient be able to demonstrate proper lifting mechanics for improved safety picking up items from the floor. Baseline:  Goal status: IN PROGRESS  LONG TERM GOALS: Target date: 09/13/22  Patient will be independent with her advanced HEP. Baseline:  Goal status: IN PROGRESS  2.  Patient will be able to complete her daily activities without her familiar pain exceeding 4/10. Baseline:  Goal status: IN PROGRESS  3.  Patient will improve her 5 times sit to stand to 13 seconds or less for improved lower extremity power. Baseline: 4/11: 9.9 seconds  Goal status: MET  4.  Patient will be able to safely ambulate at least 80 feet without an assistive device for improved household independence. Baseline:  Goal status: MET  5.  Patient  will report being able to do her yard work without being limited by her familiar low back pain. Baseline: 4/11: "may try this weekend" Goal status: IN PROGRESS  6.  Patient will be able to walk for at least 45 minutes without being limited by her familiar low back pain. Baseline: 4/11: 30-35 minutes Goal status: IN PROGRESS  PLAN:  PT FREQUENCY: 2x/week  PT  DURATION: 6 weeks  PLANNED INTERVENTIONS: Therapeutic exercises, Therapeutic activity, Neuromuscular re-education, Balance training, Gait training, Patient/Family education, Self Care, Joint mobilization, Stair training, Electrical stimulation, Spinal mobilization, Cryotherapy, Moist heat, Manual therapy, and Re-evaluation.  PLAN FOR NEXT SESSION: nustep, lumbar and lower extremity strengthening, balance interventions, and modalities as needed   Granville Lewis, PT 09/13/2022, 10:18 AM

## 2022-09-18 ENCOUNTER — Encounter: Payer: Self-pay | Admitting: Physical Therapy

## 2022-09-18 ENCOUNTER — Ambulatory Visit: Payer: Medicare PPO | Admitting: Physical Therapy

## 2022-09-18 DIAGNOSIS — M5459 Other low back pain: Secondary | ICD-10-CM | POA: Diagnosis not present

## 2022-09-18 DIAGNOSIS — M6281 Muscle weakness (generalized): Secondary | ICD-10-CM

## 2022-09-18 NOTE — Therapy (Signed)
OUTPATIENT PHYSICAL THERAPY THORACOLUMBAR TREATMENT   Patient Name: Megan Logan MRN: [id number] DOB:[birth year], 82 y.o., female Today's Date: 09/18/2022  END OF SESSION:  PT End of Session - 09/18/22 0947     Visit Number 12    Number of Visits 12    Date for PT Re-Evaluation 10/05/22    PT Start Time 0944    PT Stop Time 1032    PT Time Calculation (min) 48 min    Activity Tolerance Patient tolerated treatment well    Behavior During Therapy [facility] for tasks assessed/performed            Past Medical History:  Diagnosis Date   Adrenal insufficiency (HCC)    Anemia    diet related   Asthma    Atrophic vaginitis    Colitis    Fracture of L1 vertebra (HCC)    Hx of endometriosis    took Danacrine   Hypercholesterolemia    Hypertension    Osteoporosis    spine off Actonel sinsce 09/2010- [facility]   PONV (postoperative nausea and vomiting)    Premature ovarian failure age 62   HRT age 30 - 12/2000   Past Surgical History:  Procedure Laterality Date   CATARACT EXTRACTION W/PHACO Left 05/15/2021   Procedure: CATARACT EXTRACTION PHACO AND INTRAOCULAR LENS PLACEMENT (IOC);  Surgeon: Fabio Pierce, MD;  Location: [facility];  Service: Ophthalmology;  Laterality: Left;  CDE: 18.54   CATARACT EXTRACTION W/PHACO Right 05/29/2021   Procedure: CATARACT EXTRACTION PHACO AND INTRAOCULAR LENS PLACEMENT (IOC);  Surgeon: Fabio Pierce, MD;  Location: [facility];  Service: Ophthalmology;  Laterality: Right;  CDE: 14.51   HYSTEROSCOPY  1996   DUB--wnl   NASAL SINUS SURGERY  40's and 50's   TUBAL LIGATION Bilateral 1982   Patient Active Problem List   Diagnosis Date Noted   Essential hypertension 02/29/2020   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   Volume depletion, gastrointestinal loss 12/04/2019   Hyponatremia 12/03/2019   Dizziness 12/03/2019   Ketonuria 12/03/2019   Metabolic acidosis 12/03/2019   Chronic rhinitis 02/19/2019   Primary osteoarthritis  involving multiple joints 02/19/2019   DDD (degenerative disc disease), cervical 12/15/2018   Dyslipidemia 12/15/2018   History of asthma 12/15/2018   Elevated hemoglobin A1c 11/14/2017   Chronic neck pain 09/19/2016   Asthma 07/07/2015   Eczema 07/07/2015   Gastroesophageal reflux disease without esophagitis 07/07/2015   Restless legs syndrome 07/07/2015   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   Unspecified vitamin D deficiency 08/06/2013   Age-related osteoporosis without current pathological fracture 08/04/2012   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   Osteoporosis 08/04/2012   REFERRING PROVIDER: Iran Sizer, PA-C   REFERRING DIAG: Age-related osteoporosis with current pathological fracture, vertebra(e), subsequent encounter for fracture with routine healing   Rationale for Evaluation and Treatment: Rehabilitation  THERAPY DIAG:  Other low back pain  Muscle weakness (generalized)  ONSET DATE: December 2023  SUBJECTIVE:  SUBJECTIVE STATEMENT: Reports LBP today.  PERTINENT HISTORY:  HTN, asthma, osteoporosis, and hearing loss  PAIN:  Are you having pain? Yes: NPRS scale: 5/10 Pain location: low back Pain description: discomfort Aggravating factors: standing Relieving factors: Rest    PRECAUTIONS: None  WEIGHT BEARING RESTRICTIONS: No  FALLS:  Has patient fallen in last 6 months?  No, but my balance is not good.   PATIENT GOALS: reduced pain, walk longer, improved balance, return to driving, and be able to do yard work   NEXT MD VISIT: May 2024   OBJECTIVE:   DIAGNOSTIC FINDINGS: 05/05/22 Lumbar CT scan  IMPRESSION: 1. Acute or subacute L1 inferior endplate compression fracture with unhealed fracture lucency, 25% loss of vertebral body height. No retropulsion  or complicating features.   2. Osteopenia. No other acute osseous abnormality identified. Chronic L5 pars fractures.   3. Lumbar spine degeneration.  Gastric hiatal hernia.  POSTURE: rounded shoulders, forward head, decreased lumbar lordosis, and increased thoracic kyphosis  PALPATION: TTP: bilateral QL and obliques  JOINT MOBILITY:   Lumbar: hypomobile with L1-2 reproducing familiar pain  LUMBAR ROM:   AROM eval  Flexion 28  Extension 8; increased pain, and minimal L5-S1 mobility  Right lateral flexion 50% limited  Left lateral flexion 50% limited; familiar pain  Right rotation 50% limited; familiar pain   Left rotation 50% limited; familiar pain   (Blank rows = not tested)  LOWER EXTREMITY ROM: WFL for activities assessed  LOWER EXTREMITY MMT:    MMT Right eval Left eval  Hip flexion 4-/5 3+/5  Hip extension    Hip abduction    Hip adduction    Hip internal rotation    Hip external rotation    Knee flexion 4/5 4/5  Knee extension 4+/5 5/5  Ankle dorsiflexion 4/5 4/5  Ankle plantarflexion    Ankle inversion    Ankle eversion     (Blank rows = not tested)  FUNCTIONAL TESTS:  5 times sit to stand: 16.06 seconds w/o UE support Timed up and go (TUG): 13.15 seconds w/o assistive device  GAIT: Assistive device utilized: Quad cane large base Level of assistance: Modified independence Comments: decreased gait speed  TODAY'S TREATMENT:                                                                                                                              DATE:      5/14 EXERCISE LOG  Exercise Repetitions and Resistance Comments  Nustep  L3 x 15 minutes   Resisted row  Blue XTS x 30 reps    Resisted pull down  20 reps    Hip extension BLE x20 reps each   Hip abduction BLE x20 reps each   Sit to stands X20 reps   Horizontal abduction with OH flexion Red theraband x10 reps   LAQ 4# x20 reps each    Blank cell = exercise not performed today   Modalities:  Date: 09/18/22 Unattended Estim: Lumbar, Pre-Mod, 10 mins, Pain Hot Pack: Lumbar, 10 mins, Pain  PATIENT EDUCATION:  Education details:  Person educated: Patient Education method: Explanation and Handouts Education comprehension: verbalized understanding and returned demonstration  HOME EXERCISE PROGRAM: [id number]  ASSESSMENT:  CLINICAL IMPRESSION: Patient presented in clinic with moderate LBP. Patient reported receiving a taller flower box for mother's day and could tolerate planting flowers in there and using a cart to sit on to plant flowers. Patient requires rest breaks during activity as if she pushes through the activity, pain increases exponentially. Patient has a difficult and painful experience with prolonged static standing as well such as with showering. Normal modalities response noted following removal of the modalities.  OBJECTIVE IMPAIRMENTS: decreased activity tolerance, decreased balance, decreased mobility, difficulty walking, decreased ROM, decreased strength, hypomobility, impaired tone, postural dysfunction, and pain.   ACTIVITY LIMITATIONS: carrying, lifting, bending, standing, and locomotion level  PARTICIPATION LIMITATIONS: meal prep, cleaning, laundry, driving, shopping, community activity, and yard work  PERSONAL FACTORS: Age, Time since onset of injury/illness/exacerbation, and 3+ comorbidities: HTN, asthma, osteoporosis, and hearing loss  are also affecting patient's functional outcome.   REHAB POTENTIAL: Good  CLINICAL DECISION MAKING: Evolving/moderate complexity  EVALUATION COMPLEXITY: Moderate   GOALS: Goals reviewed with patient? Yes  SHORT TERM GOALS: Target date: 08/23/22  Patient will be independent with her initial HEP.  Baseline: Goal status: MET  2.  Patient will be able to complete her daily activities without her familiar pain exceeding 6/10. Baseline: 4/11: up to a 7/10 at times when standing Goal status: PARTIALLY MET  3.   Patient be able to demonstrate proper lifting mechanics for improved safety picking up items from the floor. Baseline:  Goal status: IN PROGRESS  LONG TERM GOALS: Target date: 09/13/22  Patient will be independent with her advanced HEP. Baseline:  Goal status: IN PROGRESS  2.  Patient will be able to complete her daily activities without her familiar pain exceeding 4/10. Baseline:  Goal status: IN PROGRESS  3.  Patient will improve her 5 times sit to stand to 13 seconds or less for improved lower extremity power. Baseline: 4/11: 9.9 seconds  Goal status: MET  4.  Patient will be able to safely ambulate at least 80 feet without an assistive device for improved household independence. Baseline:  Goal status: MET  5.  Patient will report being able to do her yard work without being limited by her familiar low back pain. Baseline: 4/11: "may try this weekend" Goal status: PARTIALLY MET  6.  Patient will be able to walk for at least 45 minutes without being limited by her familiar low back pain. Baseline: 4/11: 30-35 minutes Goal status: IN PROGRESS  PLAN:  PT FREQUENCY: 2x/week  PT DURATION: 6 weeks  PLANNED INTERVENTIONS: Therapeutic exercises, Therapeutic activity, Neuromuscular re-education, Balance training, Gait training, Patient/Family education, Self Care, Joint mobilization, Stair training, Electrical stimulation, Spinal mobilization, Cryotherapy, Moist heat, Manual therapy, and Re-evaluation.  PLAN FOR NEXT SESSION: nustep, lumbar and lower extremity strengthening, balance interventions, and modalities as needed   Marvell Fuller, PTA 09/18/2022, 10:36 AM

## 2022-09-18 NOTE — Addendum Note (Signed)
Addended by: Granville Lewis on: 09/18/2022 06:37 PM   Modules accepted: Orders

## 2022-09-20 NOTE — Telephone Encounter (Signed)
Patient states dental surgeon was supposed to fax clearance letter. She will call them again to request clearance be refaxed  Chesley Mires, PharmD, MPH, BCPS, CPP Clinical Pharmacist (Rheumatology and Pulmonology)

## 2022-09-20 NOTE — Telephone Encounter (Signed)
Patient scheduled for Prolia on 09/26/22. Received clearance from dentist to proceed  Chesley Mires, PharmD, MPH, BCPS, CPP Clinical Pharmacist (Rheumatology and Pulmonology)

## 2022-09-26 ENCOUNTER — Other Ambulatory Visit (HOSPITAL_COMMUNITY): Payer: Self-pay

## 2022-09-26 ENCOUNTER — Telehealth: Payer: Self-pay | Admitting: Pharmacist

## 2022-09-26 ENCOUNTER — Ambulatory Visit: Payer: Medicare PPO | Attending: Rheumatology | Admitting: Pharmacist

## 2022-09-26 DIAGNOSIS — M810 Age-related osteoporosis without current pathological fracture: Secondary | ICD-10-CM

## 2022-09-26 DIAGNOSIS — Z7689 Persons encountering health services in other specified circumstances: Secondary | ICD-10-CM

## 2022-09-26 MED ORDER — DENOSUMAB 60 MG/ML ~~LOC~~ SOSY
60.0000 mg | PREFILLED_SYRINGE | Freq: Once | SUBCUTANEOUS | Status: AC
  Administered 2022-09-26: 60 mg via SUBCUTANEOUS

## 2022-09-26 NOTE — Telephone Encounter (Signed)
Submitted a Prior Authorization request to [facility] for FORTEO via CoverMyMeds. Will update once we receive a response.  Key: [id number]

## 2022-09-26 NOTE — Telephone Encounter (Signed)
Patient discussed at Prolia visit today and she would like to move forward with Ernesto Rutherford, PharmD, MPH, BCPS, CPP Clinical Pharmacist (Rheumatology and Pulmonology)

## 2022-09-26 NOTE — Progress Notes (Signed)
Pharmacy Note  Subjective:   Patient presents to clinic today to receive bi-annual dose of Prolia. Patient's last dose of Prolia was on 02/08/2022. She was cleared to resume Prolia injections by dental surgeon  She had L1 compression fracture per CT on 05/06/2023 and treatment change to Evenity or Forteo was discussed at last OV on 08/09/22  Patient running a fever or have signs/symptoms of infection? No  Patient currently on antibiotics for the treatment of infection? No  Patient had fall in the last 6 months?  No    Patient taking calcium 1200 mg daily through diet or supplement and at least 800 units vitamin D? Yes  Objective: CMP     Component Value Date/Time   NA 138 07/31/2022 1108   NA 138 01/23/2022 1512   K 4.4 07/31/2022 1108   CL 102 07/31/2022 1108   CO2 29 07/31/2022 1108   GLUCOSE 100 (H) 07/31/2022 1108   BUN 23 07/31/2022 1108   BUN 18 01/23/2022 1512   CREATININE 0.73 07/31/2022 1108   CALCIUM 9.4 07/31/2022 1108   PROT 6.1 07/31/2022 1108   PROT 5.8 (L) 01/23/2022 1512   ALBUMIN 4.3 01/23/2022 1512   AST 21 07/31/2022 1108   ALT 15 07/31/2022 1108   ALKPHOS 57 01/23/2022 1512   BILITOT 0.5 07/31/2022 1108   BILITOT 0.3 01/23/2022 1512   GFRNONAA 75 07/14/2020 1031   GFRAA 87 07/14/2020 1031    CBC    Component Value Date/Time   WBC 7.7 01/23/2022 1513   WBC 8.0 07/17/2021 1348   RBC 3.84 01/23/2022 1513   RBC 4.18 07/17/2021 1348   HGB 11.7 01/23/2022 1513   HGB 12.4 08/06/2013 1128   HCT 35.8 01/23/2022 1513   PLT 285 01/23/2022 1513   MCV 93 01/23/2022 1513   MCH 30.5 01/23/2022 1513   MCH 31.1 07/17/2021 1348   MCHC 32.7 01/23/2022 1513   MCHC 32.7 07/17/2021 1348   RDW 12.2 01/23/2022 1513   LYMPHSABS 1.1 01/23/2022 1513   EOSABS 0.0 01/23/2022 1513   BASOSABS 0.0 01/23/2022 1513    Lab Results  Component Value Date   VD25OH 36.6 01/23/2022    DEXA 05/04/20: BMD as determined from [facility] L1-L4 (L2,L3) is 0.820 g/cm2 with a  T-Score of -2.9-Osteoporosis.   Assessment/Plan:   Reviewed importance of adequate dietary intake of calcium in addition to supplementation due to risk of hypocalcemia with Prolia.   Patient tolerated injection without issue.   Administrations This Visit     denosumab (PROLIA) injection 60 mg     Admin Date 09/26/2022 Action Given Dose 60 mg Route Subcutaneous Administered By Murrell Redden, RPH-CPP           Patient has discussed with endocrinologist and they are in agreement that Forteo would be appropriate treatment option.  Patient is due for updated DEXA in April 2026.   All questions encouraged and answered.  Instructed patient to call with any further questions or concerns.  Chesley Mires, PharmD, MPH, BCPS, CPP Clinical Pharmacist (Rheumatology and Pulmonology)

## 2022-09-26 NOTE — Telephone Encounter (Signed)
Please start Forteo BIV. Patient will not qualify for patient assistance program, but is aware that copay will likely be $100 per month  Dose: once daily (2.4 mL per 28 days)  Chesley Mires, PharmD, MPH, BCPS, CPP Clinical Pharmacist (Rheumatology and Pulmonology)

## 2022-09-28 ENCOUNTER — Other Ambulatory Visit (HOSPITAL_COMMUNITY): Payer: Self-pay

## 2022-09-28 NOTE — Telephone Encounter (Signed)
Received notification from [facility] regarding a prior authorization for FORTEO. Authorization has been APPROVED from 09/28/22 to 05/07/23. Approval letter sent to scan center.  Per test claim, copay for 28 days supply is $100 (with DAW 9)  Patient can fill through [facility]: [phone]   EOC ID # [id number] Phone # [phone]  Chesley Mires, PharmD, MPH, BCPS, CPP Clinical Pharmacist (Rheumatology and Pulmonology)

## 2022-09-28 NOTE — Telephone Encounter (Signed)
Received fax from [facility] stating PA for Forteo is not required but we are still unable to run a test claim for brand with DAW 9. Called [employer] to help troubleshoot. Restarted authorization over the phone. Turnaround time of 3 days.  Phone: [phone] Phone: [phone] EOC ID # [id number] Fax: [phone]  Chesley Mires, PharmD, MPH, BCPS, CPP Clinical Pharmacist (Rheumatology and Pulmonology)

## 2022-10-08 ENCOUNTER — Ambulatory Visit: Payer: Medicare PPO | Attending: Physician Assistant | Admitting: Physical Therapy

## 2022-10-08 ENCOUNTER — Encounter: Payer: Self-pay | Admitting: Physical Therapy

## 2022-10-08 DIAGNOSIS — M6281 Muscle weakness (generalized): Secondary | ICD-10-CM | POA: Diagnosis present

## 2022-10-08 DIAGNOSIS — M5459 Other low back pain: Secondary | ICD-10-CM | POA: Insufficient documentation

## 2022-10-08 NOTE — Therapy (Signed)
OUTPATIENT PHYSICAL THERAPY THORACOLUMBAR TREATMENT   Patient Name: Megan Logan MRN: [id number] DOB:[birth year], 82 y.o., female Today's Date: 10/08/2022  END OF SESSION:  PT End of Session - 10/08/22 0850     Visit Number 13    Number of Visits 18    Date for PT Re-Evaluation 11/02/22    PT Start Time 0848    PT Stop Time 0930    PT Time Calculation (min) 42 min    Activity Tolerance Patient tolerated treatment well    Behavior During Therapy [facility] for tasks assessed/performed            Past Medical History:  Diagnosis Date   Adrenal insufficiency (HCC)    Anemia    diet related   Asthma    Atrophic vaginitis    Colitis    Fracture of L1 vertebra (HCC)    Hx of endometriosis    took Danacrine   Hypercholesterolemia    Hypertension    Osteoporosis    spine off Actonel sinsce 09/2010- [facility]   PONV (postoperative nausea and vomiting)    Premature ovarian failure age 42   HRT age 35 - 12/2000   Past Surgical History:  Procedure Laterality Date   CATARACT EXTRACTION W/PHACO Left 05/15/2021   Procedure: CATARACT EXTRACTION PHACO AND INTRAOCULAR LENS PLACEMENT (IOC);  Surgeon: Fabio Pierce, MD;  Location: [facility];  Service: Ophthalmology;  Laterality: Left;  CDE: 18.54   CATARACT EXTRACTION W/PHACO Right 05/29/2021   Procedure: CATARACT EXTRACTION PHACO AND INTRAOCULAR LENS PLACEMENT (IOC);  Surgeon: Fabio Pierce, MD;  Location: [facility];  Service: Ophthalmology;  Laterality: Right;  CDE: 14.51   HYSTEROSCOPY  1996   DUB--wnl   NASAL SINUS SURGERY  40's and 50's   TUBAL LIGATION Bilateral 1982   Patient Active Problem List   Diagnosis Date Noted   Essential hypertension 02/29/2020   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   Volume depletion, gastrointestinal loss 12/04/2019   Hyponatremia 12/03/2019   Dizziness 12/03/2019   Ketonuria 12/03/2019   Metabolic acidosis 12/03/2019   Chronic rhinitis 02/19/2019   Primary osteoarthritis  involving multiple joints 02/19/2019   DDD (degenerative disc disease), cervical 12/15/2018   Dyslipidemia 12/15/2018   History of asthma 12/15/2018   Elevated hemoglobin A1c 11/14/2017   Chronic neck pain 09/19/2016   Asthma 07/07/2015   Eczema 07/07/2015   Gastroesophageal reflux disease without esophagitis 07/07/2015   Restless legs syndrome 07/07/2015   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   Unspecified vitamin D deficiency 08/06/2013   Age-related osteoporosis without current pathological fracture 08/04/2012   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   Osteoporosis 08/04/2012   REFERRING PROVIDER: Iran Sizer, PA-C   REFERRING DIAG: Age-related osteoporosis with current pathological fracture, vertebra(e), subsequent encounter for fracture with routine healing   Rationale for Evaluation and Treatment: Rehabilitation  THERAPY DIAG:  Other low back pain  Muscle weakness (generalized)  ONSET DATE: December 2023  SUBJECTIVE:  SUBJECTIVE STATEMENT: Reports LBP but provided back brace from MD. Can maybe stand 5-10 minutes longer but reports it is very uncomfortable. Patient states that PA is also talking about doing shots and has started her on a new osteoporosis injection.  PERTINENT HISTORY:  HTN, asthma, osteoporosis, and hearing loss  PAIN:  Are you having pain? Yes: NPRS scale: 6/10 Pain location: low back Pain description: discomfort Aggravating factors: standing Relieving factors: Rest    PRECAUTIONS: None  WEIGHT BEARING RESTRICTIONS: No  FALLS:  Has patient fallen in last 6 months?  No, but my balance is not good.   PATIENT GOALS: reduced pain, walk longer, improved balance, return to driving, and be able to do yard work   NEXT MD VISIT: May 2024   OBJECTIVE:    DIAGNOSTIC FINDINGS: 05/05/22 Lumbar CT scan  IMPRESSION: 1. Acute or subacute L1 inferior endplate compression fracture with unhealed fracture lucency, 25% loss of vertebral body height. No retropulsion or complicating features.   2. Osteopenia. No other acute osseous abnormality identified. Chronic L5 pars fractures.   3. Lumbar spine degeneration.  Gastric hiatal hernia.  POSTURE: rounded shoulders, forward head, decreased lumbar lordosis, and increased thoracic kyphosis  PALPATION: TTP: bilateral QL and obliques  JOINT MOBILITY:   Lumbar: hypomobile with L1-2 reproducing familiar pain  LUMBAR ROM:   AROM eval  Flexion 28  Extension 8; increased pain, and minimal L5-S1 mobility  Right lateral flexion 50% limited  Left lateral flexion 50% limited; familiar pain  Right rotation 50% limited; familiar pain   Left rotation 50% limited; familiar pain   (Blank rows = not tested)  LOWER EXTREMITY ROM: WFL for activities assessed  LOWER EXTREMITY MMT:    MMT Right eval Left eval  Hip flexion 4-/5 3+/5  Hip extension    Hip abduction    Hip adduction    Hip internal rotation    Hip external rotation    Knee flexion 4/5 4/5  Knee extension 4+/5 5/5  Ankle dorsiflexion 4/5 4/5  Ankle plantarflexion    Ankle inversion    Ankle eversion     (Blank rows = not tested)  FUNCTIONAL TESTS:  5 times sit to stand: 16.06 seconds w/o UE support Timed up and go (TUG): 13.15 seconds w/o assistive device  GAIT: Assistive device utilized: Quad cane large base Level of assistance: Modified independence Comments: decreased gait speed  TODAY'S TREATMENT:                                                                                                                              DATE:      6/3 EXERCISE LOG  Exercise Repetitions and Resistance Comments  Nustep  L3 x 15 minutes   Resisted row  Blue XTS x 20 reps    Resisted pull down  Blue XTS 20 reps    Hip extension BLE x20  reps each green theraband   Hip abduction BLE x20 reps  each green theraband   Wall pushups X15 reps    Blank cell = exercise not performed today   Modalities:   Date: 10/08/22 Unattended Estim: Lumbar, Pre-Mod, 10 mins, Pain Hot Pack: Lumbar, 10 mins, Pain  PATIENT EDUCATION:  Education details: stand up weeding tool Person educated: Patient Education method: Explanation and Handouts Education comprehension: verbalized understanding and returned demonstration  HOME EXERCISE PROGRAM: [id number]  ASSESSMENT:  CLINICAL IMPRESSION: Patient presented in clinic with reports of increased LBP with activity and patient struggles with pacing and resting. Patient encouraged to use brace for activities but to try to not grow dependent on it. Walking program within her driveway as instructed but patient highly encouraged to rest. Patient was also shown a stand up weeding tool that prevents lumbar compensation in which she was very interested. Patient able to tolerate all therex well and enjoyed wall pushups. Normal modalities response noted following removal of the modalities.  OBJECTIVE IMPAIRMENTS: decreased activity tolerance, decreased balance, decreased mobility, difficulty walking, decreased ROM, decreased strength, hypomobility, impaired tone, postural dysfunction, and pain.   ACTIVITY LIMITATIONS: carrying, lifting, bending, standing, and locomotion level  PARTICIPATION LIMITATIONS: meal prep, cleaning, laundry, driving, shopping, community activity, and yard work  PERSONAL FACTORS: Age, Time since onset of injury/illness/exacerbation, and 3+ comorbidities: HTN, asthma, osteoporosis, and hearing loss  are also affecting patient's functional outcome.   REHAB POTENTIAL: Good  CLINICAL DECISION MAKING: Evolving/moderate complexity  EVALUATION COMPLEXITY: Moderate   GOALS: Goals reviewed with patient? Yes  SHORT TERM GOALS: Target date: 08/23/22  Patient will be independent with her  initial HEP.  Baseline: Goal status: MET  2.  Patient will be able to complete her daily activities without her familiar pain exceeding 6/10. Baseline: 4/11: up to a 7/10 at times when standing Goal status: PARTIALLY MET  3.  Patient be able to demonstrate proper lifting mechanics for improved safety picking up items from the floor. Baseline:  Goal status: IN PROGRESS  LONG TERM GOALS: Target date: 09/13/22  Patient will be independent with her advanced HEP. Baseline:  Goal status: IN PROGRESS  2.  Patient will be able to complete her daily activities without her familiar pain exceeding 4/10. Baseline:  Goal status: IN PROGRESS  3.  Patient will improve her 5 times sit to stand to 13 seconds or less for improved lower extremity power. Baseline: 4/11: 9.9 seconds  Goal status: MET  4.  Patient will be able to safely ambulate at least 80 feet without an assistive device for improved household independence. Baseline:  Goal status: MET  5.  Patient will report being able to do her yard work without being limited by her familiar low back pain. Baseline: 4/11: "may try this weekend" Goal status: PARTIALLY MET  6.  Patient will be able to walk for at least 45 minutes without being limited by her familiar low back pain. Baseline: 4/11: 30-35 minutes Goal status: IN PROGRESS  PLAN:  PT FREQUENCY: 2x/week  PT DURATION: 6 weeks  PLANNED INTERVENTIONS: Therapeutic exercises, Therapeutic activity, Neuromuscular re-education, Balance training, Gait training, Patient/Family education, Self Care, Joint mobilization, Stair training, Electrical stimulation, Spinal mobilization, Cryotherapy, Moist heat, Manual therapy, and Re-evaluation.  PLAN FOR NEXT SESSION: nustep, lumbar and lower extremity strengthening, balance interventions, and modalities as needed  Marvell Fuller, PTA 10/08/2022, 10:26 AM

## 2022-10-10 NOTE — Telephone Encounter (Signed)
ATC patient to schedule Forteo new start, unable to reach, left voicemail to return call.

## 2022-10-15 ENCOUNTER — Ambulatory Visit: Payer: Medicare PPO

## 2022-10-15 DIAGNOSIS — M5459 Other low back pain: Secondary | ICD-10-CM | POA: Diagnosis not present

## 2022-10-15 DIAGNOSIS — M6281 Muscle weakness (generalized): Secondary | ICD-10-CM

## 2022-10-15 NOTE — Therapy (Signed)
OUTPATIENT PHYSICAL THERAPY THORACOLUMBAR TREATMENT   Patient Name: Megan Logan MRN: [id number] DOB:1940/09/21, 82 y.o., female Today's Date: 10/15/2022  END OF SESSION:  PT End of Session - 10/15/22 0940     Visit Number 14    Number of Visits 18    Date for PT Re-Evaluation 11/02/22    PT Start Time 0930    PT Stop Time 1025    PT Time Calculation (min) 55 min    Activity Tolerance Patient tolerated treatment well    Behavior During Therapy [facility] for tasks assessed/performed            Past Medical History:  Diagnosis Date   Adrenal insufficiency (HCC)    Anemia    diet related   Asthma    Atrophic vaginitis    Colitis    Fracture of L1 vertebra (HCC)    Hx of endometriosis    took Danacrine   Hypercholesterolemia    Hypertension    Osteoporosis    spine off Actonel sinsce 09/2010- [facility]   PONV (postoperative nausea and vomiting)    Premature ovarian failure age 45   HRT age 48 - 12/2000   Past Surgical History:  Procedure Laterality Date   CATARACT EXTRACTION W/PHACO Left 05/15/2021   Procedure: CATARACT EXTRACTION PHACO AND INTRAOCULAR LENS PLACEMENT (IOC);  Surgeon: Fabio Pierce, MD;  Location: [facility];  Service: Ophthalmology;  Laterality: Left;  CDE: 18.54   CATARACT EXTRACTION W/PHACO Right 05/29/2021   Procedure: CATARACT EXTRACTION PHACO AND INTRAOCULAR LENS PLACEMENT (IOC);  Surgeon: Fabio Pierce, MD;  Location: [facility];  Service: Ophthalmology;  Laterality: Right;  CDE: 14.51   HYSTEROSCOPY  1996   DUB--wnl   NASAL SINUS SURGERY  40's and 50's   TUBAL LIGATION Bilateral 1982   Patient Active Problem List   Diagnosis Date Noted   Essential hypertension 02/29/2020   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   Volume depletion, gastrointestinal loss 12/04/2019   Hyponatremia 12/03/2019   Dizziness 12/03/2019   Ketonuria 12/03/2019   Metabolic acidosis 12/03/2019   Chronic rhinitis 02/19/2019   Primary osteoarthritis  involving multiple joints 02/19/2019   DDD (degenerative disc disease), cervical 12/15/2018   Dyslipidemia 12/15/2018   History of asthma 12/15/2018   Elevated hemoglobin A1c 11/14/2017   Chronic neck pain 09/19/2016   Asthma 07/07/2015   Eczema 07/07/2015   Gastroesophageal reflux disease without esophagitis 07/07/2015   Restless legs syndrome 07/07/2015   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   Unspecified vitamin D deficiency 08/06/2013   Age-related osteoporosis without current pathological fracture 08/04/2012   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   Osteoporosis 08/04/2012   REFERRING PROVIDER: Iran Sizer, PA-C   REFERRING DIAG: Age-related osteoporosis with current pathological fracture, vertebra(e), subsequent encounter for fracture with routine healing   Rationale for Evaluation and Treatment: Rehabilitation  THERAPY DIAG:  Other low back pain  Muscle weakness (generalized)  ONSET DATE: December 2023  SUBJECTIVE:  SUBJECTIVE STATEMENT: Pt reports 5/10 low back pain.  Pt reports pain was 8/10 this morning when she got up, but it has loosened up since then.  PERTINENT HISTORY:  HTN, asthma, osteoporosis, and hearing loss  PAIN:  Are you having pain? Yes: NPRS scale: 5/10 Pain location: low back Pain description: discomfort Aggravating factors: standing Relieving factors: Rest    PRECAUTIONS: None  WEIGHT BEARING RESTRICTIONS: No  FALLS:  Has patient fallen in last 6 months?  No, but my balance is not good.   PATIENT GOALS: reduced pain, walk longer, improved balance, return to driving, and be able to do yard work   NEXT MD VISIT: May 2024   OBJECTIVE:   DIAGNOSTIC FINDINGS: 05/05/22 Lumbar CT scan  IMPRESSION: 1. Acute or subacute L1 inferior endplate  compression fracture with unhealed fracture lucency, 25% loss of vertebral body height. No retropulsion or complicating features.   2. Osteopenia. No other acute osseous abnormality identified. Chronic L5 pars fractures.   3. Lumbar spine degeneration.  Gastric hiatal hernia.  POSTURE: rounded shoulders, forward head, decreased lumbar lordosis, and increased thoracic kyphosis  PALPATION: TTP: bilateral QL and obliques  JOINT MOBILITY:   Lumbar: hypomobile with L1-2 reproducing familiar pain  LUMBAR ROM:   AROM eval  Flexion 28  Extension 8; increased pain, and minimal L5-S1 mobility  Right lateral flexion 50% limited  Left lateral flexion 50% limited; familiar pain  Right rotation 50% limited; familiar pain   Left rotation 50% limited; familiar pain   (Blank rows = not tested)  LOWER EXTREMITY ROM: WFL for activities assessed  LOWER EXTREMITY MMT:    MMT Right eval Left eval  Hip flexion 4-/5 3+/5  Hip extension    Hip abduction    Hip adduction    Hip internal rotation    Hip external rotation    Knee flexion 4/5 4/5  Knee extension 4+/5 5/5  Ankle dorsiflexion 4/5 4/5  Ankle plantarflexion    Ankle inversion    Ankle eversion     (Blank rows = not tested)  FUNCTIONAL TESTS:  5 times sit to stand: 16.06 seconds w/o UE support Timed up and go (TUG): 13.15 seconds w/o assistive device  GAIT: Assistive device utilized: Quad cane large base Level of assistance: Modified independence Comments: decreased gait speed  TODAY'S TREATMENT:                                                                                                                              DATE:      6/10 EXERCISE LOG  Exercise Repetitions and Resistance Comments  Nustep  L3 x 15 minutes   Resisted row  Blue XTS x 20 reps    Resisted pull down  Blue XTS 20 reps    Rockerboard 4 mins   Hip extension BLE x25 reps each green theraband   Hip abduction BLE x25 reps each green theraband   Wall  pushups X15 reps  Blank cell = exercise not performed today   Modalities:   Date: 10/15/22 Unattended Estim: Lumbar, Pre-Mod, 15 mins, Pain Hot Pack: Lumbar, 15 mins, Pain  PATIENT EDUCATION:  Education details: stand up weeding tool Person educated: Patient Education method: Explanation and Handouts Education comprehension: verbalized understanding and returned demonstration  HOME EXERCISE PROGRAM: [id number]  ASSESSMENT:  CLINICAL IMPRESSION: Pt arrives for today's treatment session reporting 5/10 low back pain.  Pt reports that pain increased to 8/10 last night, but felt better as she began to move around.  Pt able to tolerate increased reps with all exercises today without issue or complaint of pain.  Normal responses to estim and MH noted upon removal.  Pt reported decrease in pain at completion of today's treatment session.   OBJECTIVE IMPAIRMENTS: decreased activity tolerance, decreased balance, decreased mobility, difficulty walking, decreased ROM, decreased strength, hypomobility, impaired tone, postural dysfunction, and pain.   ACTIVITY LIMITATIONS: carrying, lifting, bending, standing, and locomotion level  PARTICIPATION LIMITATIONS: meal prep, cleaning, laundry, driving, shopping, community activity, and yard work  PERSONAL FACTORS: Age, Time since onset of injury/illness/exacerbation, and 3+ comorbidities: HTN, asthma, osteoporosis, and hearing loss  are also affecting patient's functional outcome.   REHAB POTENTIAL: Good  CLINICAL DECISION MAKING: Evolving/moderate complexity  EVALUATION COMPLEXITY: Moderate   GOALS: Goals reviewed with patient? Yes  SHORT TERM GOALS: Target date: 08/23/22  Patient will be independent with her initial HEP.  Baseline: Goal status: MET  2.  Patient will be able to complete her daily activities without her familiar pain exceeding 6/10. Baseline: 4/11: up to a 7/10 at times when standing Goal status: PARTIALLY MET  3.   Patient be able to demonstrate proper lifting mechanics for improved safety picking up items from the floor. Baseline:  Goal status: IN PROGRESS  LONG TERM GOALS: Target date: 09/13/22  Patient will be independent with her advanced HEP. Baseline:  Goal status: IN PROGRESS  2.  Patient will be able to complete her daily activities without her familiar pain exceeding 4/10. Baseline:  Goal status: IN PROGRESS  3.  Patient will improve her 5 times sit to stand to 13 seconds or less for improved lower extremity power. Baseline: 4/11: 9.9 seconds  Goal status: MET  4.  Patient will be able to safely ambulate at least 80 feet without an assistive device for improved household independence. Baseline:  Goal status: MET  5.  Patient will report being able to do her yard work without being limited by her familiar low back pain. Baseline: 4/11: "may try this weekend" Goal status: PARTIALLY MET  6.  Patient will be able to walk for at least 45 minutes without being limited by her familiar low back pain. Baseline: 4/11: 30-35 minutes Goal status: IN PROGRESS  PLAN:  PT FREQUENCY: 2x/week  PT DURATION: 6 weeks  PLANNED INTERVENTIONS: Therapeutic exercises, Therapeutic activity, Neuromuscular re-education, Balance training, Gait training, Patient/Family education, Self Care, Joint mobilization, Stair training, Electrical stimulation, Spinal mobilization, Cryotherapy, Moist heat, Manual therapy, and Re-evaluation.  PLAN FOR NEXT SESSION: nustep, lumbar and lower extremity strengthening, balance interventions, and modalities as needed  Newman Pies, PTA 10/15/2022, 10:34 AM

## 2022-10-15 NOTE — Telephone Encounter (Signed)
Received return VM from patient. She states she discussed with periodontist on 10/10/22 and he would like her to delay starting on Forteo until after dental implants are completed.  Her implant will likely be completed in Nov 2024. Will await follow-up from patient  Chesley Mires, PharmD, MPH, BCPS, CPP Clinical Pharmacist (Rheumatology and Pulmonology)

## 2022-10-22 ENCOUNTER — Ambulatory Visit: Payer: Medicare PPO | Admitting: Physical Therapy

## 2022-10-22 ENCOUNTER — Encounter: Payer: Self-pay | Admitting: Physical Therapy

## 2022-10-22 DIAGNOSIS — M5459 Other low back pain: Secondary | ICD-10-CM

## 2022-10-22 DIAGNOSIS — M6281 Muscle weakness (generalized): Secondary | ICD-10-CM

## 2022-10-22 NOTE — Therapy (Signed)
OUTPATIENT PHYSICAL THERAPY THORACOLUMBAR TREATMENT   Patient Name: Megan Logan MRN: [id number] DOB:[birth year], 82 y.o., female Today's Date: 10/22/2022  END OF SESSION:  PT End of Session - 10/22/22 0941     Visit Number 15    Number of Visits 18    Date for PT Re-Evaluation 11/02/22    PT Start Time 0935    PT Stop Time 1019    PT Time Calculation (min) 44 min    Activity Tolerance Patient tolerated treatment well    Behavior During Therapy [facility] for tasks assessed/performed            Past Medical History:  Diagnosis Date   Adrenal insufficiency (HCC)    Anemia    diet related   Asthma    Atrophic vaginitis    Colitis    Fracture of L1 vertebra (HCC)    Hx of endometriosis    took Danacrine   Hypercholesterolemia    Hypertension    Osteoporosis    spine off Actonel sinsce 09/2010- Drug [facility]   PONV (postoperative nausea and vomiting)    Premature ovarian failure age 70   HRT age 43 - 12/2000   Past Surgical History:  Procedure Laterality Date   CATARACT EXTRACTION W/PHACO Left 05/15/2021   Procedure: CATARACT EXTRACTION PHACO AND INTRAOCULAR LENS PLACEMENT (IOC);  Surgeon: Fabio Pierce, MD;  Location: [facility];  Service: Ophthalmology;  Laterality: Left;  CDE: 18.54   CATARACT EXTRACTION W/PHACO Right 05/29/2021   Procedure: CATARACT EXTRACTION PHACO AND INTRAOCULAR LENS PLACEMENT (IOC);  Surgeon: Fabio Pierce, MD;  Location: [facility];  Service: Ophthalmology;  Laterality: Right;  CDE: 14.51   HYSTEROSCOPY  1996   DUB--wnl   NASAL SINUS SURGERY  40's and 50's   TUBAL LIGATION Bilateral 1982   Patient Active Problem List   Diagnosis Date Noted   Essential hypertension 02/29/2020   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   Volume depletion, gastrointestinal loss 12/04/2019   Hyponatremia 12/03/2019   Dizziness 12/03/2019   Ketonuria 12/03/2019   Metabolic acidosis 12/03/2019   Chronic rhinitis 02/19/2019   Primary osteoarthritis  involving multiple joints 02/19/2019   DDD (degenerative disc disease), cervical 12/15/2018   Dyslipidemia 12/15/2018   History of asthma 12/15/2018   Elevated hemoglobin A1c 11/14/2017   Chronic neck pain 09/19/2016   Asthma 07/07/2015   Eczema 07/07/2015   Gastroesophageal reflux disease without esophagitis 07/07/2015   Restless legs syndrome 07/07/2015   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   Unspecified vitamin D deficiency 08/06/2013   Age-related osteoporosis without current pathological fracture 08/04/2012   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   Osteoporosis 08/04/2012   REFERRING PROVIDER: Iran Sizer, PA-C   REFERRING DIAG: Age-related osteoporosis with current pathological fracture, vertebra(e), subsequent encounter for fracture with routine healing   Rationale for Evaluation and Treatment: Rehabilitation  THERAPY DIAG:  Other low back pain  Muscle weakness (generalized)  ONSET DATE: December 2023  SUBJECTIVE:  SUBJECTIVE STATEMENT: States that her daughter was home this weekend so she did some cooking. Uses her back brace with home activities but can't see much difference with using it. Had a flare up of Meniere's symptoms last week and was limited with activity for two days.  PERTINENT HISTORY:  HTN, asthma, osteoporosis, and hearing loss  PAIN:  Are you having pain? Yes: NPRS scale: 6/10 Pain location: low back Pain description: discomfort Aggravating factors: standing Relieving factors: Rest    PRECAUTIONS: None  WEIGHT BEARING RESTRICTIONS: No  FALLS:  Has patient fallen in last 6 months?  No, but my balance is not good.   PATIENT GOALS: reduced pain, walk longer, improved balance, return to driving, and be able to do yard work   NEXT MD VISIT:  11/2022   OBJECTIVE:   DIAGNOSTIC FINDINGS: 05/05/22 Lumbar CT scan  IMPRESSION: 1. Acute or subacute L1 inferior endplate compression fracture with unhealed fracture lucency, 25% loss of vertebral body height. No retropulsion or complicating features.   2. Osteopenia. No other acute osseous abnormality identified. Chronic L5 pars fractures.   3. Lumbar spine degeneration.  Gastric hiatal hernia.  POSTURE: rounded shoulders, forward head, decreased lumbar lordosis, and increased thoracic kyphosis  PALPATION: TTP: bilateral QL and obliques  JOINT MOBILITY:   Lumbar: hypomobile with L1-2 reproducing familiar pain  LUMBAR ROM:   AROM eval  Flexion 28  Extension 8; increased pain, and minimal L5-S1 mobility  Right lateral flexion 50% limited  Left lateral flexion 50% limited; familiar pain  Right rotation 50% limited; familiar pain   Left rotation 50% limited; familiar pain   (Blank rows = not tested)  LOWER EXTREMITY ROM: WFL for activities assessed  LOWER EXTREMITY MMT:    MMT Right eval Left eval  Hip flexion 4-/5 3+/5  Hip extension    Hip abduction    Hip adduction    Hip internal rotation    Hip external rotation    Knee flexion 4/5 4/5  Knee extension 4+/5 5/5  Ankle dorsiflexion 4/5 4/5  Ankle plantarflexion    Ankle inversion    Ankle eversion     (Blank rows = not tested)  FUNCTIONAL TESTS:  5 times sit to stand: 16.06 seconds w/o UE support Timed up and go (TUG): 13.15 seconds w/o assistive device  GAIT: Assistive device utilized: Quad cane large base Level of assistance: Modified independence Comments: decreased gait speed  TODAY'S TREATMENT:                                                                                                                              DATE:      6/17 EXERCISE LOG  Exercise Repetitions and Resistance Comments  Nustep  L3 x 15 minutes   Resisted pull down  Blue XTS 20 reps    Hip flexion X20 reps with pause    Mini squat X20 reps Tactile, demo cues required to improve technique  Wall  pushups X15 reps   LAQ 4# LLE x20 reps, x10 reps RLE RLE stopped due to knee pain   Blank cell = exercise not performed today   Modalities:   Date: 10/22/22 Unattended Estim: Lumbar, Pre-Mod, 15 mins, Pain Hot Pack: Lumbar, 15 mins, Pain  PATIENT EDUCATION:  Education details: stand up weeding tool Person educated: Patient Education method: Explanation and Handouts Education comprehension: verbalized understanding and returned demonstration  HOME EXERCISE PROGRAM: [id number]  ASSESSMENT:  CLINICAL IMPRESSION: Patient presented in clinic with reports of increased LBP and reports increased pain with any activity at home. Patient also reports using the back brace as provided by MD but has not seen any improvement. Patient progressed with AB bracing during therex session for all exercises. Patient requested to stop LAQ with RLE due to R knee pain. Patient still experiencing Meniere's flare ups which leave her incapacitated and basically bed bound for a few days. Normal modalities response noted following removal of the modalities.   OBJECTIVE IMPAIRMENTS: decreased activity tolerance, decreased balance, decreased mobility, difficulty walking, decreased ROM, decreased strength, hypomobility, impaired tone, postural dysfunction, and pain.   ACTIVITY LIMITATIONS: carrying, lifting, bending, standing, and locomotion level  PARTICIPATION LIMITATIONS: meal prep, cleaning, laundry, driving, shopping, community activity, and yard work  PERSONAL FACTORS: Age, Time since onset of injury/illness/exacerbation, and 3+ comorbidities: HTN, asthma, osteoporosis, and hearing loss  are also affecting patient's functional outcome.   REHAB POTENTIAL: Good  CLINICAL DECISION MAKING: Evolving/moderate complexity  EVALUATION COMPLEXITY: Moderate  GOALS: Goals reviewed with patient? Yes  SHORT TERM GOALS: Target date:  08/23/22  Patient will be independent with her initial HEP.  Baseline: Goal status: MET  2.  Patient will be able to complete her daily activities without her familiar pain exceeding 6/10. Baseline: 4/11: up to a 7/10 at times when standing Goal status: PARTIALLY MET  3.  Patient be able to demonstrate proper lifting mechanics for improved safety picking up items from the floor. Baseline:  Goal status: IN PROGRESS  LONG TERM GOALS: Target date: 09/13/22  Patient will be independent with her advanced HEP. Baseline:  Goal status: IN PROGRESS  2.  Patient will be able to complete her daily activities without her familiar pain exceeding 4/10. Baseline:  Goal status: IN PROGRESS  3.  Patient will improve her 5 times sit to stand to 13 seconds or less for improved lower extremity power. Baseline: 4/11: 9.9 seconds  Goal status: MET  4.  Patient will be able to safely ambulate at least 80 feet without an assistive device for improved household independence. Baseline:  Goal status: MET  5.  Patient will report being able to do her yard work without being limited by her familiar low back pain. Baseline: 4/11: "may try this weekend" Goal status: PARTIALLY MET  6.  Patient will be able to walk for at least 45 minutes without being limited by her familiar low back pain. Baseline: 4/11: 30-35 minutes Goal status: IN PROGRESS  PLAN:  PT FREQUENCY: 2x/week  PT DURATION: 6 weeks  PLANNED INTERVENTIONS: Therapeutic exercises, Therapeutic activity, Neuromuscular re-education, Balance training, Gait training, Patient/Family education, Self Care, Joint mobilization, Stair training, Electrical stimulation, Spinal mobilization, Cryotherapy, Moist heat, Manual therapy, and Re-evaluation.  PLAN FOR NEXT SESSION: Progress lumbar and core strengthening as indicated.  Marvell Fuller, PTA 10/22/2022, 10:20 AM

## 2022-10-26 NOTE — Progress Notes (Signed)
Office Visit Note  Patient: Megan Logan             Date of Birth: [birth year]           MRN: [id number]             PCP: Barbie Banner, MD Referring: Barbie Banner, MD Visit Date: 11/09/2022 Occupation: @GUAROCC @  Subjective:  Medication monitoring   History of Present Illness: Megan Logan is a 82 y.o. female with history of osteoporosis and osteoarthritis.  Patient received her last Prolia injection on 09/26/2022.  She continues to tolerate Prolia without any side effects.  Patient states that Dr. Burgess Estelle her dentist does not recommend starting Forteo until after she has had dental implants completed.  According to the patient she is scheduled to have dental implant in November 2024.  She will notify us once she has been cleared to initiate Forteo.  She has been taking vitamin D 2000 units daily and calcium 600 mg twice daily.  She denies any recent falls.  She continues to have significant lower back pain.  She is unable to stand for longer than 30 minutes.  She wears a back brace in the mornings and with certain activities for added support.  She has has been going to physical therapy as well as has been trying to perform home exercises.  She is also tried walking some for exercise.  She has an upcoming appointment scheduled at [facility] to discuss the next step for management of her lower back pain.  Patient states she is also been having increased discomfort in both hands especially her left hand.  She has also noticed some intermittent paresthesias in her left hand.  She is not having to sleep in a recliner and is not sure if the positioning at night is contributing to her symptoms.     Activities of Daily Living:  Patient reports morning stiffness for 1 hour.   Patient Reports nocturnal pain.  Difficulty dressing/grooming: Denies Difficulty climbing stairs: Denies Difficulty getting out of chair: Reports Difficulty using hands for taps, buttons, cutlery, and/or  writing: Reports  Review of Systems  Constitutional:  Positive for fatigue.  HENT:  Positive for mouth dryness. Negative for mouth sores.   Eyes:  Negative for dryness.  Respiratory:  Positive for shortness of breath.   Cardiovascular:  Negative for chest pain and palpitations.  Gastrointestinal:  Negative for blood in stool, constipation and diarrhea.  Endocrine: Negative for increased urination.  Genitourinary:  Negative for involuntary urination.  Musculoskeletal:  Positive for joint pain, joint pain, joint swelling and morning stiffness. Negative for gait problem, myalgias, muscle weakness, muscle tenderness and myalgias.  Skin:  Positive for rash and hair loss. Negative for color change and sensitivity to sunlight.  Allergic/Immunologic: Negative for susceptible to infections.  Neurological:  Positive for dizziness, numbness and parasthesias. Negative for headaches.  Hematological:  Negative for swollen glands.  Psychiatric/Behavioral:  Positive for sleep disturbance. Negative for depressed mood. The patient is not nervous/anxious.     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   Volume depletion, gastrointestinal loss 12/04/2019   Hyponatremia 12/03/2019   Dizziness 12/03/2019   Ketonuria 12/03/2019   Metabolic acidosis 12/03/2019   Chronic rhinitis 02/19/2019   Primary osteoarthritis involving multiple joints 02/19/2019   DDD (degenerative disc disease), cervical 12/15/2018   Dyslipidemia 12/15/2018  History of asthma 12/15/2018   Elevated hemoglobin A1c 11/14/2017   Chronic neck pain 09/19/2016   Asthma 07/07/2015   Eczema 07/07/2015   Gastroesophageal reflux disease without esophagitis 07/07/2015   Restless legs syndrome 07/07/2015   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   Unspecified vitamin D deficiency 08/06/2013   Age-related osteoporosis  without current pathological fracture 08/04/2012   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   Osteoporosis 08/04/2012    Past Medical History:  Diagnosis Date   Adrenal insufficiency (HCC)    Anemia    diet related   Asthma    Atrophic vaginitis    Colitis    Fracture of L1 vertebra (HCC)    Hx of endometriosis    took Danacrine   Hypercholesterolemia    Hypertension    Osteoporosis    spine off Actonel sinsce 09/2010- Drug [facility]   PONV (postoperative nausea and vomiting)    Premature ovarian failure age 55   HRT age 74 - 12/2000    Family History  Problem Relation Age of Onset   Prostate cancer Father 68   Alzheimer's disease Mother 32   Other Sister        dizziness   Cancer Sister 77       Brain and Lung   Ulcerative colitis Sister    Migraines Sister    Cancer - Colon Maternal Grandfather 51   Irritable bowel syndrome Daughter    Colitis Daughter    Past Surgical History:  Procedure Laterality Date   CATARACT EXTRACTION W/PHACO Left 05/15/2021   Procedure: CATARACT EXTRACTION PHACO AND INTRAOCULAR LENS PLACEMENT (IOC);  Surgeon: Fabio Pierce, MD;  Location: [facility];  Service: Ophthalmology;  Laterality: Left;  CDE: 18.54   CATARACT EXTRACTION W/PHACO Right 05/29/2021   Procedure: CATARACT EXTRACTION PHACO AND INTRAOCULAR LENS PLACEMENT (IOC);  Surgeon: Fabio Pierce, MD;  Location: [facility];  Service: Ophthalmology;  Laterality: Right;  CDE: 14.51   HYSTEROSCOPY  1996   DUB--wnl   NASAL SINUS SURGERY  40's and 50's   TUBAL LIGATION Bilateral 1982   Social History   Social History Narrative   Not on file   Immunization History  Administered Date(s) Administered   Covid-19, Mrna,Vaccine(Spikevax)36yrs and older 01/28/2022   Fluad Quad(high Dose 65+) 01/15/2019   Influenza Split 02/24/2017   Influenza, High Dose Seasonal PF 02/26/2014, 02/24/2017, 02/24/2018, 01/15/2019, 02/11/2020   Influenza,inj,Quad PF,6+ Mos 02/26/2014   Influenza,inj,quad, With  Preservative 02/04/2017   Moderna Sars-Covid-2 Vaccination 05/19/2019, 06/17/2019, 02/29/2020, 08/10/2020   Pfizer Covid-19 Vaccine Bivalent Booster 5yrs & up 09/12/2021   Pneumococcal Conjugate-13 05/29/2015   Pneumococcal Polysaccharide-23 03/04/2013   Tdap 09/29/2015   Zoster Recombinant(Shingrix) 08/26/2017, 11/10/2017   Zoster, Live 05/07/2007, 08/26/2017, 11/10/2017     Objective: Vital Signs: BP 120/76 (BP Location: Left Arm, Patient Position: Sitting, Cuff Size: Normal)   Pulse 76   Resp 13   Ht 5' (1.524 m)   Wt 106 lb 3.2 oz (48.2 kg)   LMP 05/07/1982 (Approximate)   BMI 20.74 kg/m    Physical Exam Vitals and nursing note reviewed.  Constitutional:      Appearance: She is well-developed.  HENT:     Head: Normocephalic and atraumatic.  Eyes:     Conjunctiva/sclera: Conjunctivae normal.  Cardiovascular:     Rate and Rhythm: Normal rate and regular rhythm.     Heart sounds: Normal heart sounds.  Pulmonary:     Effort: Pulmonary effort is normal.     Breath sounds: Normal breath sounds.  Abdominal:     General: Bowel sounds are normal.     Palpations: Abdomen is soft.  Musculoskeletal:     Cervical back: Normal range of motion.  Lymphadenopathy:     Cervical: No cervical adenopathy.  Skin:    General: Skin is warm and dry.     Capillary Refill: Capillary refill takes less than 2 seconds.  Neurological:     Mental Status: She is alert and oriented to person, place, and time.  Psychiatric:        Behavior: Behavior normal.      Musculoskeletal Exam: C-spine has limited range of motion with lateral rotation.  Painful range of motion of the lumbar spine.  Shoulder joints, elbow joints, wrist joints, MCPs, PIPs, DIPs have good range of motion with no synovitis.  PIP and DIP thickening consistent with osteoarthritis of both hands.  CMC joint thickening and subluxation bilaterally.  Tenderness over the left first PIP joint and the left third PIP joint.  No  tenderness or synovitis over MCP joints.  Hip joints have good range of motion with no groin pain.  Knee joints have good range of motion with no warmth or effusion.  Ankle joints have good range of motion with no tenderness or joint swelling.  CDAI Exam: CDAI Score: -- Patient Global: --; Provider Global: -- Swollen: --; Tender: -- Joint Exam 11/09/2022   No joint exam has been documented for this visit   There is currently no information documented on the homunculus. Go to the Rheumatology activity and complete the homunculus joint exam.  Investigation: No additional findings.  Imaging: No results found.  Recent Labs: Lab Results  Component Value Date   WBC 7.7 01/23/2022   HGB 11.7 01/23/2022   PLT 285 01/23/2022   NA 138 07/31/2022   K 4.4 07/31/2022   CL 102 07/31/2022   CO2 29 07/31/2022   GLUCOSE 100 (H) 07/31/2022   BUN 23 07/31/2022   CREATININE 0.73 07/31/2022   BILITOT 0.5 07/31/2022   ALKPHOS 57 01/23/2022   AST 21 07/31/2022   ALT 15 07/31/2022   PROT 6.1 07/31/2022   ALBUMIN 4.3 01/23/2022   CALCIUM 9.4 07/31/2022   GFRAA 87 07/14/2020    Speciality Comments: Prolia: 12/24/18, 06/26/19, 12/31/19, 07/14/20, 01/14/21  Procedures:  No procedures performed Allergies: Cephalexin, Erythromycin base, Hydrocodone bit-homatrop mbr, Levofloxacin, Penicillins, Sulfamethoxazole-trimethoprim, and Augmentin [amoxicillin-pot clavulanate]     Assessment / Plan:     Visit Diagnoses: Age-related osteoporosis without current pathological fracture -  Previous DEXA 05/04/20: BMD as determined from [facility] L1-L4 (L2,L3) is 0.820 g/cm2 with a T-Score of -2.9-Osteoporosis.  Updated DEXA 08/17/2022 The BMD measured at [facility] Neck Left is 0.704 g/cm2 with a T-score of-2.4. 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 History of GERD. Previous therapy includes Actonel --discontinued due to severe GERD.   Prolia was initiated on 12/24/2018--no side  effects or interruptions in therapy thus far.  Of note the patient was found to have an L1 compression fracture on lumbar CT from 05/05/2022.   Patient's last Prolia injection was administered on 09/26/2022. At her last office visit on 08/09/2022 the plan was to proceed with Forteo or Evenity as combination therapy.  According to the patient her dentist Dr. Burgess Estelle does not recommend the use of Forteo until after she has had a dental implant performed in November 2024.  We have recommended the use of initiating Forteo due to the recent vertebral fracture despite being on Prolia.  She plans on following Dr. Huel Coventry guidance at this time.  She will notify us when she has been cleared to initiate Forteo.  She will remain on vitamin D 2000 units daily and calcium 600 mg twice daily.  She will follow-up in the office in 4 months and we will rediscuss combination therapy at that time.  Medication monitoring encounter - Previously on Actonel but discontinued due to severe gastroesophageal reflux. She was started on Prolia on 12/24/2018. Prolia-last injection administered on 09/26/22.  Plan to initiate forteo after dental implants are complete ~November 2024.   Vitamin D deficiency: She is taking vitamin D 2000 units daily.  Primary osteoarthritis of both hands: She has severe CMC, PIP, DIP thickening consistent with osteoarthritis of both hands.  Tenderness over the left first and third PIP joints noted today.  No dactylitis noted.  No tenderness or synovitis over MCP joints. Discussed the importance of joint protection and muscle strengthening. Patient was given a handout of hand exercises to perform.  Discussed the use of Voltaren gel which she can buy topically as needed for pain relief.  Primary osteoarthritis of right knee: She has good range of motion of the right knee joint on examination today.  No warmth or effusion noted.  Mild crepitus noted.  DDD (degenerative disc disease), cervical - Dr. Ethelene Hal:  Limited range of motion with lateral rotation.  History of vertebral fracture - CT of the lumbar spine 05/05/2022: Acute or subacute L1 inferior endplate compression fracture with unhealed fracture lucency, 25% loss of vertebral body height.  She continues to have significant discomfort in her lower back.  She will be completing PT on Monday.  She plans to continue home exercises.  She continues to use a back brace pursing in the morning as well as with certain activities which she finds to be helpful. She is scheduled for a follow-up visit at [facility].  Paresthesia of hand-Left hand-Positive Phalen's test.  She is been experiencing intermittent paresthesias in the left hand.  It is unclear if the symptoms are coming from her neck or due to carpal tunnel syndrome but the paresthesias are primarily affecting her left thumb, index, and middle finger.  Discussed that nerve conduction study could be helpful but she would like to hold off at this time.  Discussed the use of a carpal tunnel night splint as a trial which she is considering.  She will notify us if her symptoms persist or worsen.  Other medical conditions are listed as follows:  Adrenal insufficiency (HCC) - hydrocortisone 10 mg twice daily.  Dyslipidemia  History of gastroesophageal reflux (GERD)  History of asthma  Premature ovarian failure  Orders: No orders of the defined types were placed in this encounter.  No orders of the defined types were placed in this encounter.   Follow-Up Instructions: Return in about 4 months (around 03/12/2023).   Gearldine Bienenstock, PA-C  Note - This record has been created using Dragon software.  Chart creation errors have been sought, but may not always  have been located. Such creation errors do not reflect on  the standard of medical care.

## 2022-10-29 ENCOUNTER — Encounter: Payer: Self-pay | Admitting: Physical Therapy

## 2022-10-29 ENCOUNTER — Ambulatory Visit: Payer: Medicare PPO | Admitting: Physical Therapy

## 2022-10-29 DIAGNOSIS — M6281 Muscle weakness (generalized): Secondary | ICD-10-CM

## 2022-10-29 DIAGNOSIS — M5459 Other low back pain: Secondary | ICD-10-CM | POA: Diagnosis not present

## 2022-10-29 NOTE — Therapy (Signed)
OUTPATIENT PHYSICAL THERAPY THORACOLUMBAR TREATMENT   Patient Name: Megan Logan MRN: [id number] DOB:1941/02/07, 82 y.o., female Today's Date: 10/29/2022  END OF SESSION:  PT End of Session - 10/29/22 0936     Visit Number 16    Number of Visits 18    Date for PT Re-Evaluation 11/02/22    PT Start Time 0936    PT Stop Time 1015    PT Time Calculation (min) 39 min    Activity Tolerance Patient tolerated treatment well    Behavior During Therapy [facility] for tasks assessed/performed            Past Medical History:  Diagnosis Date   Adrenal insufficiency (HCC)    Anemia    diet related   Asthma    Atrophic vaginitis    Colitis    Fracture of L1 vertebra (HCC)    Hx of endometriosis    took Danacrine   Hypercholesterolemia    Hypertension    Osteoporosis    spine off Actonel sinsce 09/2010- [facility]   PONV (postoperative nausea and vomiting)    Premature ovarian failure age 61   HRT age 53 - 12/2000   Past Surgical History:  Procedure Laterality Date   CATARACT EXTRACTION W/PHACO Left 05/15/2021   Procedure: CATARACT EXTRACTION PHACO AND INTRAOCULAR LENS PLACEMENT (IOC);  Surgeon: Fabio Pierce, MD;  Location: [facility];  Service: Ophthalmology;  Laterality: Left;  CDE: 18.54   CATARACT EXTRACTION W/PHACO Right 05/29/2021   Procedure: CATARACT EXTRACTION PHACO AND INTRAOCULAR LENS PLACEMENT (IOC);  Surgeon: Fabio Pierce, MD;  Location: [facility];  Service: Ophthalmology;  Laterality: Right;  CDE: 14.51   HYSTEROSCOPY  1996   DUB--wnl   NASAL SINUS SURGERY  40's and 50's   TUBAL LIGATION Bilateral 1982   Patient Active Problem List   Diagnosis Date Noted   Essential hypertension 02/29/2020   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   Volume depletion, gastrointestinal loss 12/04/2019   Hyponatremia 12/03/2019   Dizziness 12/03/2019   Ketonuria 12/03/2019   Metabolic acidosis 12/03/2019   Chronic rhinitis 02/19/2019   Primary osteoarthritis  involving multiple joints 02/19/2019   DDD (degenerative disc disease), cervical 12/15/2018   Dyslipidemia 12/15/2018   History of asthma 12/15/2018   Elevated hemoglobin A1c 11/14/2017   Chronic neck pain 09/19/2016   Asthma 07/07/2015   Eczema 07/07/2015   Gastroesophageal reflux disease without esophagitis 07/07/2015   Restless legs syndrome 07/07/2015   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   Unspecified vitamin D deficiency 08/06/2013   Age-related osteoporosis without current pathological fracture 08/04/2012   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   Osteoporosis 08/04/2012   REFERRING PROVIDER: Iran Sizer, PA-C   REFERRING DIAG: Age-related osteoporosis with current pathological fracture, vertebra(e), subsequent encounter for fracture with routine healing   Rationale for Evaluation and Treatment: Rehabilitation  THERAPY DIAG:  Other low back pain  Muscle weakness (generalized)  ONSET DATE: December 2023  SUBJECTIVE:  SUBJECTIVE STATEMENT: Reports that she feels pretty good today but while she was up her pain was 6/10.   PERTINENT HISTORY:  HTN, asthma, osteoporosis, and hearing loss  PAIN:  Are you having pain? Yes: NPRS scale: 6/10 Pain location: low back Pain description: discomfort Aggravating factors: standing Relieving factors: Rest    PRECAUTIONS: None  WEIGHT BEARING RESTRICTIONS: No  FALLS:  Has patient fallen in last 6 months?  No, but my balance is not good.   PATIENT GOALS: reduced pain, walk longer, improved balance, return to driving, and be able to do yard work   NEXT MD VISIT: 11/2022   OBJECTIVE:   DIAGNOSTIC FINDINGS: 05/05/22 Lumbar CT scan  IMPRESSION: 1. Acute or subacute L1 inferior endplate compression fracture with unhealed fracture  lucency, 25% loss of vertebral body height. No retropulsion or complicating features.   2. Osteopenia. No other acute osseous abnormality identified. Chronic L5 pars fractures.   3. Lumbar spine degeneration.  Gastric hiatal hernia.  POSTURE: rounded shoulders, forward head, decreased lumbar lordosis, and increased thoracic kyphosis  PALPATION: TTP: bilateral QL and obliques  JOINT MOBILITY:   Lumbar: hypomobile with L1-2 reproducing familiar pain  LUMBAR ROM:   AROM eval  Flexion 28  Extension 8; increased pain, and minimal L5-S1 mobility  Right lateral flexion 50% limited  Left lateral flexion 50% limited; familiar pain  Right rotation 50% limited; familiar pain   Left rotation 50% limited; familiar pain   (Blank rows = not tested)  LOWER EXTREMITY ROM: WFL for activities assessed  LOWER EXTREMITY MMT:    MMT Right eval Left eval  Hip flexion 4-/5 3+/5  Hip extension    Hip abduction    Hip adduction    Hip internal rotation    Hip external rotation    Knee flexion 4/5 4/5  Knee extension 4+/5 5/5  Ankle dorsiflexion 4/5 4/5  Ankle plantarflexion    Ankle inversion    Ankle eversion     (Blank rows = not tested)  FUNCTIONAL TESTS:  5 times sit to stand: 16.06 seconds w/o UE support Timed up and go (TUG): 13.15 seconds w/o assistive device  GAIT: Assistive device utilized: Quad cane large base and None Level of assistance: Complete Independence Comments: decreased gait speed  TODAY'S TREATMENT:                                                                                                                              DATE:      6/24 EXERCISE LOG  Exercise Repetitions and Resistance Comments  Nustep  L3 x 15 minutes   Resisted pull down  Blue XTS 30 reps    Hip flexion X20 reps with pause With core activation  Mini squat X20 reps demo cues required to improve technique  Wall pushups X15 reps   LAQ 3# BLE x20 reps each   Seated clam Red theraband x20  reps    Blank cell =  exercise not performed today   Modalities:   Date: 10/29/22 Unattended Estim: Lumbar, Pre-Mod, 10 mins, Pain Hot Pack: Lumbar, 10 mins, Pain  PATIENT EDUCATION:  Education details: stand up weeding tool Person educated: Patient Education method: Explanation and Handouts Education comprehension: verbalized understanding and returned demonstration  HOME EXERCISE PROGRAM: [id number]  ASSESSMENT:  CLINICAL IMPRESSION: Patient presented in clinic with reports of mod LBP with activity and trying to use lumbar brace with activity as well. Patient notes a mild increase of standing or walking time with brace but no drastic improvement with pain. Patient able to tolerate all therex fairly well but continued correction required for mini squats. Less ankleweights for LAQ today for ease of completion and avoid knee pain. Patient states that she is still sleeping in her recliner but is very stiff when she wakes up. Patient notes that she has looked into an adjustable bed. Normal modalities response noted following removal of the modalities.  OBJECTIVE IMPAIRMENTS: decreased activity tolerance, decreased balance, decreased mobility, difficulty walking, decreased ROM, decreased strength, hypomobility, impaired tone, postural dysfunction, and pain.   ACTIVITY LIMITATIONS: carrying, lifting, bending, standing, and locomotion level  PARTICIPATION LIMITATIONS: meal prep, cleaning, laundry, driving, shopping, community activity, and yard work  PERSONAL FACTORS: Age, Time since onset of injury/illness/exacerbation, and 3+ comorbidities: HTN, asthma, osteoporosis, and hearing loss  are also affecting patient's functional outcome.   REHAB POTENTIAL: Good  CLINICAL DECISION MAKING: Evolving/moderate complexity  EVALUATION COMPLEXITY: Moderate  GOALS: Goals reviewed with patient? Yes  SHORT TERM GOALS: Target date: 08/23/22  Patient will be independent with her initial HEP.   Baseline: Goal status: MET  2.  Patient will be able to complete her daily activities without her familiar pain exceeding 6/10. Baseline: 4/11: up to a 7/10 at times when standing Goal status: PARTIALLY MET  3.  Patient be able to demonstrate proper lifting mechanics for improved safety picking up items from the floor. Baseline:  Goal status: IN PROGRESS  LONG TERM GOALS: Target date: 09/13/22  Patient will be independent with her advanced HEP. Baseline:  Goal status: IN PROGRESS  2.  Patient will be able to complete her daily activities without her familiar pain exceeding 4/10. Baseline:  Goal status: IN PROGRESS  3.  Patient will improve her 5 times sit to stand to 13 seconds or less for improved lower extremity power. Baseline: 4/11: 9.9 seconds  Goal status: MET  4.  Patient will be able to safely ambulate at least 80 feet without an assistive device for improved household independence. Baseline:  Goal status: MET  5.  Patient will report being able to do her yard work without being limited by her familiar low back pain. Baseline: 4/11: "may try this weekend" Goal status: PARTIALLY MET  6.  Patient will be able to walk for at least 45 minutes without being limited by her familiar low back pain. Baseline: 4/11: 30-35 minutes Goal status: IN PROGRESS  PLAN:  PT FREQUENCY: 2x/week  PT DURATION: 6 weeks  PLANNED INTERVENTIONS: Therapeutic exercises, Therapeutic activity, Neuromuscular re-education, Balance training, Gait training, Patient/Family education, Self Care, Joint mobilization, Stair training, Electrical stimulation, Spinal mobilization, Cryotherapy, Moist heat, Manual therapy, and Re-evaluation.  PLAN FOR NEXT SESSION: Progress lumbar and core strengthening as indicated.  Marvell Fuller, PTA 10/29/2022, 11:53 AM

## 2022-11-05 ENCOUNTER — Encounter: Payer: Self-pay | Admitting: Physical Therapy

## 2022-11-05 ENCOUNTER — Ambulatory Visit: Payer: Medicare PPO | Attending: Physician Assistant | Admitting: Physical Therapy

## 2022-11-05 DIAGNOSIS — M6281 Muscle weakness (generalized): Secondary | ICD-10-CM | POA: Insufficient documentation

## 2022-11-05 DIAGNOSIS — M5459 Other low back pain: Secondary | ICD-10-CM | POA: Diagnosis present

## 2022-11-05 NOTE — Addendum Note (Signed)
Addended by: Candi Leash C on: 11/05/2022 03:00 PM   Modules accepted: Orders

## 2022-11-05 NOTE — Therapy (Addendum)
OUTPATIENT PHYSICAL THERAPY THORACOLUMBAR TREATMENT   Patient Name: Megan Logan MRN: [id number] DOB:1940/07/26, 82 y.o., female Today's Date: 11/05/2022  END OF SESSION:  PT End of Session - 11/05/22 0937     Visit Number 17    Number of Visits 18    Date for PT Re-Evaluation 11/02/22    PT Start Time 0930    PT Stop Time 1019    PT Time Calculation (min) 49 min    Activity Tolerance Patient tolerated treatment well    Behavior During Therapy [facility] for tasks assessed/performed            Past Medical History:  Diagnosis Date   Adrenal insufficiency (HCC)    Anemia    diet related   Asthma    Atrophic vaginitis    Colitis    Fracture of L1 vertebra (HCC)    Hx of endometriosis    took Danacrine   Hypercholesterolemia    Hypertension    Osteoporosis    spine off Actonel sinsce 09/2010- [facility]   PONV (postoperative nausea and vomiting)    Premature ovarian failure age 86   HRT age 54 - 12/2000   Past Surgical History:  Procedure Laterality Date   CATARACT EXTRACTION W/PHACO Left 05/15/2021   Procedure: CATARACT EXTRACTION PHACO AND INTRAOCULAR LENS PLACEMENT (IOC);  Surgeon: Fabio Pierce, MD;  Location: [facility];  Service: Ophthalmology;  Laterality: Left;  CDE: 18.54   CATARACT EXTRACTION W/PHACO Right 05/29/2021   Procedure: CATARACT EXTRACTION PHACO AND INTRAOCULAR LENS PLACEMENT (IOC);  Surgeon: Fabio Pierce, MD;  Location: [facility];  Service: Ophthalmology;  Laterality: Right;  CDE: 14.51   HYSTEROSCOPY  1996   DUB--wnl   NASAL SINUS SURGERY  40's and 50's   TUBAL LIGATION Bilateral 1982   Patient Active Problem List   Diagnosis Date Noted   Essential hypertension 02/29/2020   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   Volume depletion, gastrointestinal loss 12/04/2019   Hyponatremia 12/03/2019   Dizziness 12/03/2019   Ketonuria 12/03/2019   Metabolic acidosis 12/03/2019   Chronic rhinitis 02/19/2019   Primary osteoarthritis  involving multiple joints 02/19/2019   DDD (degenerative disc disease), cervical 12/15/2018   Dyslipidemia 12/15/2018   History of asthma 12/15/2018   Elevated hemoglobin A1c 11/14/2017   Chronic neck pain 09/19/2016   Asthma 07/07/2015   Eczema 07/07/2015   Gastroesophageal reflux disease without esophagitis 07/07/2015   Restless legs syndrome 07/07/2015   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   Unspecified vitamin D deficiency 08/06/2013   Age-related osteoporosis without current pathological fracture 08/04/2012   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   Osteoporosis 08/04/2012   REFERRING PROVIDER: Iran Sizer, PA-C   REFERRING DIAG: Age-related osteoporosis with current pathological fracture, vertebra(e), subsequent encounter for fracture with routine healing   Rationale for Evaluation and Treatment: Rehabilitation  THERAPY DIAG:  Other low back pain  Muscle weakness (generalized)  ONSET DATE: December 2023  SUBJECTIVE:  SUBJECTIVE STATEMENT: Reports that she feels pretty good today but has already done some  things at home prior to PT today. States noticing more L LBP.  PERTINENT HISTORY:  HTN, asthma, osteoporosis, and hearing loss  PAIN:  Are you having pain? Yes: NPRS scale: 2-3/10 Pain location: low back Pain description: discomfort Aggravating factors: standing Relieving factors: Rest    PRECAUTIONS: None  WEIGHT BEARING RESTRICTIONS: No  FALLS:  Has patient fallen in last 6 months?  No, but my balance is not good.   PATIENT GOALS: reduced pain, walk longer, improved balance, return to driving, and be able to do yard work   NEXT MD VISIT: 11/2022   OBJECTIVE:   DIAGNOSTIC FINDINGS: 05/05/22 Lumbar CT scan  IMPRESSION: 1. Acute or subacute L1 inferior  endplate compression fracture with unhealed fracture lucency, 25% loss of vertebral body height. No retropulsion or complicating features.   2. Osteopenia. No other acute osseous abnormality identified. Chronic L5 pars fractures.   3. Lumbar spine degeneration.  Gastric hiatal hernia.  POSTURE: rounded shoulders, forward head, decreased lumbar lordosis, and increased thoracic kyphosis  PALPATION: TTP: bilateral QL and obliques  JOINT MOBILITY:   Lumbar: hypomobile with L1-2 reproducing familiar pain  LUMBAR ROM:   AROM eval  Flexion 28  Extension 8; increased pain, and minimal L5-S1 mobility  Right lateral flexion 50% limited  Left lateral flexion 50% limited; familiar pain  Right rotation 50% limited; familiar pain   Left rotation 50% limited; familiar pain   (Blank rows = not tested)  LOWER EXTREMITY ROM: WFL for activities assessed  LOWER EXTREMITY MMT:    MMT Right eval Left eval  Hip flexion 4-/5 3+/5  Hip extension    Hip abduction    Hip adduction    Hip internal rotation    Hip external rotation    Knee flexion 4/5 4/5  Knee extension 4+/5 5/5  Ankle dorsiflexion 4/5 4/5  Ankle plantarflexion    Ankle inversion    Ankle eversion     (Blank rows = not tested)  FUNCTIONAL TESTS:  5 times sit to stand: 16.06 seconds w/o UE support Timed up and go (TUG): 13.15 seconds w/o assistive device  GAIT: Assistive device utilized: Quad cane large base and None Level of assistance: Complete Independence Comments: decreased gait speed  TODAY'S TREATMENT:                                                                                                                              DATE:      11/05/22 EXERCISE LOG  Exercise Repetitions and Resistance Comments  Nustep  L3 x 15 minutes 6/10 L LBP  Resisted pull down  Blue XTS 30 reps    Mod plank arm raise X15 reps   Mod plank leg raise X15 reps   Seated horizontal abduction X20 reps red theraband Bolster for postural  support  Seated ER Red theraband x20 reps Bolster for postural  support  LAQ 3# BLE x20 reps each Bolster for postural support  Seated clam Red theraband x20 reps Bolster for postural support   Blank cell = exercise not performed today   Modalities:   Date: 11/05/22 Unattended Estim: Lumbar, Pre-Mod, 10 mins, Pain  PATIENT EDUCATION:  Education details: stand up weeding tool Person educated: Patient Education method: Explanation and Handouts Education comprehension: verbalized understanding and returned demonstration  HOME EXERCISE PROGRAM: [id number]  ASSESSMENT:  CLINICAL IMPRESSION: Patient presented in clinic with reports of mild pain on arrival. Patient using brace but still limited with ADLs due to pain. Patient progressed to more standing stabilization exercises as well as postural bolster for correction in sitting. Normal stimulation response noted following removal of the modality.  OBJECTIVE IMPAIRMENTS: decreased activity tolerance, decreased balance, decreased mobility, difficulty walking, decreased ROM, decreased strength, hypomobility, impaired tone, postural dysfunction, and pain.   ACTIVITY LIMITATIONS: carrying, lifting, bending, standing, and locomotion level  PARTICIPATION LIMITATIONS: meal prep, cleaning, laundry, driving, shopping, community activity, and yard work  PERSONAL FACTORS: Age, Time since onset of injury/illness/exacerbation, and 3+ comorbidities: HTN, asthma, osteoporosis, and hearing loss  are also affecting patient's functional outcome.   REHAB POTENTIAL: Good  CLINICAL DECISION MAKING: Evolving/moderate complexity  EVALUATION COMPLEXITY: Moderate  GOALS: Goals reviewed with patient? Yes  SHORT TERM GOALS: Target date: 08/23/22  Patient will be independent with her initial HEP.  Baseline: Goal status: MET  2.  Patient will be able to complete her daily activities without her familiar pain exceeding 6/10. Baseline: 4/11: up to a 7/10 at  times when standing Goal status: PARTIALLY MET  3.  Patient be able to demonstrate proper lifting mechanics for improved safety picking up items from the floor. Baseline:  Goal status: IN PROGRESS  LONG TERM GOALS: Target date: 09/13/22  Patient will be independent with her advanced HEP. Baseline:  Goal status: IN PROGRESS  2.  Patient will be able to complete her daily activities without her familiar pain exceeding 4/10. Baseline:  Goal status: IN PROGRESS  3.  Patient will improve her 5 times sit to stand to 13 seconds or less for improved lower extremity power. Baseline: 4/11: 9.9 seconds  Goal status: MET  4.  Patient will be able to safely ambulate at least 80 feet without an assistive device for improved household independence. Baseline:  Goal status: MET  5.  Patient will report being able to do her yard work without being limited by her familiar low back pain. Baseline: 4/11: "may try this weekend" Goal status: PARTIALLY MET  6.  Patient will be able to walk for at least 45 minutes without being limited by her familiar low back pain. Baseline: 4/11: 30-35 minutes Goal status: IN PROGRESS  PLAN:  PT FREQUENCY: 2x/week  PT DURATION: 6 weeks  PLANNED INTERVENTIONS: Therapeutic exercises, Therapeutic activity, Neuromuscular re-education, Balance training, Gait training, Patient/Family education, Self Care, Joint mobilization, Stair training, Electrical stimulation, Spinal mobilization, Cryotherapy, Moist heat, Manual therapy, and Re-evaluation.  PLAN FOR NEXT SESSION: Progress lumbar and core strengthening as indicated.  Marvell Fuller, PTA 11/05/2022, 10:53 AM

## 2022-11-09 ENCOUNTER — Encounter: Payer: Self-pay | Admitting: Physician Assistant

## 2022-11-09 ENCOUNTER — Ambulatory Visit: Payer: Medicare PPO | Attending: Physician Assistant | Admitting: Physician Assistant

## 2022-11-09 VITALS — BP 120/76 | HR 76 | Resp 13 | Ht 60.0 in | Wt 106.2 lb

## 2022-11-09 DIAGNOSIS — E559 Vitamin D deficiency, unspecified: Secondary | ICD-10-CM | POA: Diagnosis not present

## 2022-11-09 DIAGNOSIS — M1711 Unilateral primary osteoarthritis, right knee: Secondary | ICD-10-CM

## 2022-11-09 DIAGNOSIS — M5030 Other cervical disc degeneration, unspecified cervical region: Secondary | ICD-10-CM

## 2022-11-09 DIAGNOSIS — M19041 Primary osteoarthritis, right hand: Secondary | ICD-10-CM

## 2022-11-09 DIAGNOSIS — Z5181 Encounter for therapeutic drug level monitoring: Secondary | ICD-10-CM

## 2022-11-09 DIAGNOSIS — Z8781 Personal history of (healed) traumatic fracture: Secondary | ICD-10-CM

## 2022-11-09 DIAGNOSIS — E2740 Unspecified adrenocortical insufficiency: Secondary | ICD-10-CM

## 2022-11-09 DIAGNOSIS — M19042 Primary osteoarthritis, left hand: Secondary | ICD-10-CM

## 2022-11-09 DIAGNOSIS — M810 Age-related osteoporosis without current pathological fracture: Secondary | ICD-10-CM

## 2022-11-09 DIAGNOSIS — R202 Paresthesia of skin: Secondary | ICD-10-CM

## 2022-11-09 DIAGNOSIS — Z8719 Personal history of other diseases of the digestive system: Secondary | ICD-10-CM

## 2022-11-09 DIAGNOSIS — E785 Hyperlipidemia, unspecified: Secondary | ICD-10-CM

## 2022-11-09 DIAGNOSIS — Z8709 Personal history of other diseases of the respiratory system: Secondary | ICD-10-CM

## 2022-11-09 DIAGNOSIS — E2839 Other primary ovarian failure: Secondary | ICD-10-CM

## 2022-11-09 NOTE — Patient Instructions (Signed)

## 2022-11-12 ENCOUNTER — Encounter: Payer: Self-pay | Admitting: Physical Therapy

## 2022-11-12 ENCOUNTER — Ambulatory Visit: Payer: Medicare PPO | Admitting: Physical Therapy

## 2022-11-12 DIAGNOSIS — M5459 Other low back pain: Secondary | ICD-10-CM

## 2022-11-12 DIAGNOSIS — M6281 Muscle weakness (generalized): Secondary | ICD-10-CM

## 2022-11-12 NOTE — Therapy (Addendum)
OUTPATIENT PHYSICAL THERAPY THORACOLUMBAR TREATMENT   Patient Name: Megan Logan MRN: [id number] DOB:1940/12/09, 82 y.o., female Today's Date: 11/12/2022  END OF SESSION:  PT End of Session - 11/12/22 0933     Visit Number 18    Number of Visits 18    Date for PT Re-Evaluation 11/23/22    PT Start Time 0932    PT Stop Time 1015    PT Time Calculation (min) 43 min    Activity Tolerance Patient tolerated treatment well    Behavior During Therapy [facility] for tasks assessed/performed            Past Medical History:  Diagnosis Date   Adrenal insufficiency (HCC)    Anemia    diet related   Asthma    Atrophic vaginitis    Colitis    Fracture of L1 vertebra (HCC)    Hx of endometriosis    took Danacrine   Hypercholesterolemia    Hypertension    Osteoporosis    spine off Actonel sinsce 09/2010- [facility]   PONV (postoperative nausea and vomiting)    Premature ovarian failure age 60   HRT age 67 - 12/2000   Past Surgical History:  Procedure Laterality Date   CATARACT EXTRACTION W/PHACO Left 05/15/2021   Procedure: CATARACT EXTRACTION PHACO AND INTRAOCULAR LENS PLACEMENT (IOC);  Surgeon: Fabio Pierce, MD;  Location: [facility];  Service: Ophthalmology;  Laterality: Left;  CDE: 18.54   CATARACT EXTRACTION W/PHACO Right 05/29/2021   Procedure: CATARACT EXTRACTION PHACO AND INTRAOCULAR LENS PLACEMENT (IOC);  Surgeon: Fabio Pierce, MD;  Location: [facility];  Service: Ophthalmology;  Laterality: Right;  CDE: 14.51   HYSTEROSCOPY  1996   DUB--wnl   NASAL SINUS SURGERY  40's and 50's   TUBAL LIGATION Bilateral 1982   Patient Active Problem List   Diagnosis Date Noted   Essential hypertension 02/29/2020   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   Volume depletion, gastrointestinal loss 12/04/2019   Hyponatremia 12/03/2019   Dizziness 12/03/2019   Ketonuria 12/03/2019   Metabolic acidosis 12/03/2019   Chronic rhinitis 02/19/2019   Primary osteoarthritis  involving multiple joints 02/19/2019   DDD (degenerative disc disease), cervical 12/15/2018   Dyslipidemia 12/15/2018   History of asthma 12/15/2018   Elevated hemoglobin A1c 11/14/2017   Chronic neck pain 09/19/2016   Asthma 07/07/2015   Eczema 07/07/2015   Gastroesophageal reflux disease without esophagitis 07/07/2015   Restless legs syndrome 07/07/2015   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   Unspecified vitamin D deficiency 08/06/2013   Age-related osteoporosis without current pathological fracture 08/04/2012   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   Osteoporosis 08/04/2012   REFERRING PROVIDER: Iran Sizer, PA-C   REFERRING DIAG: Age-related osteoporosis with current pathological fracture, vertebra(e), subsequent encounter for fracture with routine healing   Rationale for Evaluation and Treatment: Rehabilitation  THERAPY DIAG:  Other low back pain  Muscle weakness (generalized)  ONSET DATE: December 2023  SUBJECTIVE:  SUBJECTIVE STATEMENT: Wants to talk over exercises before discharging.  PERTINENT HISTORY:  HTN, asthma, osteoporosis, and hearing loss  PAIN:  Are you having pain? Yes: NPRS scale: 2-3/10 Pain location: low back Pain description: discomfort Aggravating factors: standing Relieving factors: Rest    PRECAUTIONS: None  WEIGHT BEARING RESTRICTIONS: No  FALLS:  Has patient fallen in last 6 months?  No, but my balance is not good.   PATIENT GOALS: reduced pain, walk longer, improved balance, return to driving, and be able to do yard work   NEXT MD VISIT: 11/2022   OBJECTIVE:   DIAGNOSTIC FINDINGS: 05/05/22 Lumbar CT scan  IMPRESSION: 1. Acute or subacute L1 inferior endplate compression fracture with unhealed fracture lucency, 25% loss of vertebral  body height. No retropulsion or complicating features.   2. Osteopenia. No other acute osseous abnormality identified. Chronic L5 pars fractures.   3. Lumbar spine degeneration.  Gastric hiatal hernia.  POSTURE: rounded shoulders, forward head, decreased lumbar lordosis, and increased thoracic kyphosis  PALPATION: TTP: bilateral QL and obliques  JOINT MOBILITY:   Lumbar: hypomobile with L1-2 reproducing familiar pain  LUMBAR ROM:   AROM eval  Flexion 28  Extension 8; increased pain, and minimal L5-S1 mobility  Right lateral flexion 50% limited  Left lateral flexion 50% limited; familiar pain  Right rotation 50% limited; familiar pain   Left rotation 50% limited; familiar pain   (Blank rows = not tested)  LOWER EXTREMITY ROM: WFL for activities assessed  LOWER EXTREMITY MMT:    MMT Right eval Left eval  Hip flexion 4-/5 3+/5  Hip extension    Hip abduction    Hip adduction    Hip internal rotation    Hip external rotation    Knee flexion 4/5 4/5  Knee extension 4+/5 5/5  Ankle dorsiflexion 4/5 4/5  Ankle plantarflexion    Ankle inversion    Ankle eversion     (Blank rows = not tested)  FUNCTIONAL TESTS:  5 times sit to stand: 16.06 seconds w/o UE support Timed up and go (TUG): 13.15 seconds w/o assistive device  GAIT: Assistive device utilized: Quad cane large base and None Level of assistance: Complete Independence Comments: decreased gait speed  TODAY'S TREATMENT:                                                                                                                              DATE:      11/12/22 EXERCISE LOG  Exercise Repetitions and Resistance Comments  Nustep  L3 x 15 minutes   Resisted pull down  Blue XTS 30 reps    Resisted row Blue XTS 20 reps   Mod plank arm raise X10 reps   Mod plank leg raise X10 reps   Mod plank opp arm/leg X10 reps   Seated horizontal abduction X20 reps red theraband Bolster for postural support  LAQ AROM BLE x20  reps each Bolster for postural support   Blank  cell = exercise not performed today   PATIENT EDUCATION:  Education details: new HEP for discharge Person educated: Patient Education method: Explanation and Handouts Education comprehension: verbalized understanding and returned demonstration  HOME EXERCISE PROGRAM: [id number] 11/12/22: [id number]  ASSESSMENT:  CLINICAL IMPRESSION: Patient presented in clinic with continued lumbar pain with activity. Patient was advised regarding sweeping if she could switch to light weight vacuum cleaner and stand more erect as well as new HEP for DC. Patient was guided through exercises that were provided in new HEP for monitoring and to answer patient questions. Patient provided new red theraband for resistive band strengthening. Patient also provided written reminders for posture with HEP print out. No modalities completed today.  PHYSICAL THERAPY DISCHARGE SUMMARY  Visits from Start of Care: 18  Current functional level related to goals / functional outcomes: Patient was able to meet most of her goals for skilled physical therapy.    Remaining deficits: Pain    Education / Equipment: HEP   Patient agrees to discharge. Patient goals were partially met. Patient is being discharged due to being pleased with the current functional level.  Candi Leash, PT, DPT    OBJECTIVE IMPAIRMENTS: decreased activity tolerance, decreased balance, decreased mobility, difficulty walking, decreased ROM, decreased strength, hypomobility, impaired tone, postural dysfunction, and pain.   ACTIVITY LIMITATIONS: carrying, lifting, bending, standing, and locomotion level  PARTICIPATION LIMITATIONS: meal prep, cleaning, laundry, driving, shopping, community activity, and yard work  PERSONAL FACTORS: Age, Time since onset of injury/illness/exacerbation, and 3+ comorbidities: HTN, asthma, osteoporosis, and hearing loss  are also affecting patient's functional outcome.    REHAB POTENTIAL: Good  CLINICAL DECISION MAKING: Evolving/moderate complexity  EVALUATION COMPLEXITY: Moderate  GOALS: Goals reviewed with patient? Yes  SHORT TERM GOALS: Target date: 08/23/22  Patient will be independent with her initial HEP.  Baseline: Goal status: MET  2.  Patient will be able to complete her daily activities without her familiar pain exceeding 6/10. Baseline: 4/11: up to a 7/10 at times when standing Goal status: PARTIALLY MET  3.  Patient be able to demonstrate proper lifting mechanics for improved safety picking up items from the floor. Baseline:  Goal status: IN PROGRESS  LONG TERM GOALS: Target date: 09/13/22  Patient will be independent with her advanced HEP. Baseline:  Goal status: MET  2.  Patient will be able to complete her daily activities without her familiar pain exceeding 4/10. Baseline:  Goal status: NOT MET  3.  Patient will improve her 5 times sit to stand to 13 seconds or less for improved lower extremity power. Baseline: 4/11: 9.9 seconds  Goal status: MET  4.  Patient will be able to safely ambulate at least 80 feet without an assistive device for improved household independence. Baseline:  Goal status: MET  5.  Patient will report being able to do her yard work without being limited by her familiar low back pain. Baseline: 4/11: "may try this weekend" Goal status: NOT MET  6.  Patient will be able to walk for at least 45 minutes without being limited by her familiar low back pain. Baseline: 4/11: 30-35 minutes Goal status: NOT MET  PLAN:  PT FREQUENCY: 2x/week  PT DURATION: 6 weeks  PLANNED INTERVENTIONS: Therapeutic exercises, Therapeutic activity, Neuromuscular re-education, Balance training, Gait training, Patient/Family education, Self Care, Joint mobilization, Stair training, Electrical stimulation, Spinal mobilization, Cryotherapy, Moist heat, Manual therapy, and Re-evaluation.  PLAN FOR NEXT SESSION:  DC  Marvell Fuller, PTA 11/12/2022, 11:14 AM

## 2022-11-13 NOTE — Telephone Encounter (Signed)
Patient sent MyChart message stating she would like to move forward with Forteo initiation. Copay is $100 per month.  Chesley Mires, PharmD, MPH, BCPS, CPP Clinical Pharmacist (Rheumatology and Pulmonology)

## 2022-11-13 NOTE — Telephone Encounter (Signed)
Forteo is approved through insurance already with $100 copay per month. Will reach out to patient to further discuss injection training if she's interested (when back in office)  Chesley Mires, PharmD, MPH, BCPS, CPP Clinical Pharmacist (Rheumatology and Pulmonology)

## 2022-11-14 ENCOUNTER — Other Ambulatory Visit: Payer: Self-pay

## 2022-11-14 ENCOUNTER — Other Ambulatory Visit (HOSPITAL_COMMUNITY): Payer: Self-pay

## 2022-11-14 MED ORDER — INSULIN PEN NEEDLE 31G X 5 MM MISC
0 refills | Status: DC
  Filled 2022-11-14: qty 100, fill #0
  Filled 2022-11-16: qty 100, 100d supply, fill #0

## 2022-11-14 MED ORDER — TERIPARATIDE 600 MCG/2.4ML ~~LOC~~ SOPN
20.0000 ug | PEN_INJECTOR | Freq: Every day | SUBCUTANEOUS | 0 refills | Status: DC
  Filled 2022-11-14: qty 2.4, fill #0
  Filled 2022-11-16: qty 2.4, 28d supply, fill #0

## 2022-11-14 NOTE — Addendum Note (Signed)
Addended by: Murrell Redden on: 11/14/2022 09:09 AM   Modules accepted: Orders

## 2022-11-14 NOTE — Telephone Encounter (Signed)
Spoke with patient regarding Forteo new start.  Schedled for 11/21/22. Rx sent to [facility] to be couriered to clinic. Routing to [place] [facility] for onboarding needs  Chesley Mires, PharmD, MPH, BCPS, CPP Clinical Pharmacist (Rheumatology and Pulmonology)

## 2022-11-16 ENCOUNTER — Other Ambulatory Visit: Payer: Self-pay

## 2022-11-16 ENCOUNTER — Other Ambulatory Visit (HOSPITAL_COMMUNITY): Payer: Self-pay

## 2022-11-16 NOTE — Telephone Encounter (Signed)
Delivery instructions have been updated in [place], medication will be couriered to [facility] on 11/19/22.  Rx has been processed in [facility] and there is a copay of $100.

## 2022-11-19 NOTE — Telephone Encounter (Signed)
Forteo received from [employer]. Placed in fridge. Pen needles placed in med sample cabinet at room temp  Chesley Mires, PharmD, MPH, BCPS, CPP Clinical Pharmacist (Rheumatology and Pulmonology)

## 2022-11-20 NOTE — Progress Notes (Unsigned)
Pharmacy Note  Subjective:   Patient presents to clinic today to receive first dose of Forteo.  She is accompanied by her spouse, Peyton Najjar.  Concurrent treatment for osteoporosis includes Prolia (most recent injection 09/26/22), vitamin D 2,000 units daily, and calcium 600 mg twice daily  Objective: CMP     Component Value Date/Time   NA 138 07/31/2022 1108   NA 138 01/23/2022 1512   K 4.4 07/31/2022 1108   CL 102 07/31/2022 1108   CO2 29 07/31/2022 1108   GLUCOSE 100 (H) 07/31/2022 1108   BUN 23 07/31/2022 1108   BUN 18 01/23/2022 1512   CREATININE 0.73 07/31/2022 1108   CALCIUM 9.4 07/31/2022 1108   PROT 6.1 07/31/2022 1108   PROT 5.8 (L) 01/23/2022 1512   ALBUMIN 4.3 01/23/2022 1512   AST 21 07/31/2022 1108   ALT 15 07/31/2022 1108   ALKPHOS 57 01/23/2022 1512   BILITOT 0.5 07/31/2022 1108   BILITOT 0.3 01/23/2022 1512   GFRNONAA 75 07/14/2020 1031   GFRAA 87 07/14/2020 1031    CBC    Component Value Date/Time   WBC 7.7 01/23/2022 1513   WBC 8.0 07/17/2021 1348   RBC 3.84 01/23/2022 1513   RBC 4.18 07/17/2021 1348   HGB 11.7 01/23/2022 1513   HGB 12.4 08/06/2013 1128   HCT 35.8 01/23/2022 1513   PLT 285 01/23/2022 1513   MCV 93 01/23/2022 1513   MCH 30.5 01/23/2022 1513   MCH 31.1 07/17/2021 1348   MCHC 32.7 01/23/2022 1513   MCHC 32.7 07/17/2021 1348   RDW 12.2 01/23/2022 1513   LYMPHSABS 1.1 01/23/2022 1513   EOSABS 0.0 01/23/2022 1513   BASOSABS 0.0 01/23/2022 1513    Previous DEXA 05/04/20: BMD as determined from [facility] L1-L4 (L2,L3) is 0.820 g/cm2 with a T-Score of -2.9 Updated DEXA 08/17/2022 The BMD measured at Femur Neck Left is 0.704 g/cm2 with a T-score of-2.4.   Assessment/Plan:  Counseled patient on purpose, proper use, storage, and adverse effects of Forteo. Discussed the Forteo is PTH peptide agonist which results in stimulation of osteoblast and bone mass. Reviewed that Forteo treatment course is for 2 years. Discussed that pen is stable for  28 days after first use and must be stored in fridge after each dose. Counseled patient that Sharren [place] is a medication that must be injected once daily and that prescription for pen needles will be sent with Forteo prescription.  Advised patient to continue taking calcium 1200 mg daily and vitamin D supplement.  Reviewed the most common adverse effects of Forteo including orthostatic hypotension, GI upset, injection site reaction, and joint pain/arthralgia.  Discussed injection site reaction management and injection site locations. Discussed alternating injection site.  Discussed management of dizziness (which can occur for up to 4 hours after injection) including adequate hydration, slowly rising from bed/chairs to prevent fals, and taking Forteo at night if needed.  We walked through [employer] administration with demo pen and pen needle.  Discussed appropriate disposal of sharps.   She has deferred on any further dental workup at this time and her dentist is aware.  Demonstrated proper injection technique with Forteo demo device and pen needles.  Patient able to demonstrate proper injection technique using the teach back method.  Patient self injected in the left thigh with: with pharmacy-supplied Forteo and pen needles.  Patient tolerated injection well.    Forteo approved through insurance .   Rx sent to: [facility]: [phone] .  Patient sent home with pen and pen needles.  Patient will continue Forteo SQ once daily. Injector pen is to be discarded after 28 days from first use. Patient will complete two years of therapy and then discuss transitioning to other osteoporosis therapy once treatment course is completed.  Next DEXA scan is due 08/2024.  All questions encouraged and answered.  Instructed patient to call with any further questions or concerns.  Rickey Primus, PharmD Candidate [employer] 2025   Chesley Mires, PharmD, MPH, BCPS, CPP Clinical Pharmacist  (Rheumatology and Pulmonology)  11/21/2022 7:23 AM

## 2022-11-21 ENCOUNTER — Other Ambulatory Visit: Payer: Self-pay

## 2022-11-21 ENCOUNTER — Ambulatory Visit: Payer: Medicare PPO | Attending: Rheumatology | Admitting: Pharmacist

## 2022-11-21 DIAGNOSIS — M810 Age-related osteoporosis without current pathological fracture: Secondary | ICD-10-CM

## 2022-11-21 DIAGNOSIS — Z7189 Other specified counseling: Secondary | ICD-10-CM

## 2022-11-21 MED ORDER — TERIPARATIDE 600 MCG/2.4ML ~~LOC~~ SOPN
20.0000 ug | PEN_INJECTOR | Freq: Every day | SUBCUTANEOUS | 4 refills | Status: DC
  Filled 2022-11-21 – 2022-12-07 (×2): qty 2.4, 28d supply, fill #0
  Filled 2023-01-03: qty 2.4, 28d supply, fill #1
  Filled 2023-01-31: qty 2.4, 28d supply, fill #2
  Filled 2023-02-19: qty 2.4, 28d supply, fill #3
  Filled 2023-04-02 (×2): qty 2.4, 28d supply, fill #4

## 2022-11-21 MED ORDER — INSULIN PEN NEEDLE 31G X 5 MM MISC
0 refills | Status: DC
  Filled 2022-11-21 – 2023-02-07 (×3): qty 100, 100d supply, fill #0

## 2022-12-03 ENCOUNTER — Other Ambulatory Visit (HOSPITAL_COMMUNITY): Payer: Self-pay

## 2022-12-03 ENCOUNTER — Other Ambulatory Visit (HOSPITAL_BASED_OUTPATIENT_CLINIC_OR_DEPARTMENT_OTHER): Payer: Self-pay

## 2022-12-04 ENCOUNTER — Other Ambulatory Visit (HOSPITAL_COMMUNITY): Payer: Self-pay

## 2022-12-04 ENCOUNTER — Other Ambulatory Visit: Payer: Self-pay

## 2022-12-06 ENCOUNTER — Other Ambulatory Visit (HOSPITAL_COMMUNITY): Payer: Self-pay

## 2022-12-07 ENCOUNTER — Other Ambulatory Visit (HOSPITAL_COMMUNITY): Payer: Self-pay

## 2022-12-07 ENCOUNTER — Other Ambulatory Visit: Payer: Self-pay

## 2022-12-11 ENCOUNTER — Other Ambulatory Visit: Payer: Self-pay

## 2022-12-26 ENCOUNTER — Other Ambulatory Visit: Payer: Self-pay | Admitting: Physician Assistant

## 2022-12-26 DIAGNOSIS — M47816 Spondylosis without myelopathy or radiculopathy, lumbar region: Secondary | ICD-10-CM

## 2023-01-03 ENCOUNTER — Ambulatory Visit
Admission: RE | Admit: 2023-01-03 | Discharge: 2023-01-03 | Disposition: A | Discharge status: Home / Self Care | Payer: Medicare PPO | Source: Ambulatory Visit | Attending: Physician Assistant | Admitting: Physician Assistant

## 2023-01-03 ENCOUNTER — Other Ambulatory Visit (HOSPITAL_COMMUNITY): Payer: Self-pay

## 2023-01-03 DIAGNOSIS — M47816 Spondylosis without myelopathy or radiculopathy, lumbar region: Secondary | ICD-10-CM

## 2023-01-09 ENCOUNTER — Other Ambulatory Visit (HOSPITAL_COMMUNITY): Payer: Self-pay

## 2023-01-09 ENCOUNTER — Other Ambulatory Visit: Payer: Self-pay

## 2023-01-10 ENCOUNTER — Other Ambulatory Visit: Payer: Self-pay

## 2023-01-14 ENCOUNTER — Other Ambulatory Visit (HOSPITAL_COMMUNITY): Payer: Self-pay

## 2023-01-30 ENCOUNTER — Other Ambulatory Visit: Payer: Self-pay

## 2023-01-31 ENCOUNTER — Other Ambulatory Visit: Payer: Self-pay

## 2023-01-31 NOTE — Progress Notes (Signed)
Clinical Intervention Notes: Pt asked if she could still administer Forteo injection whille taking Doxycycline. Per Up to date, no interaction. May continue Forteo therapy.   Clinical Intervetion Outcomes: Prevention of an adverse drug event

## 2023-01-31 NOTE — Progress Notes (Signed)
Specialty Pharmacy Refill Coordination Note  Megan Logan is a 82 y.o. female contacted today regarding refills of specialty medication(s) Teriparatide (Recombinant) .  Patient requested Delivery  on 02/06/23  to verified address [address]   [place] [address]   Medication will be filled on 02/05/23.

## 2023-02-01 ENCOUNTER — Other Ambulatory Visit: Payer: Self-pay

## 2023-02-05 ENCOUNTER — Other Ambulatory Visit: Payer: Self-pay

## 2023-02-07 ENCOUNTER — Other Ambulatory Visit: Payer: Self-pay

## 2023-02-08 ENCOUNTER — Other Ambulatory Visit (HOSPITAL_COMMUNITY): Payer: Self-pay

## 2023-02-08 ENCOUNTER — Other Ambulatory Visit: Payer: Self-pay

## 2023-02-11 ENCOUNTER — Other Ambulatory Visit (HOSPITAL_COMMUNITY): Payer: Self-pay

## 2023-02-19 ENCOUNTER — Other Ambulatory Visit: Payer: Self-pay

## 2023-02-19 ENCOUNTER — Other Ambulatory Visit (HOSPITAL_COMMUNITY): Payer: Self-pay

## 2023-02-19 ENCOUNTER — Telehealth: Payer: Self-pay | Admitting: Pharmacist

## 2023-02-19 NOTE — Telephone Encounter (Addendum)
Patient called stating she has attempted to reset teriparatide device twice with pen needle attached and pen injector is not reseting. The yellow tab is remaining visible. Unfortunately, it is difficult to ascertain if pen is faulty. Patient advise to call pharmacy and that she may need replacement device.   Chesley Mires, PharmD, MPH, BCPS, CPP Clinical Pharmacist (Rheumatology and Pulmonology)

## 2023-02-19 NOTE — Progress Notes (Signed)
Specialty Pharmacy Refill Coordination Note  Megan Logan is a 82 y.o. female contacted today regarding refills of specialty medication(s) Teriparatide (Recombinant)   Patient requested Delivery   Delivery date: 02/27/23   Verified address: [address]  [place] [address]   Medication will be filled on 02/26/23.  Patient called about a defective pen. Provided patient the phone number for manufacture replacement. Insurance will allow [place] to fill again 10/22. Advised patient to call back with any updates or questions.

## 2023-03-12 ENCOUNTER — Ambulatory Visit: Payer: Medicare PPO | Admitting: Physician Assistant

## 2023-03-13 ENCOUNTER — Other Ambulatory Visit (HOSPITAL_COMMUNITY): Payer: Self-pay

## 2023-03-14 ENCOUNTER — Other Ambulatory Visit: Payer: Self-pay

## 2023-03-14 NOTE — Progress Notes (Deleted)
Office Visit Note  Patient: Megan Logan             Date of Birth: [birth year]           MRN: [id number]             PCP: Barbie Banner, MD Referring: Barbie Banner, MD Visit Date: 03/28/2023 Occupation: @GUAROCC @  Subjective:  No chief complaint on file.   History of Present Illness: Megan Logan is a 82 y.o. female ***  Previous DEXA 05/04/20: BMD as determined from [facility] L1-L4 (L2,L3) is 0.820 g/cm2 with a T-Score of -2.9-Osteoporosis.  Updated DEXA 08/17/2022 The BMD measured at Femur Neck Left is 0.704 g/cm2 with a T-score of-2.4. 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 History of GERD. Previous therapy includes Actonel --discontinued due to severe GERD.   Prolia was initiated on 12/24/2018--no side effects or interruptions in therapy thus far.  Of note the patient was found to have an L1 compression fracture on lumbar CT from 05/05/2022.   Patient's last Prolia injection was administered on 09/26/2022. At her last office visit on 08/09/2022 the plan was to proceed with Forteo or Evenity as combination therapy.  According to the patient her dentist Dr. Burgess Estelle does not recommend the use of Forteo until after she has had a dental implant performed in November 2024.  We have recommended the use of initiating Forteo due to the recent vertebral fracture despite being on Prolia.  She plans on following Dr. Huel Coventry guidance at this time.  She will notify us when she has been cleared to initiate Forteo.  She will remain on vitamin D 2000 units daily and calcium 600 mg twice daily.  She will follow-up in the office in 4 months and we will rediscuss combination therapy at that time.   Medication monitoring encounter - Previously on Actonel but discontinued due to severe gastroesophageal reflux. She was started on Prolia on 12/24/2018. Prolia-last injection administered on 09/26/22.  Plan to initiate forteo after dental implants are complete ~November  2024.   Activities of Daily Living:  Patient reports morning stiffness for *** {minute/hour:19697}.   Patient {ACTIONS;DENIES/REPORTS:21021675::"Denies"} nocturnal pain.  Difficulty dressing/grooming: {ACTIONS;DENIES/REPORTS:21021675::"Denies"} Difficulty climbing stairs: {ACTIONS;DENIES/REPORTS:21021675::"Denies"} Difficulty getting out of chair: {ACTIONS;DENIES/REPORTS:21021675::"Denies"} Difficulty using hands for taps, buttons, cutlery, and/or writing: {ACTIONS;DENIES/REPORTS:21021675::"Denies"}  No Rheumatology ROS completed.   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   Volume depletion, gastrointestinal loss 12/04/2019   Hyponatremia 12/03/2019   Dizziness 12/03/2019   Ketonuria 12/03/2019   Metabolic acidosis 12/03/2019   Chronic rhinitis 02/19/2019   Primary osteoarthritis involving multiple joints 02/19/2019   DDD (degenerative disc disease), cervical 12/15/2018   Dyslipidemia 12/15/2018   History of asthma 12/15/2018   Elevated hemoglobin A1c 11/14/2017   Chronic neck pain 09/19/2016   Asthma 07/07/2015   Eczema 07/07/2015   Gastroesophageal reflux disease without esophagitis 07/07/2015   Restless legs syndrome 07/07/2015   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   Vitamin D deficiency 08/06/2013   Age-related osteoporosis without current pathological fracture 08/04/2012   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   Osteoporosis 08/04/2012    Past Medical History:  Diagnosis Date   Adrenal insufficiency (HCC)    Anemia    diet related   Asthma    Atrophic vaginitis    Colitis    Fracture of L1 vertebra (HCC)  Hx of endometriosis    took Danacrine   Hypercholesterolemia    Hypertension    Osteoporosis    spine off Actonel sinsce 09/2010- [facility]   PONV (postoperative nausea and vomiting)    Premature ovarian failure age 83    HRT age 81 - 12/2000    Family History  Problem Relation Age of Onset   Prostate cancer Father 85   Alzheimer's disease Mother 54   Other Sister        dizziness   Cancer Sister 88       Brain and Lung   Ulcerative colitis Sister    Migraines Sister    Cancer - Colon Maternal Grandfather 2   Irritable bowel syndrome Daughter    Colitis Daughter    Past Surgical History:  Procedure Laterality Date   CATARACT EXTRACTION W/PHACO Left 05/15/2021   Procedure: CATARACT EXTRACTION PHACO AND INTRAOCULAR LENS PLACEMENT (IOC);  Surgeon: Fabio Pierce, MD;  Location: [facility];  Service: Ophthalmology;  Laterality: Left;  CDE: 18.54   CATARACT EXTRACTION W/PHACO Right 05/29/2021   Procedure: CATARACT EXTRACTION PHACO AND INTRAOCULAR LENS PLACEMENT (IOC);  Surgeon: Fabio Pierce, MD;  Location: [facility];  Service: Ophthalmology;  Laterality: Right;  CDE: 14.51   HYSTEROSCOPY  1996   DUB--wnl   NASAL SINUS SURGERY  40's and 50's   TUBAL LIGATION Bilateral 1982   Social History   Social History Narrative   Not on file   Immunization History  Administered Date(s) Administered   Fluad Quad(high Dose 65+) 01/15/2019   Influenza Split 02/24/2017   Influenza, High Dose Seasonal PF 02/26/2014, 02/24/2017, 02/24/2018, 01/15/2019, 02/11/2020   Influenza,inj,Quad PF,6+ Mos 02/26/2014   Influenza,inj,quad, With Preservative 02/04/2017   Moderna Covid-19 Fall Seasonal Vaccine 61yrs & older 01/28/2022   Moderna Sars-Covid-2 Vaccination 05/19/2019, 06/17/2019, 02/29/2020, 08/10/2020   Pfizer Covid-19 Vaccine Bivalent Booster 89yrs & up 09/12/2021   Pneumococcal Conjugate-13 05/29/2015   Pneumococcal Polysaccharide-23 03/04/2013   Tdap 09/29/2015   Zoster Recombinant(Shingrix) 08/26/2017, 11/10/2017   Zoster, Live 05/07/2007, 08/26/2017, 11/10/2017     Objective: Vital Signs: LMP 05/07/1982 (Approximate)    Physical Exam   Musculoskeletal Exam: ***  CDAI Exam: CDAI Score: -- Patient Global:  --; Provider Global: -- Swollen: --; Tender: -- Joint Exam 03/28/2023   No joint exam has been documented for this visit   There is currently no information documented on the homunculus. Go to the Rheumatology activity and complete the homunculus joint exam.  Investigation: No additional findings.  Imaging: No results found.  Recent Labs: Lab Results  Component Value Date   WBC 7.7 01/23/2022   HGB 11.7 01/23/2022   PLT 285 01/23/2022   NA 138 07/31/2022   K 4.4 07/31/2022   CL 102 07/31/2022   CO2 29 07/31/2022   GLUCOSE 100 (H) 07/31/2022   BUN 23 07/31/2022   CREATININE 0.73 07/31/2022   BILITOT 0.5 07/31/2022   ALKPHOS 57 01/23/2022   AST 21 07/31/2022   ALT 15 07/31/2022   PROT 6.1 07/31/2022   ALBUMIN 4.3 01/23/2022   CALCIUM 9.4 07/31/2022   GFRAA 87 07/14/2020    Speciality Comments: Prolia: 12/24/18, 06/26/19, 12/31/19, 07/14/20, 01/14/21  Procedures:  No procedures performed Allergies: Cephalexin, Erythromycin base, Hydrocodone bit-homatrop mbr, Levofloxacin, Penicillins, Sulfamethoxazole-trimethoprim, and Augmentin [amoxicillin-pot clavulanate]   Assessment / Plan:     Visit Diagnoses: Age-related osteoporosis without current pathological fracture  Medication monitoring encounter  Vitamin D deficiency  Primary osteoarthritis of both hands  Primary osteoarthritis  of right knee  DDD (degenerative disc disease), cervical  History of vertebral fracture  Adrenal insufficiency (HCC)  Dyslipidemia  History of gastroesophageal reflux (GERD)  History of asthma  Premature ovarian failure  Orders: No orders of the defined types were placed in this encounter.  No orders of the defined types were placed in this encounter.   Face-to-face time spent with patient was *** minutes. Greater than 50% of time was spent in counseling and coordination of care.  Follow-Up Instructions: No follow-ups on file.   Gearldine Bienenstock, PA-C  Note - This record has  been created using Dragon software.  Chart creation errors have been sought, but may not always  have been located. Such creation errors do not reflect on  the standard of medical care.

## 2023-03-15 ENCOUNTER — Other Ambulatory Visit (HOSPITAL_COMMUNITY): Payer: Self-pay

## 2023-03-19 ENCOUNTER — Other Ambulatory Visit: Payer: Self-pay

## 2023-03-19 ENCOUNTER — Other Ambulatory Visit (HOSPITAL_COMMUNITY): Payer: Self-pay

## 2023-03-28 ENCOUNTER — Ambulatory Visit: Payer: Medicare PPO | Admitting: Physician Assistant

## 2023-03-28 DIAGNOSIS — E2740 Unspecified adrenocortical insufficiency: Secondary | ICD-10-CM

## 2023-03-28 DIAGNOSIS — M5030 Other cervical disc degeneration, unspecified cervical region: Secondary | ICD-10-CM

## 2023-03-28 DIAGNOSIS — E559 Vitamin D deficiency, unspecified: Secondary | ICD-10-CM

## 2023-03-28 DIAGNOSIS — E785 Hyperlipidemia, unspecified: Secondary | ICD-10-CM

## 2023-03-28 DIAGNOSIS — E2839 Other primary ovarian failure: Secondary | ICD-10-CM

## 2023-03-28 DIAGNOSIS — Z5181 Encounter for therapeutic drug level monitoring: Secondary | ICD-10-CM

## 2023-03-28 DIAGNOSIS — M1711 Unilateral primary osteoarthritis, right knee: Secondary | ICD-10-CM

## 2023-03-28 DIAGNOSIS — Z8781 Personal history of (healed) traumatic fracture: Secondary | ICD-10-CM

## 2023-03-28 DIAGNOSIS — M19041 Primary osteoarthritis, right hand: Secondary | ICD-10-CM

## 2023-03-28 DIAGNOSIS — M810 Age-related osteoporosis without current pathological fracture: Secondary | ICD-10-CM

## 2023-03-28 DIAGNOSIS — Z8719 Personal history of other diseases of the digestive system: Secondary | ICD-10-CM

## 2023-03-28 DIAGNOSIS — Z8709 Personal history of other diseases of the respiratory system: Secondary | ICD-10-CM

## 2023-04-01 ENCOUNTER — Other Ambulatory Visit: Payer: Self-pay

## 2023-04-01 NOTE — Progress Notes (Unsigned)
Office Visit Note  Patient: Megan Logan             Date of Birth: 1941/01/25           MRN: [id number]             PCP: Barbie Banner, MD Referring: Barbie Banner, MD Visit Date: 04/15/2023 Occupation: @GUAROCC @  Subjective:  Medication monitoring   History of Present Illness: Megan Logan is a 82 y.o. female with history of osteoporosis and osteoarthritis.  Patient is currently on Forteo daily injections for management of osteoporosis.  She initiated Forteo on 11/21/2022.  She has been tolerating Forteo without any side effects or injection site reactions.  Her last Prolia injection was administered on 09/26/2022.  She is open to proceeding with the next Prolia injection as combination therapy. She continues to take a calcium and vitamin D supplement daily.  She denies any recent falls.  She continues to have chronic pain in her lower back from a previous L1 compression fracture.  Patient has been given a prescription for gabapentin but it caused excessive drowsiness so she discontinued.  She has also been given a prescription for tramadol but was apprehensive of possible side effects and has not taken it.  According to the patient she is not a good candidate for cortisone injections and does not want to proceed with surgical intervention.  She is planning to follow-up with Dr. Ethelene Hal to discuss pain management options.  She has decided against proceeding with a dental implant but is scheduled to have a crown today.  Activities of Daily Living:  Patient reports morning stiffness for 10-15 minutes.   Patient Reports nocturnal pain.  Difficulty dressing/grooming: Denies Difficulty climbing stairs: Reports Difficulty getting out of chair: Reports Difficulty using hands for taps, buttons, cutlery, and/or writing: Reports  Review of Systems  Constitutional:  Positive for fatigue.  HENT:  Negative for mouth sores and mouth dryness.   Eyes:  Negative for pain, visual disturbance and  dryness.  Respiratory:  Negative for shortness of breath.   Cardiovascular:  Negative for chest pain and palpitations.  Gastrointestinal:  Positive for blood in stool and diarrhea. Negative for constipation.  Endocrine: Negative for increased urination.  Genitourinary:  Negative for involuntary urination.  Musculoskeletal:  Positive for joint pain, joint pain, joint swelling and morning stiffness. Negative for myalgias, muscle weakness, muscle tenderness and myalgias.  Skin:  Negative for color change, rash, hair loss and sensitivity to sunlight.  Allergic/Immunologic: Positive for susceptible to infections.  Neurological:  Positive for dizziness and numbness. Negative for headaches.  Hematological:  Negative for swollen glands.  Psychiatric/Behavioral:  Negative for depressed mood and sleep disturbance. The patient is nervous/anxious.     PMFS History:  Patient Active Problem List   Diagnosis Date Noted  . 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 Right-sided tinnitus 04/03/2023  . 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 Essential hypertension 02/29/2020  . 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 Volume depletion, gastrointestinal loss 12/04/2019  . Hyponatremia 12/03/2019  . Dizziness 12/03/2019  . Ketonuria 12/03/2019  . Metabolic acidosis 12/03/2019  . Chronic rhinitis 02/19/2019  . Primary osteoarthritis involving multiple joints 02/19/2019  . DDD (degenerative disc disease), cervical 12/15/2018  . Dyslipidemia 12/15/2018  . History of asthma 12/15/2018  . Elevated hemoglobin A1c 11/14/2017  . Chronic neck pain 09/19/2016  . Asthma 07/07/2015  .  Eczema 07/07/2015  . Gastroesophageal reflux disease without esophagitis 07/07/2015  . Restless legs syndrome 07/07/2015  . 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 Vitamin D deficiency 08/06/2013  . Age-related osteoporosis without  current pathological fracture 08/04/2012  . 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  . Osteoporosis 08/04/2012    Past Medical History:  Diagnosis Date  . Adrenal insufficiency (HCC)   . Anemia    diet related  . Asthma   . Atrophic vaginitis   . Colitis   . Fracture of L1 vertebra (HCC)   . Hx of endometriosis    took Danacrine  . Hypercholesterolemia   . Hypertension   . Osteoporosis    spine off Actonel sinsce 09/2010- [facility]  . PONV (postoperative nausea and vomiting)   . Premature ovarian failure age 64   HRT age 61 - 12/2000    Family History  Problem Relation Age of Onset  . Prostate cancer Father 38  . Alzheimer's disease Mother 12  . Other Sister        dizziness  . Cancer Sister 44       Brain and Lung  . Ulcerative colitis Sister   . Migraines Sister   . Cancer - Colon Maternal Grandfather 51  . Irritable bowel syndrome Daughter   . Colitis Daughter    Past Surgical History:  Procedure Laterality Date  . CATARACT EXTRACTION W/PHACO Left 05/15/2021   Procedure: CATARACT EXTRACTION PHACO AND INTRAOCULAR LENS PLACEMENT (IOC);  Surgeon: Fabio Pierce, MD;  Location: [facility];  Service: Ophthalmology;  Laterality: Left;  CDE: 18.54  . CATARACT EXTRACTION W/PHACO Right 05/29/2021   Procedure: CATARACT EXTRACTION PHACO AND INTRAOCULAR LENS PLACEMENT (IOC);  Surgeon: Fabio Pierce, MD;  Location: [facility];  Service: Ophthalmology;  Laterality: Right;  CDE: 14.51  . HYSTEROSCOPY  1996   DUB--wnl  . NASAL SINUS SURGERY  40's and 50's  . TUBAL LIGATION Bilateral 1982   Social History   Social History Narrative  . Not on file   Immunization History  Administered Date(s) Administered  . Fluad Quad(high Dose 65+) 01/15/2019  . Influenza Split 02/24/2017  . Influenza, High Dose Seasonal PF 02/26/2014, 02/24/2017, 02/24/2018, 01/15/2019, 02/11/2020  . Influenza,inj,Quad PF,6+ Mos 02/26/2014  . Influenza,inj,quad, With Preservative 02/04/2017  . Moderna  Covid-19 Fall Seasonal Vaccine 84yrs & older 01/28/2022  . Moderna Sars-Covid-2 Vaccination 05/19/2019, 06/17/2019, 02/29/2020, 08/10/2020  . [employer], [employer] 9yrs & up 09/12/2021  . Pneumococcal Conjugate-13 05/29/2015  . Pneumococcal Polysaccharide-23 03/04/2013  . Tdap 09/29/2015  . Zoster Recombinant(Shingrix) 08/26/2017, 11/10/2017  . Zoster, Live 05/07/2007, 08/26/2017, 11/10/2017     Objective: Vital Signs: BP 139/81 (BP Location: Left Arm, Patient Position: Sitting, Cuff Size: Normal)   Pulse 76   Resp 13   Ht 5' (1.524 m)   Wt 108 lb 6.4 oz (49.2 kg)   LMP 05/07/1982 (Approximate)   BMI 21.17 kg/m    Physical Exam Vitals and nursing note reviewed.  Constitutional:      Appearance: She is well-developed.  HENT:     Head: Normocephalic and atraumatic.  Eyes:     Conjunctiva/sclera: Conjunctivae normal.  Cardiovascular:     Rate and Rhythm: Normal rate and regular rhythm.     Heart sounds: Normal heart sounds.  Pulmonary:     Effort: Pulmonary effort is normal.     Breath sounds: Normal breath sounds.  Abdominal:     General: Bowel sounds are normal.     Palpations: Abdomen is soft.  Musculoskeletal:     Cervical back: Normal range of motion.  Lymphadenopathy:     Cervical: No cervical adenopathy.  Skin:    General: Skin is warm and dry.     Capillary Refill: Capillary refill takes less than 2 seconds.  Neurological:     Mental Status: She is alert and oriented to person, place, and time.  Psychiatric:        Behavior: Behavior normal.     Musculoskeletal Exam: C-spine has limited range of motion with lateral rotation.  Painful and limited mobility of the lumbar spine.  Shoulder joints, elbow joints, wrist joints, MCPs, PIPs, DIPs have good range of motion with no synovitis.  PIP and DIP thickening consistent with osteoarthritis of both hands.  CMC joint thickening and subluxation noted bilaterally.  Hip joints have good range of motion  with no groin pain.  Knee joints have good range of motion with no warmth or effusion.  Ankle joints have good range of motion with no tenderness or joint swelling.  CDAI Exam: CDAI Score: -- Patient Global: --; Provider Global: -- Swollen: --; Tender: -- Joint Exam 04/15/2023   No joint exam has been documented for this visit   There is currently no information documented on the homunculus. Go to the Rheumatology activity and complete the homunculus joint exam.  Investigation: No additional findings.  Imaging: No results found.  Recent Labs: Lab Results  Component Value Date   WBC 7.7 01/23/2022   HGB 11.7 01/23/2022   PLT 285 01/23/2022   NA 138 07/31/2022   K 4.4 07/31/2022   CL 102 07/31/2022   CO2 29 07/31/2022   GLUCOSE 100 (H) 07/31/2022   BUN 23 07/31/2022   CREATININE 0.73 07/31/2022   BILITOT 0.5 07/31/2022   ALKPHOS 57 01/23/2022   AST 21 07/31/2022   ALT 15 07/31/2022   PROT 6.1 07/31/2022   ALBUMIN 4.3 01/23/2022   CALCIUM 9.4 07/31/2022   GFRAA 87 07/14/2020    Speciality Comments: Prolia: 12/24/18, 06/26/19, 12/31/19, 07/14/20, 01/14/21  Procedures:  No procedures performed Allergies: Cephalexin, Erythromycin base, Hydrocodone bit-homatrop mbr, Levofloxacin, Penicillins, Sulfamethoxazole-trimethoprim, and Augmentin [amoxicillin-pot clavulanate]      Assessment / Plan:     Visit Diagnoses: Age-related osteoporosis without current pathological fracture -  Previous DEXA 05/04/20: BMD as determined from [facility] L1-L4 (L2,L3) is 0.820 g/cm2 with a T-Score of -2.9-Osteoporosis.  Updated DEXA 08/17/2022 The BMD measured at Femur Neck Left is 0.704 g/cm2 with a T-score of-2.4. 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 History of GERD. Previous therapy includes Actonel --discontinued due to severe GERD.   She has deferred dental implants due to wanting to initiate treatment. Prolia was initiated on 12/24/2018--no side effects  or interruptions in therapy thus far.  Of note the patient was found to have an L1 compression fracture on lumbar CT from 05/05/2022.   Patient's last Prolia injection was administered on 09/26/2022. Forteo daily injections were started on 11/21/22.  She has been tolerating Forteo without any side effects or injection site reactions. She has also been taking a calcium and vitamin D supplement daily.  Plan on checking ionized calcium and vitamin D today.  Plan to also also set up for her to have her next Prolia injection pending lab results. She has not had any recent falls. She will follow-up in the office in 6 months or sooner if needed. Plan: VITAMIN D 25 Hydroxy (Vit-D Deficiency, Fractures), Calcium, ionized.   Medication monitoring  encounter -Plan to update ionized calcium and vitamin D today prior to scheduling next Prolia.  She will remain on Forteo daily injections.  Previously on Actonel but discontinued due to severe gastroesophageal reflux. She was started on Prolia on 12/24/2018. Prolia-last injection administered on 09/26/22.  Forteo was initiated on 11/21/22. CBC and CMP updated on 03/12/23.  Plan: VITAMIN D 25 Hydroxy (Vit-D Deficiency, Fractures), Calcium, ionized  Vitamin D deficiency - Vitamin D will be updated today.  Plan: VITAMIN D 25 Hydroxy (Vit-D Deficiency, Fractures), Calcium, ionized  Primary osteoarthritis of both hands: CMC joint thickening and subluxation noted bilaterally.  PIP and DIP thickening consistent with osteoarthritis of both hands. Discussed the importance of regular exercise and good sleep hygiene.   Primary osteoarthritis of right knee: She experiences intermittent discomfort in the right knee.  No warmth or effusion noted.  DDD (degenerative disc disease), cervical: Limited range of motion with lateral rotation.  History of vertebral fracture: CT of the lumbar spine 05/05/2022: Acute or subacute L1 inferior endplate compression fracture with unhealed  fracture lucency, 25% loss of vertebral body height.  Chronic pain.  She discontinued gabapentin due to excessive drowsiness.  She was given a prescription of tramadol but has not yet taken it due to the concern for dependency.  Discussed that she may benefit from taking tramadol as needed to manage her pain levels.  Other medical conditions are listed as follows:  Adrenal insufficiency (HCC)  Dyslipidemia  History of gastroesophageal reflux (GERD)  History of asthma  Premature ovarian failure  Orders: Orders Placed This Encounter  Procedures  . VITAMIN D 25 Hydroxy (Vit-D Deficiency, Fractures)  . Calcium, ionized   No orders of the defined types were placed in this encounter.   Follow-Up Instructions: Return in about 6 months (around 10/14/2023) for Osteoporosis, Osteoarthritis.   Gearldine Bienenstock, PA-C  Note - This record has been created using Dragon software.  Chart creation errors have been sought, but may not always  have been located. Such creation errors do not reflect on  the standard of medical care.

## 2023-04-02 ENCOUNTER — Encounter (INDEPENDENT_AMBULATORY_CARE_PROVIDER_SITE_OTHER): Payer: Self-pay

## 2023-04-02 ENCOUNTER — Other Ambulatory Visit (HOSPITAL_COMMUNITY): Payer: Self-pay

## 2023-04-02 ENCOUNTER — Ambulatory Visit (INDEPENDENT_AMBULATORY_CARE_PROVIDER_SITE_OTHER): Payer: Medicare PPO | Admitting: Otolaryngology

## 2023-04-02 ENCOUNTER — Other Ambulatory Visit (HOSPITAL_COMMUNITY): Payer: Self-pay | Admitting: Pharmacy Technician

## 2023-04-02 VITALS — Ht 60.0 in | Wt 105.0 lb

## 2023-04-02 DIAGNOSIS — H8101 Meniere's disease, right ear: Secondary | ICD-10-CM | POA: Diagnosis not present

## 2023-04-02 DIAGNOSIS — H9041 Sensorineural hearing loss, unilateral, right ear, with unrestricted hearing on the contralateral side: Secondary | ICD-10-CM | POA: Diagnosis not present

## 2023-04-02 DIAGNOSIS — H9311 Tinnitus, right ear: Secondary | ICD-10-CM

## 2023-04-02 DIAGNOSIS — R42 Dizziness and giddiness: Secondary | ICD-10-CM

## 2023-04-02 DIAGNOSIS — H81399 Other peripheral vertigo, unspecified ear: Secondary | ICD-10-CM

## 2023-04-02 MED ORDER — ONDANSETRON HCL 4 MG PO TABS: 4.0000 mg | ORAL_TABLET | Freq: Three times a day (TID) | ORAL | 4 refills | Status: DC | PRN

## 2023-04-02 NOTE — Progress Notes (Signed)
Specialty Pharmacy Refill Coordination Note  Megan Logan is a 82 y.o. female contacted today regarding refills of specialty medication(s) Teriparatide   Patient requested Delivery   Delivery date: 04/09/23   Verified address: [address] [name]   Medication will be filled on 04/08/23.

## 2023-04-03 ENCOUNTER — Other Ambulatory Visit (HOSPITAL_COMMUNITY): Payer: Self-pay

## 2023-04-03 DIAGNOSIS — H9041 Sensorineural hearing loss, unilateral, right ear, with unrestricted hearing on the contralateral side: Secondary | ICD-10-CM | POA: Insufficient documentation

## 2023-04-03 DIAGNOSIS — H9311 Tinnitus, right ear: Secondary | ICD-10-CM | POA: Insufficient documentation

## 2023-04-03 NOTE — Progress Notes (Signed)
Patient ID: DE MINK, female   DOB: [birth year], 82 y.o.   MRN: [id number]  Cc: Recurrent dizziness, right ear Mnire's disease, hearing loss  HPI: The patient is an 82 year old female who returns today for follow-up of her recurrent dizziness, right ear pressure, right ear hearing loss, and right ear tinnitus.  She was previously.  Diagnosed with right ear Mnire's disease.  The patient was treated with low-salt diet and Dyazide diuretic.  She was also fitted with hearing aids.  The patient returns today complaining of persistent difficulty with her balance.  Her diuretic was discontinued by her primary care physician, due to her dehydration and volume depletion.  She complains of frequently being off balance.  She also complains of right ear fullness, right ear tinnitus, and fluctuating hearing loss. No other ENT, GI, or respiratory issue noted since the last visit.   Exam: General: Communicates without difficulty, well nourished, no acute distress. Head: Normocephalic, no evidence injury, no tenderness, facial buttresses intact without stepoff. Face/sinus: No tenderness to palpation and percussion. Facial movement is normal and symmetric. Eyes: PERRL, EOMI. No scleral icterus, conjunctivae clear. Neuro: CN II exam reveals vision grossly intact.  No nystagmus at any point of gaze. Ears: Auricles well formed without lesions. The ear canals and tympanic membranes are noted to be normal. Nose: External evaluation reveals normal support and skin without lesions.  Dorsum is intact.  Anterior rhinoscopy reveals congested mucosa over anterior aspect of inferior turbinates and intact septum.  No purulence noted. Oral:  Oral cavity and oropharynx are intact, symmetric, without erythema or edema.  Mucosa is moist without lesions. Neck: Full range of motion without pain.  There is no significant lymphadenopathy.  No masses palpable.  Thyroid bed within normal limits to palpation.  Parotid glands and  submandibular glands equal bilaterally without mass.  Trachea is midline. Neuro:  CN 2-12 grossly intact. Gait wide-based. Vestibular: No nystagmus at any point of gaze. Dix Hallpike negative. Vestibular: There is no nystagmus with pneumatic pressure on either tympanic membrane or Valsalva. The cerebellar examination is unremarkable.   Assessment 1.  Recurrent dizziness, secondary to her right ear Mnire's disease.  The patient has not responded to low-salt diet and Dyazide diuretic.  Her diuretic was previously discontinued by her primary care physician. 2.  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 3.  Her ear canals, tympanic membranes, and middle ear spaces are all normal.  No middle ear effusion or infection is noted.  Plan  1.  The physical exam findings are reviewed with the patient. 2.  The pathophysiology and clinical course of Mnire's disease are extensively discussed with the patient.  Questions were invited and answered. 3.  Continue with her 1500 mg low-salt diet. 4.  The treatment option of endolymphatic sac decompression surgery is discussed with the patient.  The risks, benefits, and details of the procedure are extensively discussed. 5.  In light of her persistent dizziness, she would like to proceed with the surgery.  We will need to obtain a temporal bone CT scan prior to the surgery.

## 2023-04-08 ENCOUNTER — Other Ambulatory Visit: Payer: Self-pay

## 2023-04-12 ENCOUNTER — Other Ambulatory Visit: Payer: Self-pay

## 2023-04-15 ENCOUNTER — Telehealth: Payer: Self-pay | Admitting: Pharmacist

## 2023-04-15 ENCOUNTER — Encounter: Payer: Self-pay | Admitting: Physician Assistant

## 2023-04-15 ENCOUNTER — Other Ambulatory Visit (HOSPITAL_COMMUNITY): Payer: Self-pay

## 2023-04-15 ENCOUNTER — Ambulatory Visit: Payer: Medicare PPO | Attending: Physician Assistant | Admitting: Physician Assistant

## 2023-04-15 ENCOUNTER — Other Ambulatory Visit: Payer: Self-pay

## 2023-04-15 VITALS — BP 139/81 | HR 76 | Resp 13 | Ht 60.0 in | Wt 108.4 lb

## 2023-04-15 DIAGNOSIS — M810 Age-related osteoporosis without current pathological fracture: Secondary | ICD-10-CM

## 2023-04-15 DIAGNOSIS — Z8781 Personal history of (healed) traumatic fracture: Secondary | ICD-10-CM

## 2023-04-15 DIAGNOSIS — M19041 Primary osteoarthritis, right hand: Secondary | ICD-10-CM | POA: Diagnosis not present

## 2023-04-15 DIAGNOSIS — Z79899 Other long term (current) drug therapy: Secondary | ICD-10-CM

## 2023-04-15 DIAGNOSIS — Z5181 Encounter for therapeutic drug level monitoring: Secondary | ICD-10-CM

## 2023-04-15 DIAGNOSIS — M5030 Other cervical disc degeneration, unspecified cervical region: Secondary | ICD-10-CM

## 2023-04-15 DIAGNOSIS — M1711 Unilateral primary osteoarthritis, right knee: Secondary | ICD-10-CM

## 2023-04-15 DIAGNOSIS — E2839 Other primary ovarian failure: Secondary | ICD-10-CM

## 2023-04-15 DIAGNOSIS — E2740 Unspecified adrenocortical insufficiency: Secondary | ICD-10-CM

## 2023-04-15 DIAGNOSIS — Z8719 Personal history of other diseases of the digestive system: Secondary | ICD-10-CM

## 2023-04-15 DIAGNOSIS — Z8709 Personal history of other diseases of the respiratory system: Secondary | ICD-10-CM

## 2023-04-15 DIAGNOSIS — E785 Hyperlipidemia, unspecified: Secondary | ICD-10-CM

## 2023-04-15 DIAGNOSIS — E559 Vitamin D deficiency, unspecified: Secondary | ICD-10-CM | POA: Diagnosis not present

## 2023-04-15 DIAGNOSIS — M19042 Primary osteoarthritis, left hand: Secondary | ICD-10-CM

## 2023-04-15 MED ORDER — DENOSUMAB 60 MG/ML ~~LOC~~ SOSY
60.0000 mg | PREFILLED_SYRINGE | SUBCUTANEOUS | 0 refills | Status: DC
  Filled 2023-04-15: qty 1, 180d supply, fill #0

## 2023-04-15 NOTE — Telephone Encounter (Signed)
Patient due for Prolia. Per test claim, copay is $0 for Prolia  Appt scheduled for 04/29/23  Chesley Mires, PharmD, MPH, BCPS, CPP Clinical Pharmacist (Rheumatology and Pulmonology)

## 2023-04-15 NOTE — Progress Notes (Signed)
Specialty Pharmacy Refill Coordination Note  Megan Logan is a 82 y.o. female assessed today regarding refills of clinic administered specialty medication(s) Denosumab   Clinic requested Courier to Provider Office   Delivery date: 04/25/23   Verified address: [facility], [address] [place], [place], [address]   Medication will be filled on 04/24/23.

## 2023-04-16 LAB — VITAMIN D 25 HYDROXY (VIT D DEFICIENCY, FRACTURES): Vit D, 25-Hydroxy: 34 ng/mL (ref 30–100)

## 2023-04-16 LAB — CALCIUM, IONIZED: Calcium, Ion: 5.2 mg/dL (ref 4.7–5.5)

## 2023-04-16 NOTE — Progress Notes (Signed)
Vitamin D WNL

## 2023-04-16 NOTE — Progress Notes (Signed)
Calcium WNL   Ok proceed with scheduling prolia

## 2023-04-24 ENCOUNTER — Other Ambulatory Visit: Payer: Self-pay

## 2023-04-25 ENCOUNTER — Other Ambulatory Visit: Payer: Self-pay | Admitting: Physician Assistant

## 2023-04-25 ENCOUNTER — Other Ambulatory Visit: Payer: Self-pay

## 2023-04-25 ENCOUNTER — Other Ambulatory Visit (HOSPITAL_COMMUNITY): Payer: Self-pay | Admitting: Pharmacy Technician

## 2023-04-25 ENCOUNTER — Other Ambulatory Visit (HOSPITAL_COMMUNITY): Payer: Self-pay

## 2023-04-25 DIAGNOSIS — M810 Age-related osteoporosis without current pathological fracture: Secondary | ICD-10-CM

## 2023-04-25 MED ORDER — TERIPARATIDE 600 MCG/2.4ML ~~LOC~~ SOPN
20.0000 ug | PEN_INJECTOR | Freq: Every day | SUBCUTANEOUS | 4 refills | Status: DC
  Filled 2023-04-25: qty 2.4, 28d supply, fill #0
  Filled 2023-05-22: qty 2.4, 28d supply, fill #1
  Filled 2023-06-20: qty 2.4, 28d supply, fill #2
  Filled 2023-07-16: qty 2.4, 28d supply, fill #3
  Filled 2023-08-13: qty 2.24, 28d supply, fill #4

## 2023-04-25 MED ORDER — INSULIN PEN NEEDLE 31G X 5 MM MISC
0 refills | Status: DC
  Filled 2023-04-25 – 2023-05-13 (×2): qty 100, 100d supply, fill #0

## 2023-04-25 NOTE — Progress Notes (Signed)
Specialty Pharmacy Refill Coordination Note  Megan Logan is a 82 y.o. female contacted today regarding refills of specialty medication(s) Teriparatide   Patient requested Delivery   Delivery date: 05/03/23   Verified address: [address]  [place]   Medication will be filled on 05/02/23.

## 2023-04-25 NOTE — Telephone Encounter (Signed)
Last Fill: 11/21/2022  Labs: 03/12/2023 Total Protein 6.1, MCH 32.8, 04/15/2023 Calcium WNL   Next Visit: 10/16/2022  Last Visit: 04/15/2023  DX: Age-related osteoporosis without current pathological fracture   Current Dose per office note 04/15/2023: She will remain on Forteo daily injections.   Okay to refill Forteo?

## 2023-04-29 ENCOUNTER — Ambulatory Visit: Payer: Medicare PPO | Attending: Rheumatology | Admitting: Pharmacist

## 2023-04-29 ENCOUNTER — Other Ambulatory Visit: Payer: Self-pay

## 2023-04-29 DIAGNOSIS — Z7689 Persons encountering health services in other specified circumstances: Secondary | ICD-10-CM

## 2023-04-29 DIAGNOSIS — Z79899 Other long term (current) drug therapy: Secondary | ICD-10-CM

## 2023-04-29 DIAGNOSIS — M810 Age-related osteoporosis without current pathological fracture: Secondary | ICD-10-CM | POA: Diagnosis not present

## 2023-04-29 MED ORDER — DENOSUMAB 60 MG/ML ~~LOC~~ SOSY
60.0000 mg | PREFILLED_SYRINGE | Freq: Once | SUBCUTANEOUS | Status: AC
  Administered 2023-04-29: 60 mg via SUBCUTANEOUS

## 2023-04-29 NOTE — Progress Notes (Addendum)
Pharmacy Note  Subjective:   Patient presents to clinic today to receive bi-annual dose of Prolia. Patient's last dose of Prolia was on 09/26/22. She is also currently taking Forteo (started 11/21/2022).  Patient saw ortho this morning - they are not recommending surgery but are recommending her to start opioid transdermal patches to manage the chronic pain.   Patient running a fever or have signs/symptoms of infection? No  Patient currently on antibiotics for the treatment of infection? No  Patient had fall in the last 6 months?  No   Patient taking calcium 1200 mg daily through diet or supplement and at least 800 units vitamin D? Yes  Objective: CMP     Component Value Date/Time   NA 138 07/31/2022 1108   NA 138 01/23/2022 1512   K 4.4 07/31/2022 1108   CL 102 07/31/2022 1108   CO2 29 07/31/2022 1108   GLUCOSE 100 (H) 07/31/2022 1108   BUN 23 07/31/2022 1108   BUN 18 01/23/2022 1512   CREATININE 0.73 07/31/2022 1108   CALCIUM 9.4 07/31/2022 1108   PROT 6.1 07/31/2022 1108   PROT 5.8 (L) 01/23/2022 1512   ALBUMIN 4.3 01/23/2022 1512   AST 21 07/31/2022 1108   ALT 15 07/31/2022 1108   ALKPHOS 57 01/23/2022 1512   BILITOT 0.5 07/31/2022 1108   BILITOT 0.3 01/23/2022 1512   GFRNONAA 75 07/14/2020 1031   GFRAA 87 07/14/2020 1031    CBC    Component Value Date/Time   WBC 7.7 01/23/2022 1513   WBC 8.0 07/17/2021 1348   RBC 3.84 01/23/2022 1513   RBC 4.18 07/17/2021 1348   HGB 11.7 01/23/2022 1513   HGB 12.4 08/06/2013 1128   HCT 35.8 01/23/2022 1513   PLT 285 01/23/2022 1513   MCV 93 01/23/2022 1513   MCH 30.5 01/23/2022 1513   MCH 31.1 07/17/2021 1348   MCHC 32.7 01/23/2022 1513   MCHC 32.7 07/17/2021 1348   RDW 12.2 01/23/2022 1513   LYMPHSABS 1.1 01/23/2022 1513   EOSABS 0.0 01/23/2022 1513   BASOSABS 0.0 01/23/2022 1513    Lab Results  Component Value Date   VD25OH 34 04/15/2023   T-score:  Previous DEXA 05/04/20: BMD as determined from AP Spine L1-L4  (L2,L3) is 0.820 g/cm2 with a T-Score of -2.9-Osteoporosis.  Updated DEXA 08/17/2022 The BMD measured at Femur Neck Left is 0.704 g/cm2 with a T-score of-2.4.  Assessment/Plan:   Reviewed importance of adequate dietary intake of calcium in addition to supplementation due to risk of hypocalcemia with Prolia.   Patient tolerated injection well.   Administration details as below: Administrations This Visit     denosumab (PROLIA) injection 60 mg     Admin Date 04/29/2023 Action Given Dose 60 mg Route Subcutaneous Documented By Murrell Redden, RPH-CPP           Patient's next Prolia dose is due on 10/26/2023.  Patient is due for updated DEXA in April 2026.   All questions encouraged and answered.  Instructed patient to call with any further questions or concerns.   Chesley Mires, PharmD, MPH, BCPS, CPP Clinical Pharmacist (Rheumatology and Pulmonology)

## 2023-04-29 NOTE — Patient Instructions (Signed)
Continue Forteo once daily  Next Prolia is dye 10/26/2023  Next bone density is due to be updated April 2026

## 2023-05-03 ENCOUNTER — Other Ambulatory Visit (HOSPITAL_COMMUNITY): Payer: Self-pay

## 2023-05-13 ENCOUNTER — Other Ambulatory Visit: Payer: Self-pay

## 2023-05-13 ENCOUNTER — Other Ambulatory Visit (HOSPITAL_COMMUNITY): Payer: Self-pay

## 2023-05-15 ENCOUNTER — Other Ambulatory Visit: Payer: Self-pay

## 2023-05-16 ENCOUNTER — Other Ambulatory Visit: Payer: Self-pay

## 2023-05-21 ENCOUNTER — Other Ambulatory Visit: Payer: Self-pay

## 2023-05-21 ENCOUNTER — Other Ambulatory Visit (HOSPITAL_COMMUNITY): Payer: Self-pay | Admitting: Family Medicine

## 2023-05-21 DIAGNOSIS — Z1231 Encounter for screening mammogram for malignant neoplasm of breast: Secondary | ICD-10-CM

## 2023-05-22 ENCOUNTER — Other Ambulatory Visit: Payer: Self-pay

## 2023-05-22 NOTE — Progress Notes (Signed)
 Specialty Pharmacy Refill Coordination Note  Megan Logan is a 83 y.o. female contacted today regarding refills of specialty medication(s) Teriparatide ; Denosumab  (PROLIA )   Patient requested Delivery   Delivery date: 05/31/23   Verified address: [address]   Medication will be filled on 05/30/23.

## 2023-05-22 NOTE — Progress Notes (Signed)
 Specialty Pharmacy Ongoing Clinical Assessment Note  Megan Logan is a 83 y.o. female who is being followed by the specialty pharmacy service for RxSp Osteoporosis   Patient's specialty medication(s) reviewed today: Teriparatide ; Denosumab  (PROLIA )   Missed doses in the last 4 weeks: 0   Patient/Caregiver did not have any additional questions or concerns.   Therapeutic benefit summary: Unable to assess   Adverse events/side effects summary: No adverse events/side effects   Patient's therapy is appropriate to: Continue    Goals Addressed             This Visit's Progress    Stabilization of disease       Patient is on track. Patient will maintain adherence         Follow up:  6 months  Tollie Fought Specialty Pharmacist

## 2023-05-28 ENCOUNTER — Other Ambulatory Visit (HOSPITAL_COMMUNITY): Payer: Self-pay

## 2023-05-30 ENCOUNTER — Other Ambulatory Visit: Payer: Self-pay

## 2023-05-30 ENCOUNTER — Telehealth: Payer: Self-pay | Admitting: *Deleted

## 2023-05-30 NOTE — Telephone Encounter (Signed)
Received a fax regarding Prior Authorization from [facility] for  Insupen Needle 31 G 5 MM . Authorization has been DENIED.

## 2023-05-30 NOTE — Telephone Encounter (Signed)
Submitted a Prior Authorization request to [facility] for Insupen Needle 31 G 5 MM via CoverMyMeds. Will update once we receive a response.

## 2023-05-31 ENCOUNTER — Ambulatory Visit (HOSPITAL_COMMUNITY): Payer: Medicare PPO

## 2023-06-07 ENCOUNTER — Ambulatory Visit (HOSPITAL_COMMUNITY): Payer: Medicare PPO

## 2023-06-11 ENCOUNTER — Other Ambulatory Visit (HOSPITAL_COMMUNITY): Payer: Self-pay

## 2023-06-14 ENCOUNTER — Ambulatory Visit (HOSPITAL_COMMUNITY): Payer: Medicare PPO

## 2023-06-19 ENCOUNTER — Other Ambulatory Visit: Payer: Self-pay

## 2023-06-20 ENCOUNTER — Other Ambulatory Visit: Payer: Self-pay

## 2023-06-20 NOTE — Progress Notes (Signed)
Specialty Pharmacy Refill Coordination Note  Megan Logan is a 83 y.o. female contacted today regarding refills of specialty medication(s) Teriparatide   Patient requested Delivery   Delivery date: 06/21/23   Verified address: [address]   Medication will be filled on 02.13.25.   Patient provided updated copay information. Added to [employer].

## 2023-06-27 ENCOUNTER — Ambulatory Visit (HOSPITAL_COMMUNITY): Payer: Medicare PPO

## 2023-07-04 ENCOUNTER — Ambulatory Visit (HOSPITAL_COMMUNITY): Payer: Medicare PPO

## 2023-07-11 ENCOUNTER — Ambulatory Visit (HOSPITAL_COMMUNITY): Payer: Medicare PPO

## 2023-07-16 ENCOUNTER — Telehealth (INDEPENDENT_AMBULATORY_CARE_PROVIDER_SITE_OTHER): Payer: Self-pay | Admitting: Otolaryngology

## 2023-07-16 ENCOUNTER — Other Ambulatory Visit: Payer: Self-pay

## 2023-07-16 NOTE — Progress Notes (Signed)
 Specialty Pharmacy Refill Coordination Note  Megan Logan is a 83 y.o. female contacted today regarding refills of specialty medication(s) Teriparatide   Patient requested (Patient-Rptd) Delivery   Delivery date: (Patient-Rptd) 07/23/23   Verified address: (Patient-Rptd) [address] [place] [address]   Medication will be filled on 03.17.25.

## 2023-07-16 NOTE — Telephone Encounter (Signed)
 Patient rescheduled her appointment with Dr. Suszanne Conners.  Needs to get scan done, however, precert has expired.  Patient requests that the precert get updated approval and then notify her so that she may then schedule imaging.

## 2023-07-17 ENCOUNTER — Telehealth (INDEPENDENT_AMBULATORY_CARE_PROVIDER_SITE_OTHER): Payer: Self-pay | Admitting: Otolaryngology

## 2023-07-17 NOTE — Telephone Encounter (Signed)
 Called patient to inform her that CT scan has been authorized for [facility]. Patient given number for Central scheduling - patient calling to set up scan.

## 2023-07-19 ENCOUNTER — Other Ambulatory Visit (HOSPITAL_COMMUNITY): Payer: Self-pay

## 2023-07-19 ENCOUNTER — Other Ambulatory Visit: Payer: Self-pay

## 2023-07-22 ENCOUNTER — Other Ambulatory Visit: Payer: Self-pay

## 2023-07-24 ENCOUNTER — Telehealth (INDEPENDENT_AMBULATORY_CARE_PROVIDER_SITE_OTHER): Payer: Self-pay | Admitting: Otolaryngology

## 2023-07-24 NOTE — Telephone Encounter (Signed)
 Patient called and stated that she had 9 episodes of dizziness since 07/16/23 and even had 3 in one day. She has been taking her Meclezine, Zofran p.r.n.and keeping up with her low salt diet. Her temporal CT scan in scheduled for 07/26/23. She will f/u with dr. Suszanne Conners on 09/04/23.

## 2023-07-26 ENCOUNTER — Ambulatory Visit (HOSPITAL_COMMUNITY)
Admission: RE | Admit: 2023-07-26 | Discharge: 2023-07-26 | Disposition: A | Discharge status: Home / Self Care | Source: Ambulatory Visit | Attending: Otolaryngology | Admitting: Otolaryngology

## 2023-07-26 DIAGNOSIS — H81399 Other peripheral vertigo, unspecified ear: Secondary | ICD-10-CM | POA: Insufficient documentation

## 2023-08-13 ENCOUNTER — Other Ambulatory Visit: Payer: Self-pay

## 2023-08-13 ENCOUNTER — Other Ambulatory Visit: Payer: Self-pay | Admitting: Rheumatology

## 2023-08-13 ENCOUNTER — Other Ambulatory Visit (HOSPITAL_COMMUNITY): Payer: Self-pay

## 2023-08-13 MED ORDER — INSUPEN PEN NEEDLES 31G X 5 MM MISC
0 refills | Status: DC
  Filled 2023-08-13: qty 100, 90d supply, fill #0

## 2023-08-13 NOTE — Telephone Encounter (Signed)
 Last Fill: 04/25/2023  Next Visit: 10/16/2023  Last Visit: 04/15/2023  Dx: Age-related osteoporosis without current pathological fracture   Current Dose per office note on 04/15/2023: She will remain on Forteo daily injections.   Okay to refill Pen needles?

## 2023-08-13 NOTE — Progress Notes (Signed)
 Specialty Pharmacy Refill Coordination Note  Megan Logan is a 83 y.o. female contacted today regarding refills of specialty medication(s) Teriparatide   Patient requested (Patient-Rptd) Delivery   Delivery date: (Patient-Rptd) 08/20/23   Verified address: (Patient-Rptd) [address] [place] [address]   Medication will be filled on 04.14.25.

## 2023-08-14 ENCOUNTER — Other Ambulatory Visit: Payer: Self-pay

## 2023-08-22 ENCOUNTER — Other Ambulatory Visit: Payer: Self-pay

## 2023-08-22 MED ORDER — TERIPARATIDE 560 MCG/2.24ML ~~LOC~~ SOPN
PEN_INJECTOR | SUBCUTANEOUS | 0 refills | Status: DC
Start: 2023-04-25 — End: 2023-09-11
  Filled 2023-08-22: qty 2.24, 28d supply, fill #0

## 2023-08-22 NOTE — Progress Notes (Signed)
 Patient called because she had not received shipment. Prescription was lost in [facility] due to manufacturer package and label changes. New prescription generated by [facility]oss[facility]kes, and will ship 4/17. Patient is aware.

## 2023-09-04 ENCOUNTER — Ambulatory Visit (INDEPENDENT_AMBULATORY_CARE_PROVIDER_SITE_OTHER)

## 2023-09-04 ENCOUNTER — Other Ambulatory Visit (HOSPITAL_COMMUNITY): Payer: Self-pay

## 2023-09-04 VITALS — BP 147/76 | HR 85 | Wt 97.0 lb

## 2023-09-04 DIAGNOSIS — H9311 Tinnitus, right ear: Secondary | ICD-10-CM | POA: Diagnosis not present

## 2023-09-04 DIAGNOSIS — R42 Dizziness and giddiness: Secondary | ICD-10-CM

## 2023-09-04 DIAGNOSIS — H903 Sensorineural hearing loss, bilateral: Secondary | ICD-10-CM

## 2023-09-04 DIAGNOSIS — H8101 Meniere's disease, right ear: Secondary | ICD-10-CM | POA: Diagnosis not present

## 2023-09-04 DIAGNOSIS — H9041 Sensorineural hearing loss, unilateral, right ear, with unrestricted hearing on the contralateral side: Secondary | ICD-10-CM

## 2023-09-05 NOTE — Progress Notes (Signed)
 Patient ID: Megan Logan, female   DOB: 10/22/1940, 82 y.o.   MRN: [id number]  Follow-up: Recurrent dizziness, asymmetric right ear hearing loss, Mnire's disease  HPI: The patient is an 83 year old female who returns today for follow-up evaluation.  The patient has a history of frequent recurrent dizziness, asymmetric right ear hearing loss, right ear tinnitus, and right aural pressure.  She was diagnosed with right ear Mnire's disease.  She was treated with low-salt diet and Dyazide diuretic.  The patient could not tolerate the use of thiazide diuretic due to her dehydration.  The patient returns today complaining of increasing dizziness over the past 6 months.  She describes her dizziness as a spinning vertigo the last 4 hours.  She has had approximately 26 episodes of vertigo since February 2025.  She has not responded to the low-salt diet.  She continues to have fluctuating right ear hearing loss, right ear tinnitus, and right ear pressure.  Exam: General: Communicates without difficulty, well nourished, no acute distress. Head: Normocephalic, no evidence injury, no tenderness, facial buttresses intact without stepoff. Face/sinus: No tenderness to palpation and percussion. Facial movement is normal and symmetric. Eyes: PERRL, EOMI. No scleral icterus, conjunctivae clear. Neuro: CN II exam reveals vision grossly intact.  No nystagmus at any point of gaze. Ears: Auricles well formed without lesions. The ear canals and tympanic membranes are noted to be normal. Nose: External evaluation reveals normal support and skin without lesions.  Dorsum is intact.  Anterior rhinoscopy reveals congested mucosa over anterior aspect of inferior turbinates and intact septum.  No purulence noted. Oral:  Oral cavity and oropharynx are intact, symmetric, without erythema or edema.  Mucosa is moist without lesions. Neck: Full range of motion without pain.  There is no significant lymphadenopathy.  No masses palpable.   Thyroid bed within normal limits to palpation.  Parotid glands and submandibular glands equal bilaterally without mass.  Trachea is midline. Neuro:  CN 2-12 grossly intact. Gait wide-based. Vestibular: No nystagmus at any point of gaze. Dix Hallpike negative. Vestibular: There is no nystagmus with pneumatic pressure on either tympanic membrane or Valsalva. The cerebellar examination is unremarkable.    Assessment 1.  Recurrent dizziness, likely a result of her right ear Mnire's disease.  The patient has not responded to low-salt diet and Dyazide diuretic.  2.  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 3.  Her ear canals, tympanic membranes, and middle ear spaces are all normal.  No middle ear effusion or infection is noted.   Plan  1.  The physical exam findings are reviewed with the patient. 2.  The pathophysiology and clinical course of Mnire's disease are again discussed with the patient.  Questions were invited and answered. 3.  Continue with her 1500 mg low-salt diet. 4.  The treatment option of endolymphatic sac decompression surgery is discussed with the patient.  The risks, benefits, and details of the procedure are extensively discussed. 5.  The patient would like to proceed with the surgery.  We will schedule the procedure in accordance with the family schedule.

## 2023-09-09 ENCOUNTER — Encounter (HOSPITAL_BASED_OUTPATIENT_CLINIC_OR_DEPARTMENT_OTHER): Payer: Self-pay | Admitting: Otolaryngology

## 2023-09-10 ENCOUNTER — Encounter (HOSPITAL_BASED_OUTPATIENT_CLINIC_OR_DEPARTMENT_OTHER)
Admission: RE | Admit: 2023-09-10 | Discharge: 2023-09-10 | Disposition: A | Discharge status: Home / Self Care | Source: Ambulatory Visit | Attending: Otolaryngology | Admitting: Otolaryngology

## 2023-09-10 DIAGNOSIS — Z01818 Encounter for other preprocedural examination: Secondary | ICD-10-CM | POA: Insufficient documentation

## 2023-09-11 ENCOUNTER — Other Ambulatory Visit: Payer: Self-pay | Admitting: Rheumatology

## 2023-09-11 ENCOUNTER — Other Ambulatory Visit: Payer: Self-pay | Admitting: *Deleted

## 2023-09-11 ENCOUNTER — Other Ambulatory Visit: Payer: Self-pay

## 2023-09-11 DIAGNOSIS — Z79899 Other long term (current) drug therapy: Secondary | ICD-10-CM

## 2023-09-11 MED ORDER — TERIPARATIDE 560 MCG/2.24ML ~~LOC~~ SOPN
PEN_INJECTOR | SUBCUTANEOUS | 0 refills | Status: DC
  Filled 2023-09-11: qty 2.24, 28d supply, fill #0

## 2023-09-11 NOTE — Telephone Encounter (Signed)
 Last Fill: 04/25/2023  Labs: 03/12/2023 MCHC 32.8 Potassium 5.2 Blood Urea Nitrogen 27 Bilirubin 1.1 .   04/15/2023 Vitamin D  WNL   Next Visit: 10/16/2023  Last Visit: 04/15/2023  DX:  Age-related osteoporosis without current pathological fracture   Current Dose per office note 04/15/2023: Dose not mentioned  Patient advised that labs are due an that she will go to [facility] this week to do so.   Okay to refill Forteo ?

## 2023-09-11 NOTE — Progress Notes (Signed)
 Specialty Pharmacy Refill Coordination Note  Megan Logan is a 83 y.o. female contacted today regarding refills of specialty medication(s) Teriparatide    Patient requested Delivery   Delivery date: 09/18/23   Verified address: [address] [place] [address]   Medication will be filled on 05.13.25.   This fill date is pending response to refill request from provider. Patient is aware and if they have not received fill by intended date they must follow up with pharmacy.

## 2023-09-13 LAB — COMPREHENSIVE METABOLIC PANEL WITH GFR
ALT: 8 IU/L (ref 0–32)
AST: 21 IU/L (ref 0–40)
Albumin: 4.6 g/dL (ref 3.7–4.7)
Alkaline Phosphatase: 59 IU/L (ref 44–121)
BUN/Creatinine Ratio: 23 (ref 12–28)
BUN: 18 mg/dL (ref 8–27)
Bilirubin Total: 0.5 mg/dL (ref 0.0–1.2)
CO2: 24 mmol/L (ref 20–29)
Calcium: 9.6 mg/dL (ref 8.7–10.3)
Chloride: 101 mmol/L (ref 96–106)
Creatinine, Ser: 0.78 mg/dL (ref 0.57–1.00)
Globulin, Total: 1.1 g/dL — ABNORMAL LOW (ref 1.5–4.5)
Glucose: 94 mg/dL (ref 70–99)
Potassium: 4.6 mmol/L (ref 3.5–5.2)
Sodium: 137 mmol/L (ref 134–144)
Total Protein: 5.7 g/dL — ABNORMAL LOW (ref 6.0–8.5)
eGFR: 76 mL/min/{1.73_m2} (ref >59)

## 2023-09-13 LAB — CBC WITH DIFFERENTIAL/PLATELET
Basophils Absolute: 0 10*3/uL (ref 0.0–0.2)
Basos: 1 %
EOS (ABSOLUTE): 0.1 10*3/uL (ref 0.0–0.4)
Eos: 1 %
Hematocrit: 37.6 % (ref 34.0–46.6)
Hemoglobin: 12.3 g/dL (ref 11.1–15.9)
Immature Grans (Abs): 0 10*3/uL (ref 0.0–0.1)
Immature Granulocytes: 1 %
Lymphocytes Absolute: 1 10*3/uL (ref 0.7–3.1)
Lymphs: 16 %
MCH: 30.8 pg (ref 26.6–33.0)
MCHC: 32.7 g/dL (ref 31.5–35.7)
MCV: 94 fL (ref 79–97)
Monocytes Absolute: 0.7 10*3/uL (ref 0.1–0.9)
Monocytes: 11 %
Neutrophils Absolute: 4.4 10*3/uL (ref 1.4–7.0)
Neutrophils: 70 %
Platelets: 255 10*3/uL (ref 150–450)
RBC: 4 x10E6/uL (ref 3.77–5.28)
RDW: 13.2 % (ref 11.7–15.4)
WBC: 6.2 10*3/uL (ref 3.4–10.8)

## 2023-09-13 NOTE — Progress Notes (Signed)
 CBC and CMP are normal.

## 2023-09-16 ENCOUNTER — Encounter (HOSPITAL_BASED_OUTPATIENT_CLINIC_OR_DEPARTMENT_OTHER)
Admission: RE | Disposition: A | Discharge status: Home / Self Care | Payer: Self-pay | Source: Home / Self Care | Attending: Otolaryngology

## 2023-09-16 ENCOUNTER — Other Ambulatory Visit: Payer: Self-pay

## 2023-09-16 ENCOUNTER — Ambulatory Visit (HOSPITAL_BASED_OUTPATIENT_CLINIC_OR_DEPARTMENT_OTHER)
Admission: RE | Admit: 2023-09-16 | Discharge: 2023-09-16 | Disposition: A | Discharge status: Home / Self Care | Attending: Otolaryngology | Admitting: Otolaryngology

## 2023-09-16 ENCOUNTER — Encounter (HOSPITAL_BASED_OUTPATIENT_CLINIC_OR_DEPARTMENT_OTHER): Payer: Self-pay | Admitting: Otolaryngology

## 2023-09-16 ENCOUNTER — Ambulatory Visit (HOSPITAL_BASED_OUTPATIENT_CLINIC_OR_DEPARTMENT_OTHER): Admitting: Anesthesiology

## 2023-09-16 DIAGNOSIS — R42 Dizziness and giddiness: Secondary | ICD-10-CM | POA: Diagnosis not present

## 2023-09-16 DIAGNOSIS — J45909 Unspecified asthma, uncomplicated: Secondary | ICD-10-CM | POA: Diagnosis not present

## 2023-09-16 DIAGNOSIS — H9311 Tinnitus, right ear: Secondary | ICD-10-CM | POA: Diagnosis not present

## 2023-09-16 DIAGNOSIS — E860 Dehydration: Secondary | ICD-10-CM | POA: Diagnosis not present

## 2023-09-16 DIAGNOSIS — E2740 Unspecified adrenocortical insufficiency: Secondary | ICD-10-CM | POA: Insufficient documentation

## 2023-09-16 DIAGNOSIS — M1990 Unspecified osteoarthritis, unspecified site: Secondary | ICD-10-CM | POA: Diagnosis not present

## 2023-09-16 DIAGNOSIS — H9041 Sensorineural hearing loss, unilateral, right ear, with unrestricted hearing on the contralateral side: Secondary | ICD-10-CM | POA: Insufficient documentation

## 2023-09-16 DIAGNOSIS — I10 Essential (primary) hypertension: Secondary | ICD-10-CM | POA: Insufficient documentation

## 2023-09-16 DIAGNOSIS — H8101 Meniere's disease, right ear: Secondary | ICD-10-CM | POA: Diagnosis not present

## 2023-09-16 DIAGNOSIS — K219 Gastro-esophageal reflux disease without esophagitis: Secondary | ICD-10-CM | POA: Diagnosis not present

## 2023-09-16 DIAGNOSIS — Z01818 Encounter for other preprocedural examination: Secondary | ICD-10-CM

## 2023-09-16 DIAGNOSIS — D649 Anemia, unspecified: Secondary | ICD-10-CM | POA: Diagnosis not present

## 2023-09-16 HISTORY — PX: ENDOLYMPHATIC SAC DECOMPRESSION: SHX6529

## 2023-09-16 SURGERY — DECOMPRESSION, ENDOLYMPHATIC SAC
Anesthesia: General | Site: Ear | Laterality: Right

## 2023-09-16 MED ORDER — PROPOFOL 10 MG/ML IV BOLUS
INTRAVENOUS | Status: AC
  Filled 2023-09-16: qty 20

## 2023-09-16 MED ORDER — EPHEDRINE 5 MG/ML INJ
INTRAVENOUS | Status: AC
  Filled 2023-09-16: qty 5

## 2023-09-16 MED ORDER — SUCCINYLCHOLINE CHLORIDE 200 MG/10ML IV SOSY
PREFILLED_SYRINGE | INTRAVENOUS | Status: DC | PRN
  Administered 2023-09-16: 80 mg via INTRAVENOUS

## 2023-09-16 MED ORDER — DEXAMETHASONE SODIUM PHOSPHATE 10 MG/ML IJ SOLN
INTRAMUSCULAR | Status: AC
  Filled 2023-09-16: qty 1

## 2023-09-16 MED ORDER — EPHEDRINE SULFATE (PRESSORS) 50 MG/ML IJ SOLN
INTRAMUSCULAR | Status: DC | PRN
  Administered 2023-09-16: 5 mg via INTRAVENOUS

## 2023-09-16 MED ORDER — LACTATED RINGERS IV SOLN: INTRAVENOUS | Status: DC

## 2023-09-16 MED ORDER — CIPROFLOXACIN-DEXAMETHASONE 0.3-0.1 % OT SUSP
OTIC | Status: AC
  Filled 2023-09-16: qty 7.5

## 2023-09-16 MED ORDER — LIDOCAINE-EPINEPHRINE 1 %-1:100000 IJ SOLN
INTRAMUSCULAR | Status: DC | PRN
  Administered 2023-09-16: 4 mL

## 2023-09-16 MED ORDER — OXYMETAZOLINE HCL 0.05 % NA SOLN
NASAL | Status: AC
  Filled 2023-09-16: qty 30

## 2023-09-16 MED ORDER — PROPOFOL 1000 MG/100ML IV EMUL
INTRAVENOUS | Status: AC
  Filled 2023-09-16: qty 100

## 2023-09-16 MED ORDER — FENTANYL CITRATE (PF) 100 MCG/2ML IJ SOLN
INTRAMUSCULAR | Status: AC
  Filled 2023-09-16: qty 2

## 2023-09-16 MED ORDER — ACETAMINOPHEN 10 MG/ML IV SOLN
INTRAVENOUS | Status: AC
  Filled 2023-09-16: qty 100

## 2023-09-16 MED ORDER — EPINEPHRINE PF 1 MG/ML IJ SOLN
INTRAMUSCULAR | Status: AC
  Filled 2023-09-16: qty 1

## 2023-09-16 MED ORDER — LIDOCAINE 2% (20 MG/ML) 5 ML SYRINGE
INTRAMUSCULAR | Status: DC | PRN
  Administered 2023-09-16: 100 mg via INTRAVENOUS

## 2023-09-16 MED ORDER — ACETAMINOPHEN 10 MG/ML IV SOLN
INTRAVENOUS | Status: DC | PRN
  Administered 2023-09-16: 1000 mg via INTRAVENOUS

## 2023-09-16 MED ORDER — LIDOCAINE 2% (20 MG/ML) 5 ML SYRINGE
INTRAMUSCULAR | Status: AC
  Filled 2023-09-16: qty 5

## 2023-09-16 MED ORDER — HYDROCORTISONE SOD SUC (PF) 100 MG IJ SOLR
INTRAMUSCULAR | Status: AC
  Filled 2023-09-16: qty 2

## 2023-09-16 MED ORDER — LIDOCAINE-EPINEPHRINE 1 %-1:100000 IJ SOLN
INTRAMUSCULAR | Status: AC
  Filled 2023-09-16: qty 1

## 2023-09-16 MED ORDER — PROPOFOL 500 MG/50ML IV EMUL
INTRAVENOUS | Status: AC
  Filled 2023-09-16: qty 50

## 2023-09-16 MED ORDER — PROPOFOL 10 MG/ML IV BOLUS
INTRAVENOUS | Status: DC | PRN
  Administered 2023-09-16: 125 ug/kg/min via INTRAVENOUS
  Administered 2023-09-16: 50 mg via INTRAVENOUS
  Administered 2023-09-16: 100 mg via INTRAVENOUS
  Administered 2023-09-16: 150 ug/kg/min via INTRAVENOUS
  Administered 2023-09-16: 50 mg via INTRAVENOUS

## 2023-09-16 MED ORDER — HYDROCORTISONE SOD SUC (PF) 100 MG IJ SOLR
INTRAMUSCULAR | Status: DC | PRN
  Administered 2023-09-16: 100 mg via INTRAVENOUS

## 2023-09-16 MED ORDER — FENTANYL CITRATE (PF) 100 MCG/2ML IJ SOLN: 25.0000 ug | INTRAMUSCULAR | Status: DC | PRN

## 2023-09-16 MED ORDER — ROCURONIUM BROMIDE 10 MG/ML (PF) SYRINGE
PREFILLED_SYRINGE | INTRAVENOUS | Status: AC
  Filled 2023-09-16: qty 10

## 2023-09-16 MED ORDER — ONDANSETRON HCL 4 MG/2ML IJ SOLN
INTRAMUSCULAR | Status: DC | PRN
  Administered 2023-09-16: 4 mg via INTRAVENOUS

## 2023-09-16 MED ORDER — BACITRACIN ZINC 500 UNIT/GM EX OINT
TOPICAL_OINTMENT | CUTANEOUS | Status: AC
  Filled 2023-09-16: qty 28.35

## 2023-09-16 MED ORDER — ONDANSETRON HCL 4 MG PO TABS: 4.0000 mg | ORAL_TABLET | Freq: Three times a day (TID) | ORAL | 4 refills | Status: AC | PRN

## 2023-09-16 MED ORDER — EPINEPHRINE PF 1 MG/ML IJ SOLN
INTRAMUSCULAR | Status: DC | PRN
  Administered 2023-09-16: 1 mL

## 2023-09-16 MED ORDER — ONDANSETRON HCL 4 MG/2ML IJ SOLN
INTRAMUSCULAR | Status: AC
  Filled 2023-09-16: qty 2

## 2023-09-16 MED ORDER — CEFAZOLIN SODIUM-DEXTROSE 1-4 GM/50ML-% IV SOLN
INTRAVENOUS | Status: DC | PRN
Start: 2023-09-16 — End: 2023-09-16
  Administered 2023-09-16: 1 g via INTRAVENOUS

## 2023-09-16 MED ORDER — ACETAMINOPHEN 500 MG PO TABS: 1000.0000 mg | ORAL_TABLET | Freq: Once | ORAL | Status: DC

## 2023-09-16 MED ORDER — FENTANYL CITRATE (PF) 250 MCG/5ML IJ SOLN
INTRAMUSCULAR | Status: DC | PRN
  Administered 2023-09-16 (×2): 50 ug via INTRAVENOUS

## 2023-09-16 MED ORDER — CEFAZOLIN SODIUM-DEXTROSE 1-4 GM/50ML-% IV SOLN
INTRAVENOUS | Status: AC
  Filled 2023-09-16: qty 50

## 2023-09-16 SURGICAL SUPPLY — 46 items
BLADE CLIPPER SURG (BLADE) IMPLANT
BUR DIAMOND COARSE 3.0 (BURR) IMPLANT
BUR RND OSTEON ELITE 6.0 (BURR) IMPLANT
CANISTER SUCT 1200ML W/VALVE (MISCELLANEOUS) ×2 IMPLANT
CORD BIPOLAR FORCEPS 12FT (ELECTRODE) ×2 IMPLANT
COTTONBALL LRG STERILE PKG (GAUZE/BANDAGES/DRESSINGS) ×2 IMPLANT
DERMABOND ADVANCED .7 DNX12 (GAUZE/BANDAGES/DRESSINGS) ×2 IMPLANT
DRAPE MICROSCOPE WILD 40.5X102 (DRAPES) ×2 IMPLANT
DRAPE SURG 17X23 STRL (DRAPES) ×2 IMPLANT
DRAPE SURG IRRIG POUCH 19X23 (DRAPES) ×2 IMPLANT
DRSG GLASSCOCK MASTOID ADT (GAUZE/BANDAGES/DRESSINGS) IMPLANT
ELECT COATED BLADE 2.86 ST (ELECTRODE) ×2 IMPLANT
ELECTRODE PAIRED SUBDERMAL (MISCELLANEOUS) ×2 IMPLANT
ELECTRODE REM PT RTRN 9FT ADLT (ELECTROSURGICAL) ×2 IMPLANT
FORCEPS BIPOLAR SPETZLER 8 1.0 (NEUROSURGERY SUPPLIES) ×2 IMPLANT
GAUZE 4X4 16PLY ~~LOC~~+RFID DBL (SPONGE) IMPLANT
GAUZE SPONGE 4X4 12PLY STRL (GAUZE/BANDAGES/DRESSINGS) IMPLANT
GAUZE SPONGE 4X4 12PLY STRL LF (GAUZE/BANDAGES/DRESSINGS) IMPLANT
GLOVE BIO SURGEON STRL SZ7.5 (GLOVE) ×2 IMPLANT
GOWN STRL REUS W/ TWL LRG LVL3 (GOWN DISPOSABLE) ×4 IMPLANT
HEMOSTAT SURGICEL .5X2 ABSORB (HEMOSTASIS) IMPLANT
HEMOSTAT SURGICEL 2X14 (HEMOSTASIS) IMPLANT
IV NS 500ML BAXH (IV SOLUTION) ×2 IMPLANT
IV SET EXT 30 76VOL 4 MALE LL (IV SETS) ×2 IMPLANT
NDL HYPO 25X1 1.5 SAFETY (NEEDLE) ×2 IMPLANT
NDL SAFETY ECLIPSE 18X1.5 (NEEDLE) IMPLANT
NEEDLE HYPO 25X1 1.5 SAFETY (NEEDLE) ×1 IMPLANT
NS IRRIG 1000ML POUR BTL (IV SOLUTION) ×2 IMPLANT
PACK BASIN DAY SURGERY FS (CUSTOM PROCEDURE TRAY) ×2 IMPLANT
PACK ENT DAY SURGERY (CUSTOM PROCEDURE TRAY) ×2 IMPLANT
PAD ALCOHOL SWAB (MISCELLANEOUS) ×4 IMPLANT
PENCIL SMOKE EVACUATOR (MISCELLANEOUS) ×2 IMPLANT
PROBE NERVBE PRASS .33 (MISCELLANEOUS) IMPLANT
SLEEVE IRRIGATION ELITE 7 (MISCELLANEOUS) IMPLANT
SLEEVE SCD COMPRESS KNEE MED (STOCKING) ×2 IMPLANT
SPIKE FLUID TRANSFER (MISCELLANEOUS) ×2 IMPLANT
SPONGE SURGIFOAM ABS GEL 12-7 (HEMOSTASIS) ×2 IMPLANT
SUT BONE WAX W31G (SUTURE) IMPLANT
SUT SILK 3-0 18XBRD TIE BLK (SUTURE) IMPLANT
SUT SILK 4 0 TIES 17X18 (SUTURE) IMPLANT
SUT VIC AB 3-0 FS2 27 (SUTURE) IMPLANT
SUT VIC AB 4-0 PS2 18 (SUTURE) ×2 IMPLANT
SYR BULB EAR ULCER 3OZ GRN STR (SYRINGE) ×2 IMPLANT
TOWEL GREEN STERILE FF (TOWEL DISPOSABLE) ×2 IMPLANT
TRAY DSU PREP LF (CUSTOM PROCEDURE TRAY) ×2 IMPLANT
TUBING IRRIGATION (MISCELLANEOUS) ×2 IMPLANT

## 2023-09-16 NOTE — Anesthesia Postprocedure Evaluation (Signed)
 Anesthesia Post Note  Patient: Megan Logan  Procedure(s) Performed: DECOMPRESSION, ENDOLYMPHATIC SAC (Right: Ear)     Patient location during evaluation: PACU Anesthesia Type: General Level of consciousness: awake and alert Pain management: pain level controlled Vital Signs Assessment: post-procedure vital signs reviewed and stable Respiratory status: spontaneous breathing, nonlabored ventilation and respiratory function stable Cardiovascular status: blood pressure returned to baseline and stable Postop Assessment: no apparent nausea or vomiting Anesthetic complications: no   No notable events documented.  Last Vitals:  Vitals:   09/16/23 1045 09/16/23 1123  BP: (!) 144/80 (!) 170/85  Pulse: 86 83  Resp: 20 20  Temp:  (!) 36.3 C  SpO2: 97% 98%    Last Pain:  Vitals:   09/16/23 1123  TempSrc: Temporal  PainSc: 0-No pain                 Shamaya Kauer,W. EDMOND

## 2023-09-16 NOTE — Anesthesia Preprocedure Evaluation (Addendum)
 Anesthesia Evaluation  Patient identified by MRN, date of birth, ID band Patient awake    Reviewed: Allergy & Precautions, H&P , NPO status , Patient's Chart, lab work & pertinent test results  History of Anesthesia Complications (+) PONV and history of anesthetic complications  Airway Mallampati: II  TM Distance: >3 FB Neck ROM: Full    Dental no notable dental hx. (+) Teeth Intact, Dental Advisory Given   Pulmonary asthma    Pulmonary exam normal breath sounds clear to auscultation       Cardiovascular hypertension, Pt. on medications  Rhythm:Regular Rate:Normal     Neuro/Psych negative neurological ROS  negative psych ROS   GI/Hepatic Neg liver ROS,GERD  Medicated and Controlled,,  Endo/Other  negative endocrine ROS  Adrenal Insufficiency  Renal/GU negative Renal ROS  negative genitourinary   Musculoskeletal  (+) Arthritis ,    Abdominal   Peds  Hematology  (+) Blood dyscrasia, anemia   Anesthesia Other Findings   Reproductive/Obstetrics negative OB ROS                             Anesthesia Physical Anesthesia Plan  ASA: 2  Anesthesia Plan: General   Post-op Pain Management: Tylenol  PO (pre-op)*   Induction: Intravenous  PONV Risk Score and Plan: 4 or greater and Ondansetron , Dexamethasone, Propofol infusion and TIVA  Airway Management Planned: Oral ETT  Additional Equipment:   Intra-op Plan:   Post-operative Plan: Extubation in OR  Informed Consent: I have reviewed the patients History and Physical, chart, labs and discussed the procedure including the risks, benefits and alternatives for the proposed anesthesia with the patient or authorized representative who has indicated his/her understanding and acceptance.     Dental advisory given  Plan Discussed with: CRNA  Anesthesia Plan Comments:        Anesthesia Quick Evaluation

## 2023-09-16 NOTE — H&P (Signed)
 Follow-up: Recurrent dizziness, asymmetric right ear hearing loss, Mnire's disease   HPI: The patient is an 83 year old female who returns today for follow-up evaluation.  The patient has a history of frequent recurrent dizziness, asymmetric right ear hearing loss, right ear tinnitus, and right aural pressure.  She was diagnosed with right ear Mnire's disease.  She was treated with low-salt diet and Dyazide diuretic.  The patient could not tolerate the use of thiazide diuretic due to her dehydration.  The patient returns today complaining of increasing dizziness over the past 6 months.  She describes her dizziness as a spinning vertigo the last for hours.  She has had approximately 26 episodes of vertigo since February 2025.  She has not responded to the low-salt diet.  She continues to have fluctuating right ear hearing loss, right ear tinnitus, and right ear pressure.   Exam: General: Communicates without difficulty, well nourished, no acute distress. Head: Normocephalic, no evidence injury, no tenderness, facial buttresses intact without stepoff. Face/sinus: No tenderness to palpation and percussion. Facial movement is normal and symmetric. Eyes: PERRL, EOMI. No scleral icterus, conjunctivae clear. Neuro: CN II exam reveals vision grossly intact.  No nystagmus at any point of gaze. Ears: Auricles well formed without lesions. The ear canals and tympanic membranes are noted to be normal. Nose: External evaluation reveals normal support and skin without lesions.  Dorsum is intact.  Anterior rhinoscopy reveals congested mucosa over anterior aspect of inferior turbinates and intact septum.  No purulence noted. Oral:  Oral cavity and oropharynx are intact, symmetric, without erythema or edema.  Mucosa is moist without lesions. Neck: Full range of motion without pain.  There is no significant lymphadenopathy.  No masses palpable.  Thyroid bed within normal limits to palpation.  Parotid glands and submandibular  glands equal bilaterally without mass.  Trachea is midline. Neuro:  CN 2-12 grossly intact. Gait wide-based. Vestibular: No nystagmus at any point of gaze. Dix Hallpike negative. Vestibular: There is no nystagmus with pneumatic pressure on either tympanic membrane or Valsalva. The cerebellar examination is unremarkable.    Assessment 1.  Recurrent dizziness, likely a result of her right ear Mnire's disease.  The patient has not responded to low-salt diet and Dyazide diuretic.  2.  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 3.  Her ear canals, tympanic membranes, and middle ear spaces are all normal.  No middle ear effusion or infection is noted.   Plan  1.  The physical exam findings are reviewed with the patient. 2.  The pathophysiology and clinical course of Mnire's disease are again discussed with the patient.  Questions were invited and answered. 3.  Continue with her 1500 mg low-salt diet. 4.  The treatment option of endolymphatic sac decompression surgery is discussed with the patient.  The risks, benefits, and details of the procedure are extensively discussed. 5.  The patient would like to proceed with the surgery.  We will schedule the procedure in accordance with the family schedule.

## 2023-09-16 NOTE — Discharge Instructions (Addendum)
 POSTOPERATIVE INSTRUCTIONS FOR PATIENTS HAVING MASTOIDECTOMY/EAR SURGERY  You may have nausea, vomiting, or a low grade fever for a few days after surgery. This is not unusual.  However, if the nausea and vomiting become severe or last more than one day, please call our office. Medication for nausea may be prescribed. You may take Tylenol  every four hours for fever. If your fever should rise above 101 F, please contact our office. Limit your activities for one week. This includes avoiding heavy lifting (over 20lbs), vigorous exercise, and contact sports. Do not blow your nose for approximately one week.  Any accumulation in the nose should be drawn back into the throat and expectorated through the mouth to avoid infecting the ear. If it is necessary to sneeze, do so with your mouth open to decrease pressure to your ears. Do not hold your nose to avoid sneezing.  You may wash your hair 2 days after the operation. Please protect the ear and any external incision from water . We recommend placing some plastic wrap over the ear and incision to help protect against water . It may be necessary to have someone help you during the first several washings.  Try to keep the incision clean and dry.  Some dull postoperative ear pain is expected. Your physician may prescribe pain medicine to help relieve your discomfort. If your postoperative pain increases and your medication is not helping, please call the office before taking any other medication that we have not prescribed or recommended. If any of the following should occur, contact Dr.Teoh:   (Office: [phone])) Persistent bleeding                                                             Persistent fever Purulent drainage (pus) from the ear or incision Increasing redness around the suture line Persistent pain or dizziness Facial weakness Sometimes, with a larger incision behind the ear, the incision may open and drain. If it occurs, please contact our  office. You may experience some popping and cracking sounds in the ear for up to several weeks. It may sound like you are "talking in a barrel" or a tunnel. This is normal and should not cause concern. Because a nerve for taste passes through your ear, it is not unusual for your taste sensation to be altered for several weeks or months. You may experience some numbness in your outer ear, earlobe, and the incision area. This is normal, and most of the numbness will be expected to fade over a period of time.                                                               Your eardrum may look "pink" or "red" for up to a month postoperatively. The red coloration is due to fluid in the middle ear. The change in color should not be confused with infection.  It is important to return to our office for your postoperative appointment as scheduled. If for some reason you were not given a postoperative appointment, please call our office at [phone].  Post  Anesthesia Home Care Instructions  Activity: Get plenty of rest for the remainder of the day. A responsible individual must stay with you for 24 hours following the procedure.  For the next 24 hours, DO NOT: -Drive a car -Advertising copywriter -Drink alcoholic beverages -Take any medication unless instructed by your physician -Make any legal decisions or sign important papers.  Meals: Start with liquid foods such as gelatin or soup. Progress to regular foods as tolerated. Avoid greasy, spicy, heavy foods. If nausea and/or vomiting occur, drink only clear liquids until the nausea and/or vomiting subsides. Call your physician if vomiting continues.  Special Instructions/Symptoms: Your throat may feel dry or sore from the anesthesia or the breathing tube placed in your throat during surgery. If this causes discomfort, gargle with warm salt water . The discomfort should disappear within 24 hours.  If you had a scopolamine patch placed behind your ear for  the management of post- operative nausea and/or vomiting:  1. The medication in the patch is effective for 72 hours, after which it should be removed.  Wrap patch in a tissue and discard in the trash. Wash hands thoroughly with soap and water . 2. You may remove the patch earlier than 72 hours if you experience unpleasant side effects which may include dry mouth, dizziness or visual disturbances. 3. Avoid touching the patch. Wash your hands with soap and water  after contact with the patch.

## 2023-09-16 NOTE — Anesthesia Procedure Notes (Signed)
 Procedure Name: Intubation Date/Time: 09/16/2023 7:44 AM  Performed by: Adolphus Hoops, CRNAPre-anesthesia Checklist: Patient identified, Emergency Drugs available, Suction available and Patient being monitored Patient Re-evaluated:Patient Re-evaluated prior to induction Oxygen Delivery Method: Circle System Utilized Preoxygenation: Pre-oxygenation with 100% oxygen Induction Type: IV induction Ventilation: Mask ventilation without difficulty Laryngoscope Size: Mac and 3 Grade View: Grade I Tube type: Oral Tube size: 7.0 mm Number of attempts: 1 Airway Equipment and Method: Stylet and Oral airway Placement Confirmation: ETT inserted through vocal cords under direct vision, positive ETCO2 and breath sounds checked- equal and bilateral Secured at: 20 cm Tube secured with: Tape Dental Injury: Teeth and Oropharynx as per pre-operative assessment

## 2023-09-16 NOTE — Transfer of Care (Signed)
 Immediate Anesthesia Transfer of Care Note  Patient: Megan Logan  Procedure(s) Performed: DECOMPRESSION, ENDOLYMPHATIC SAC (Right: Ear)  Patient Location: PACU  Anesthesia Type:General  Level of Consciousness: drowsy  Airway & Oxygen Therapy: Patient Spontanous Breathing and Patient connected to face mask oxygen  Post-op Assessment: Report given to RN and Post -op Vital signs reviewed and stable  Post vital signs: Reviewed and stable  Last Vitals:  Vitals Value Taken Time  BP 149/70 09/16/23 1014  Temp    Pulse 82 09/16/23 1014  Resp 19 09/16/23 1014  SpO2 96 % 09/16/23 1014    Last Pain:  Vitals:   09/16/23 0644  TempSrc: Tympanic  PainSc: 5       Patients Stated Pain Goal: 6 (09/16/23 0644)  Complications: No notable events documented.

## 2023-09-16 NOTE — Op Note (Signed)
 DATE OF PROCEDURE: 09/16/2023  SURGEON:  Reynold Caves, MD  OPERATIVE REPORT   PREOPERATIVE DIAGNOSIS:   1.  Right ear Mnire's disease 2.  Recurrent dizziness   POSTOPERATIVE DIAGNOSIS:   1.  Right ear Mnire's disease 2.  Recurrent dizziness   PROCEDURES PERFORMED: 1.  Right endolymphatic sac decompression (CPT [phone])   ANESTHESIA:  General endotracheal tube anesthesia.   COMPLICATIONS:  None.   ESTIMATED BLOOD LOSS:  50ml   INDICATION FOR PROCEDURE:    Megan Logan is a 83 y.o. female with a history of frequent recurrent dizziness, asymmetric right ear hearing loss, right ear tinnitus, and right aural pressure.  She was diagnosed with right ear Mnire's disease.  She was treated with low-salt diet and Dyazide diuretic.  The patient could not tolerate the use of diuretic due to her dehydration.  Over the past 6 months, she has been having frequent recurrent spinning vertigo.  She has had approximately 26 episodes of vertigo since February 2025.  She has not responded to the low-salt diet. Based on the above findings, the decision was made for patient to undergo the above-stated procedure. The risks, benefits, alternatives, and details of the procedures were discussed with the patient.  Questions were invited and answered.  Informed consent was obtained.   DESCRIPTION OF PROCEDURE:  The patient was taken to the operating room and placed supine on the operating table.  General endotracheal tube anesthesia was induced by the anesthesiologist.  The patient was positioned and prepped and draped in a standard fashion for right ear surgery. Facial nerve monitoring electrodes were placed. The facial nerve monitoring system was functional throughout the case.   1% lidocaine  with 1-100,000 epinephrine  was infiltrated into the right postauricular crease. A standard postauricular incision was made. The soft tissue covering the mastoid cortex was carefully elevated. Using a #6 cutting bur, a standard  cortical mastoidectomy procedure was performed. The tegmen and sigmoid sinus were identified and preserved. The posterior canal wall was taken down to the level of the facial nerve. The antrum was entered.  The bony covering over the sigmoid sinus and the posterior cranial fossa were then removed using a #3 diamond bur.  The endolymphatic sac was exposed and decompressed.  Hemostasis was achieved with bipolar electrocautery.   The postauricular incision was then closed in layers with 4-0 Vicryl and Dermabond. A Glasscock dressing was applied. That concluded the procedure for the patient.  The care of the patient was turned over to the anesthesiologist.  The patient was awakened from anesthesia without difficulty.  He was extubated and transferred to the recovery room in good condition.   OPERATIVE FINDINGS: The bony covering over the right sigmoid sinus and the right endolymphatic sac was removed.  The endolymphatic sac was decompressed.   SPECIMEN: None   FOLLOWUP CARE:  The patient will be discharged home once she is awake and alert.  She will follow up in my office in 1 week.

## 2023-09-17 ENCOUNTER — Encounter (HOSPITAL_BASED_OUTPATIENT_CLINIC_OR_DEPARTMENT_OTHER): Payer: Self-pay | Admitting: Otolaryngology

## 2023-09-23 ENCOUNTER — Ambulatory Visit (INDEPENDENT_AMBULATORY_CARE_PROVIDER_SITE_OTHER): Admitting: Otolaryngology

## 2023-09-23 ENCOUNTER — Encounter (INDEPENDENT_AMBULATORY_CARE_PROVIDER_SITE_OTHER): Payer: Self-pay | Admitting: Otolaryngology

## 2023-09-23 DIAGNOSIS — Z9889 Other specified postprocedural states: Secondary | ICD-10-CM

## 2023-09-23 DIAGNOSIS — H8101 Meniere's disease, right ear: Secondary | ICD-10-CM

## 2023-09-23 NOTE — Progress Notes (Signed)
 No issues postop. Right postauricular incision is healing well. No significant postop vertigo. Recheck in 4 to 5 months.

## 2023-10-01 ENCOUNTER — Telehealth: Payer: Self-pay

## 2023-10-01 DIAGNOSIS — Z79899 Other long term (current) drug therapy: Secondary | ICD-10-CM

## 2023-10-01 DIAGNOSIS — M810 Age-related osteoporosis without current pathological fracture: Secondary | ICD-10-CM

## 2023-10-01 NOTE — Telephone Encounter (Signed)
 Patient contacted the office to see when her next prolia  injection was due. Please advise.

## 2023-10-02 NOTE — Progress Notes (Deleted)
 Office Visit Note  Patient: Megan Logan             Date of Birth: 01/18/1941           MRN: [id number]             PCP: Audria Leather, MD Referring: Tura Gaines, MD Visit Date: 10/16/2023 Occupation: @GUAROCC @  Subjective:  No chief complaint on file.   History of Present Illness: Megan Logan is a 83 y.o. female ***     Activities of Daily Living:  Patient reports morning stiffness for *** {minute/hour:19697}.   Patient {ACTIONS;DENIES/REPORTS:21021675::"Denies"} nocturnal pain.  Difficulty dressing/grooming: {ACTIONS;DENIES/REPORTS:21021675::"Denies"} Difficulty climbing stairs: {ACTIONS;DENIES/REPORTS:21021675::"Denies"} Difficulty getting out of chair: {ACTIONS;DENIES/REPORTS:21021675::"Denies"} Difficulty using hands for taps, buttons, cutlery, and/or writing: {ACTIONS;DENIES/REPORTS:21021675::"Denies"}  No Rheumatology ROS completed.   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 04/03/2023   Right-sided tinnitus 04/03/2023   Meniere's disease of right ear 04/02/2023   Essential hypertension 02/29/2020   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   Volume depletion, gastrointestinal loss 12/04/2019   Hyponatremia 12/03/2019   Dizziness 12/03/2019   Ketonuria 12/03/2019   Metabolic acidosis 12/03/2019   Chronic rhinitis 02/19/2019   Primary osteoarthritis involving multiple joints 02/19/2019   DDD (degenerative disc disease), cervical 12/15/2018   Dyslipidemia 12/15/2018   History of asthma 12/15/2018   Elevated hemoglobin A1c 11/14/2017   Chronic neck pain 09/19/2016   Asthma 07/07/2015   Eczema 07/07/2015   Gastroesophageal reflux disease without esophagitis 07/07/2015   Restless legs syndrome 07/07/2015   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   Vitamin D  deficiency 08/06/2013   Age-related osteoporosis without current pathological fracture 08/04/2012   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   Osteoporosis 08/04/2012    Past Medical History:  Diagnosis Date   Adrenal insufficiency (HCC)    Anemia    diet related   Asthma    Atrophic vaginitis    Colitis    Fracture of L1 vertebra (HCC)    Hx of endometriosis    took Danacrine   Hypercholesterolemia    Hypertension    Osteoporosis    spine off Actonel  sinsce 09/2010- [facility]   PONV (postoperative nausea and vomiting)    Premature ovarian failure age 20   HRT age 71 - 12/2000    Family History  Problem Relation Age of Onset   Prostate cancer Father 35   Alzheimer's disease Mother 74   Other Sister        dizziness   Cancer Sister 47       Brain and Lung   Ulcerative colitis Sister    Migraines Sister    Cancer - Colon Maternal Grandfather 51   Irritable bowel syndrome Daughter    Colitis Daughter    Past Surgical History:  Procedure Laterality Date   CATARACT EXTRACTION W/PHACO Left 05/15/2021   Procedure: CATARACT EXTRACTION PHACO AND INTRAOCULAR LENS PLACEMENT (IOC);  Surgeon: Tarri Farm, MD;  Location: [facility];  Service: Ophthalmology;  Laterality: Left;  CDE: 18.54   CATARACT EXTRACTION W/PHACO Right 05/29/2021   Procedure: CATARACT EXTRACTION PHACO AND INTRAOCULAR LENS PLACEMENT (IOC);  Surgeon: Tarri Farm, MD;  Location: [facility];  Service: Ophthalmology;  Laterality: Right;  CDE: 14.51   ENDOLYMPHATIC SAC DECOMPRESSION Right 09/16/2023   Procedure: DECOMPRESSION, ENDOLYMPHATIC SAC;  Surgeon:  Reynold Caves, MD;  Location: [facility];  Service: ENT;  Laterality: Right;   HYSTEROSCOPY  1996   DUB--wnl   NASAL SINUS SURGERY  40's and 50's   TUBAL LIGATION Bilateral 1982   Social History   Social History Narrative   Not on file   Immunization History  Administered Date(s) Administered   Fluad Quad(high Dose 65+)  01/15/2019   Influenza Split 02/24/2017   Influenza, High Dose Seasonal PF 02/26/2014, 02/24/2017, 02/24/2018, 01/15/2019, 02/11/2020   Influenza,inj,Quad PF,6+ Mos 02/26/2014   Influenza,inj,quad, With Preservative 02/04/2017   Moderna Covid-19 Fall Seasonal Vaccine 4yrs & older 01/28/2022   Moderna Sars-Covid-2 Vaccination 05/19/2019, 06/17/2019, 02/29/2020, 08/10/2020   Pfizer Covid-19 Vaccine Bivalent Booster 62yrs & up 09/12/2021   Pneumococcal Conjugate-13 05/29/2015   Pneumococcal Polysaccharide-23 03/04/2013   Tdap 09/29/2015   Zoster Recombinant(Shingrix) 08/26/2017, 11/10/2017   Zoster, Live 05/07/2007, 08/26/2017, 11/10/2017     Objective: Vital Signs: LMP 05/07/1982 (Approximate)    Physical Exam   Musculoskeletal Exam: ***  CDAI Exam: CDAI Score: -- Patient Global: --; Provider Global: -- Swollen: --; Tender: -- Joint Exam 10/16/2023   No joint exam has been documented for this visit   There is currently no information documented on the homunculus. Go to the Rheumatology activity and complete the homunculus joint exam.  Investigation: No additional findings.  Imaging: No results found.  Recent Labs: Lab Results  Component Value Date   WBC 6.2 09/12/2023   HGB 12.3 09/12/2023   PLT 255 09/12/2023   NA 137 09/12/2023   K 4.6 09/12/2023   CL 101 09/12/2023   CO2 24 09/12/2023   GLUCOSE 94 09/12/2023   BUN 18 09/12/2023   CREATININE 0.78 09/12/2023   BILITOT 0.5 09/12/2023   ALKPHOS 59 09/12/2023   AST 21 09/12/2023   ALT 8 09/12/2023   PROT 5.7 (L) 09/12/2023   ALBUMIN 4.6 09/12/2023   CALCIUM  9.6 09/12/2023   GFRAA 87 07/14/2020    Speciality Comments: Prolia : 12/24/18, 06/26/19, 12/31/19, 07/14/20, 01/14/21  Procedures:  No procedures performed Allergies: Cephalexin, Erythromycin base, Hydrocodone bit-homatrop mbr, Levofloxacin, Penicillins, Sulfamethoxazole-trimethoprim, and Amoxicillin-pot clavulanate   Assessment / Plan:     Visit  Diagnoses: No diagnosis found.  Orders: No orders of the defined types were placed in this encounter.  No orders of the defined types were placed in this encounter.   Face-to-face time spent with patient was *** minutes. Greater than 50% of time was spent in counseling and coordination of care.  Follow-Up Instructions: No follow-ups on file.   Dee Farber, CMA  Note - This record has been created using Animal nutritionist.  Chart creation errors have been sought, but may not always  have been located. Such creation errors do not reflect on  the standard of medical care.

## 2023-10-07 ENCOUNTER — Other Ambulatory Visit (HOSPITAL_COMMUNITY): Payer: Self-pay

## 2023-10-07 ENCOUNTER — Other Ambulatory Visit: Payer: Self-pay

## 2023-10-07 ENCOUNTER — Other Ambulatory Visit: Payer: Self-pay | Admitting: Pharmacy Technician

## 2023-10-07 MED ORDER — DENOSUMAB 60 MG/ML ~~LOC~~ SOSY
60.0000 mg | PREFILLED_SYRINGE | Freq: Once | SUBCUTANEOUS | Status: AC
  Administered 2023-12-24: 60 mg via SUBCUTANEOUS

## 2023-10-07 MED ORDER — DENOSUMAB 60 MG/ML ~~LOC~~ SOSY
60.0000 mg | PREFILLED_SYRINGE | SUBCUTANEOUS | 0 refills | Status: AC
  Filled 2023-10-07: qty 1, 180d supply, fill #0

## 2023-10-07 NOTE — Addendum Note (Signed)
 Addended by: Thais Fill on: 10/07/2023 08:39 AM   Modules accepted: Orders

## 2023-10-07 NOTE — Telephone Encounter (Signed)
 Patient's next Prolia  is due. Labs from 09/25/23 stable (CareEverywhere) to proceed. Rx for Prolia  sent to [facility] to be couriered to clinic before appt on 10/16/23. Called patient to advise that we will administer at this visit  Geraldene Kleine, PharmD, MPH, BCPS, CPP Clinical Pharmacist (Rheumatology and Pulmonology)

## 2023-10-07 NOTE — Progress Notes (Signed)
 Specialty Pharmacy Refill Coordination Note  Megan Logan is a 83 y.o. female assessed today regarding refills of clinic administered specialty medication(s) Denosumab  (PROLIA )   Clinic requested Courier to Provider Office   Delivery date: 10/09/23   Verified address: rheum: [address]   Medication will be filled on 10/08/23.  Appt 10/16/23

## 2023-10-08 ENCOUNTER — Other Ambulatory Visit: Payer: Self-pay

## 2023-10-09 NOTE — Progress Notes (Deleted)
 Office Visit Note  Patient: Megan Logan             Date of Birth: [birth year]           MRN: [id number]             PCP: Audria Leather, MD Referring: Tura Gaines, MD Visit Date: 10/21/2023 Occupation: @GUAROCC @  Subjective:  No chief complaint on file.   History of Present Illness: Megan Logan is a 83 y.o. female ***   Previous DEXA 05/04/20: BMD as determined from [facility] L1-L4 (L2,L3) is 0.820 g/cm2 with a T-Score of -2.9-Osteoporosis.  Updated DEXA 08/17/2022 The BMD measured at Femur Neck Left is 0.704 g/cm2 with a T-score of-2.4. History of adrenal insufficiency requiring hydrocortisone  10 mg twice daily. History of GERD. Previous therapy includes Actonel  --discontinued due to severe GERD.   She has deferred dental implants due to wanting to initiate treatment. Prolia  was initiated on 12/24/2018--no side effects or interruptions in therapy thus far.  Of note the patient was found to have an L1 compression fracture on lumbar CT from 05/05/2022.   Patient's last Prolia  injection was administered on 09/26/2022. Forteo  daily injections were started on 11/21/22.  She has been tolerating Forteo  without any side effects or injection site reactions. She has also been taking a calcium  and vitamin D  supplement daily.  Plan on checking ionized calcium  and vitamin D  today.  Plan to also also set up for her to have her next Prolia  injection pending lab results. She has not had any recent falls. She will follow-up in the office in 6 months or sooner if needed  Activities of Daily Living:  Patient reports morning stiffness for *** {minute/hour:19697}.   Patient {ACTIONS;DENIES/REPORTS:21021675::"Denies"} nocturnal pain.  Difficulty dressing/grooming: {ACTIONS;DENIES/REPORTS:21021675::"Denies"} Difficulty climbing stairs: {ACTIONS;DENIES/REPORTS:21021675::"Denies"} Difficulty getting out of chair: {ACTIONS;DENIES/REPORTS:21021675::"Denies"} Difficulty using hands for taps,  buttons, cutlery, and/or writing: {ACTIONS;DENIES/REPORTS:21021675::"Denies"}  No Rheumatology ROS completed.   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 04/03/2023   Right-sided tinnitus 04/03/2023   Meniere's disease of right ear 04/02/2023   Essential hypertension 02/29/2020   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   Volume depletion, gastrointestinal loss 12/04/2019   Hyponatremia 12/03/2019   Dizziness 12/03/2019   Ketonuria 12/03/2019   Metabolic acidosis 12/03/2019   Chronic rhinitis 02/19/2019   Primary osteoarthritis involving multiple joints 02/19/2019   DDD (degenerative disc disease), cervical 12/15/2018   Dyslipidemia 12/15/2018   History of asthma 12/15/2018   Elevated hemoglobin A1c 11/14/2017   Chronic neck pain 09/19/2016   Asthma 07/07/2015   Eczema 07/07/2015   Gastroesophageal reflux disease without esophagitis 07/07/2015   Restless legs syndrome 07/07/2015   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   Vitamin D  deficiency 08/06/2013   Age-related osteoporosis without current pathological fracture 08/04/2012   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   Osteoporosis 08/04/2012    Past Medical History:  Diagnosis Date   Adrenal insufficiency (HCC)    Anemia    diet related   Asthma    Atrophic vaginitis    Colitis    Fracture of L1 vertebra (HCC)    Hx of endometriosis    took Danacrine   Hypercholesterolemia    Hypertension    Osteoporosis    spine off Actonel  sinsce 09/2010- Drug Holiday   PONV (postoperative nausea  and vomiting)    Premature ovarian failure age 79   HRT age 89 - 12/2000    Family History  Problem Relation Age of Onset   Prostate cancer Father 16   Alzheimer's disease Mother 6   Other Sister        dizziness    Cancer Sister 53       Brain and Lung   Ulcerative colitis Sister    Migraines Sister    Cancer - Colon Maternal Grandfather 43   Irritable bowel syndrome Daughter    Colitis Daughter    Past Surgical History:  Procedure Laterality Date   CATARACT EXTRACTION W/PHACO Left 05/15/2021   Procedure: CATARACT EXTRACTION PHACO AND INTRAOCULAR LENS PLACEMENT (IOC);  Surgeon: Tarri Farm, MD;  Location: [facility];  Service: Ophthalmology;  Laterality: Left;  CDE: 18.54   CATARACT EXTRACTION W/PHACO Right 05/29/2021   Procedure: CATARACT EXTRACTION PHACO AND INTRAOCULAR LENS PLACEMENT (IOC);  Surgeon: Tarri Farm, MD;  Location: [facility];  Service: Ophthalmology;  Laterality: Right;  CDE: 14.51   ENDOLYMPHATIC SAC DECOMPRESSION Right 09/16/2023   Procedure: DECOMPRESSION, ENDOLYMPHATIC SAC;  Surgeon: Reynold Caves, MD;  Location: [facility];  Service: ENT;  Laterality: Right;   HYSTEROSCOPY  1996   DUB--wnl   NASAL SINUS SURGERY  40's and 50's   TUBAL LIGATION Bilateral 1982   Social History   Social History Narrative   Not on file   Immunization History  Administered Date(s) Administered   Fluad Quad(high Dose 65+) 01/15/2019   Influenza Split 02/24/2017   Influenza, High Dose Seasonal PF 02/26/2014, 02/24/2017, 02/24/2018, 01/15/2019, 02/11/2020   Influenza,inj,Quad PF,6+ Mos 02/26/2014   Influenza,inj,quad, With Preservative 02/04/2017   Moderna Covid-19 Fall Seasonal Vaccine 79yrs & older 01/28/2022   Moderna Sars-Covid-2 Vaccination 05/19/2019, 06/17/2019, 02/29/2020, 08/10/2020   Pfizer Covid-19 Vaccine Bivalent Booster 7yrs & up 09/12/2021   Pneumococcal Conjugate-13 05/29/2015   Pneumococcal Polysaccharide-23 03/04/2013   Tdap 09/29/2015   Zoster Recombinant(Shingrix) 08/26/2017, 11/10/2017   Zoster, Live 05/07/2007, 08/26/2017, 11/10/2017     Objective: Vital Signs: LMP 05/07/1982 (Approximate)    Physical Exam   Musculoskeletal Exam: ***  CDAI Exam: CDAI  Score: -- Patient Global: --; Provider Global: -- Swollen: --; Tender: -- Joint Exam 10/21/2023   No joint exam has been documented for this visit   There is currently no information documented on the homunculus. Go to the Rheumatology activity and complete the homunculus joint exam.  Investigation: No additional findings.  Imaging: No results found.  Recent Labs: Lab Results  Component Value Date   WBC 6.2 09/12/2023   HGB 12.3 09/12/2023   PLT 255 09/12/2023   NA 137 09/12/2023   K 4.6 09/12/2023   CL 101 09/12/2023   CO2 24 09/12/2023   GLUCOSE 94 09/12/2023   BUN 18 09/12/2023   CREATININE 0.78 09/12/2023   BILITOT 0.5 09/12/2023   ALKPHOS 59 09/12/2023   AST 21 09/12/2023   ALT 8 09/12/2023   PROT 5.7 (L) 09/12/2023   ALBUMIN 4.6 09/12/2023   CALCIUM  9.6 09/12/2023   GFRAA 87 07/14/2020    Speciality Comments: Prolia : 12/24/18, 06/26/19, 12/31/19, 07/14/20, 01/14/21  Procedures:  No procedures performed Allergies: Cephalexin, Erythromycin base, Hydrocodone bit-homatrop mbr, Levofloxacin, Penicillins, Sulfamethoxazole-trimethoprim, and Amoxicillin-pot clavulanate   Assessment / Plan:     Visit Diagnoses: Age-related osteoporosis without current pathological fracture  Vitamin D  deficiency  Medication monitoring encounter  Primary osteoarthritis of both hands  Primary osteoarthritis of right knee  DDD (  degenerative disc disease), cervical  History of vertebral fracture  Adrenal insufficiency (HCC)  Dyslipidemia  History of gastroesophageal reflux (GERD)  History of asthma  Premature ovarian failure  Orders: No orders of the defined types were placed in this encounter.  No orders of the defined types were placed in this encounter.   Face-to-face time spent with patient was *** minutes. Greater than 50% of time was spent in counseling and coordination of care.  Follow-Up Instructions: No follow-ups on file.   Romayne Clubs, PA-C  Note -  This record has been created using Dragon software.  Chart creation errors have been sought, but may not always  have been located. Such creation errors do not reflect on  the standard of medical care.

## 2023-10-10 ENCOUNTER — Other Ambulatory Visit: Payer: Self-pay

## 2023-10-14 ENCOUNTER — Other Ambulatory Visit: Payer: Self-pay

## 2023-10-14 ENCOUNTER — Telehealth: Payer: Self-pay | Admitting: Rheumatology

## 2023-10-14 ENCOUNTER — Other Ambulatory Visit: Payer: Self-pay | Admitting: Rheumatology

## 2023-10-14 MED ORDER — TERIPARATIDE 560 MCG/2.24ML ~~LOC~~ SOPN
PEN_INJECTOR | SUBCUTANEOUS | 0 refills | Status: DC
  Filled 2023-10-14: qty 2.24, 28d supply, fill #0

## 2023-10-14 NOTE — Telephone Encounter (Signed)
 Last Fill: 09/11/2023  Labs: 09/25/2023 BUN 27 RBC 3.96  09/25/2023 Vitamin D  38.2  08/17/2022 Bone Density  Next Visit: 10/29/2023  Last Visit: 04/15/2023  DX:  Age-related osteoporosis without current pathological fracture   Current Dose per office note 04/15/2023: not mentioned  Okay to refill Forteo ?

## 2023-10-14 NOTE — Progress Notes (Signed)
 Specialty Pharmacy Refill Coordination Note  Megan Logan is a 83 y.o. female contacted today regarding refills of specialty medication(s) Teriparatide    Patient requested Delivery   Delivery date: 10/17/23   Verified address: Patient address [address]  [place] [address]   Medication will be filled on 06.11.25.   This fill date is pending response to refill request from provider. Patient is aware and if they have not received fill by intended date they must follow up with pharmacy.

## 2023-10-14 NOTE — Telephone Encounter (Signed)
 Pt called wanted to speak to the pharmacist about her prolia  shot.

## 2023-10-15 NOTE — Telephone Encounter (Signed)
 ATC patient regarding Prolia . Unable to reach. Left VM

## 2023-10-15 NOTE — Progress Notes (Deleted)
 Office Visit Note  Patient: Megan Logan             Date of Birth: 1941/03/15           MRN: [id number]             PCP: Audria Leather, MD Referring: Tura Gaines, MD Visit Date: 10/29/2023 Occupation: @GUAROCC @  Subjective:  No chief complaint on file.   History of Present Illness: Megan Logan is a 83 y.o. female ***   Previous DEXA 05/04/20: BMD as determined from [facility] L1-L4 (L2,L3) is 0.820 g/cm2 with a T-Score of -2.9-Osteoporosis.  Updated DEXA 08/17/2022 The BMD measured at Femur Neck Left is 0.704 g/cm2 with a T-score of-2.4. History of adrenal insufficiency requiring hydrocortisone  10 mg twice daily. History of GERD. Previous therapy includes Actonel  --discontinued due to severe GERD.   She has deferred dental implants due to wanting to initiate treatment. Prolia  was initiated on 12/24/2018--no side effects or interruptions in therapy thus far.  Of note the patient was found to have an L1 compression fracture on lumbar CT from 05/05/2022.   Patient's last Prolia  injection was administered on 09/26/2022. Forteo  daily injections were started on 11/21/22.  She has been tolerating Forteo  without any side effects or injection site reactions. She has also been taking a calcium  and vitamin D  supplement daily.  Plan on checking ionized calcium  and vitamin D  today.  Plan to also also set up for her to have her next Prolia  injection pending lab results. She has not had any recent falls. She will follow-up in the office in 6 months or sooner if needed  Activities of Daily Living:  Patient reports morning stiffness for *** {minute/hour:19697}.   Patient {ACTIONS;DENIES/REPORTS:21021675::"Denies"} nocturnal pain.  Difficulty dressing/grooming: {ACTIONS;DENIES/REPORTS:21021675::"Denies"} Difficulty climbing stairs: {ACTIONS;DENIES/REPORTS:21021675::"Denies"} Difficulty getting out of chair: {ACTIONS;DENIES/REPORTS:21021675::"Denies"} Difficulty using hands for taps,  buttons, cutlery, and/or writing: {ACTIONS;DENIES/REPORTS:21021675::"Denies"}  No Rheumatology ROS completed.   PMFS History:  Patient Active Problem List   Diagnosis Date Noted  . Active cochleovestibular Meniere's disease of right ear 09/16/2023  . 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 Right-sided tinnitus 04/03/2023  . 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 Essential hypertension 02/29/2020  . 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 Volume depletion, gastrointestinal loss 12/04/2019  . Hyponatremia 12/03/2019  . Dizziness 12/03/2019  . Ketonuria 12/03/2019  . Metabolic acidosis 12/03/2019  . Chronic rhinitis 02/19/2019  . Primary osteoarthritis involving multiple joints 02/19/2019  . DDD (degenerative disc disease), cervical 12/15/2018  . Dyslipidemia 12/15/2018  . History of asthma 12/15/2018  . Elevated hemoglobin A1c 11/14/2017  . Chronic neck pain 09/19/2016  . Asthma 07/07/2015  . Eczema 07/07/2015  . Gastroesophageal reflux disease without esophagitis 07/07/2015  . Restless legs syndrome 07/07/2015  . 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 Vitamin D  deficiency 08/06/2013  . Age-related osteoporosis without current pathological fracture 08/04/2012  . 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  . Osteoporosis 08/04/2012    Past Medical History:  Diagnosis Date  . Adrenal insufficiency (HCC)   . Anemia    diet related  . Asthma   . Atrophic vaginitis   . Colitis   . Fracture of L1 vertebra (HCC)   . Hx of endometriosis    took Danacrine  . Hypercholesterolemia   . Hypertension   . Osteoporosis    spine off Actonel  sinsce 09/2010- Drug Holiday  . PONV (postoperative nausea  and vomiting)   . Premature ovarian failure age 3   HRT age 75 - 12/2000    Family History  Problem Relation Age of Onset  . Prostate cancer Father 32  . Alzheimer's disease  Mother 78  . Other Sister        dizziness  . Cancer Sister 8       Brain and Lung  . Ulcerative colitis Sister   . Migraines Sister   . Cancer - Colon Maternal Grandfather 51  . Irritable bowel syndrome Daughter   . Colitis Daughter    Past Surgical History:  Procedure Laterality Date  . CATARACT EXTRACTION W/PHACO Left 05/15/2021   Procedure: CATARACT EXTRACTION PHACO AND INTRAOCULAR LENS PLACEMENT (IOC);  Surgeon: Tarri Farm, MD;  Location: [facility];  Service: Ophthalmology;  Laterality: Left;  CDE: 18.54  . CATARACT EXTRACTION W/PHACO Right 05/29/2021   Procedure: CATARACT EXTRACTION PHACO AND INTRAOCULAR LENS PLACEMENT (IOC);  Surgeon: Tarri Farm, MD;  Location: [facility];  Service: Ophthalmology;  Laterality: Right;  CDE: 14.51  . ENDOLYMPHATIC [facility] DECOMPRESSION Right 09/16/2023   Procedure: DECOMPRESSION, ENDOLYMPHATIC SAC;  Surgeon: Reynold Caves, MD;  Location: [facility];  Service: ENT;  Laterality: Right;  . HYSTEROSCOPY  1996   DUB--wnl  . NASAL SINUS SURGERY  40's and 50's  . TUBAL LIGATION Bilateral 1982   Social History   Social History Narrative  . Not on file   Immunization History  Administered Date(s) Administered  . Fluad Quad(high Dose 65+) 01/15/2019  . Influenza Split 02/24/2017  . Influenza, High Dose Seasonal PF 02/26/2014, 02/24/2017, 02/24/2018, 01/15/2019, 02/11/2020  . Influenza,inj,Quad PF,6+ Mos 02/26/2014  . Influenza,inj,quad, With Preservative 02/04/2017  . Moderna Covid-19 Fall Seasonal Vaccine 64yrs & older 01/28/2022  . Moderna Sars-Covid-2 Vaccination 05/19/2019, 06/17/2019, 02/29/2020, 08/10/2020  . Pfizer Covid-19 Vaccine Bivalent Booster 27yrs & up 09/12/2021  . Pneumococcal Conjugate-13 05/29/2015  . Pneumococcal Polysaccharide-23 03/04/2013  . Tdap 09/29/2015  . Zoster Recombinant(Shingrix) 08/26/2017, 11/10/2017  . Zoster, Live 05/07/2007, 08/26/2017, 11/10/2017     Objective: Vital Signs: LMP 05/07/1982  (Approximate)    Physical Exam   Musculoskeletal Exam: ***  CDAI Exam: CDAI Score: -- Patient Global: --; Provider Global: -- Swollen: --; Tender: -- Joint Exam 10/29/2023   No joint exam has been documented for this visit   There is currently no information documented on the homunculus. Go to the Rheumatology activity and complete the homunculus joint exam.  Investigation: No additional findings.  Imaging: No results found.  Recent Labs: Lab Results  Component Value Date   WBC 6.2 09/12/2023   HGB 12.3 09/12/2023   PLT 255 09/12/2023   NA 137 09/12/2023   K 4.6 09/12/2023   CL 101 09/12/2023   CO2 24 09/12/2023   GLUCOSE 94 09/12/2023   BUN 18 09/12/2023   CREATININE 0.78 09/12/2023   BILITOT 0.5 09/12/2023   ALKPHOS 59 09/12/2023   AST 21 09/12/2023   ALT 8 09/12/2023   PROT 5.7 (L) 09/12/2023   ALBUMIN 4.6 09/12/2023   CALCIUM  9.6 09/12/2023   GFRAA 87 07/14/2020    Speciality Comments: Prolia : 12/24/18, 06/26/19, 12/31/19, 07/14/20, 01/14/21  Procedures:  No procedures performed Allergies: Cephalexin, Erythromycin base, Hydrocodone bit-homatrop mbr, Levofloxacin, Penicillins, Sulfamethoxazole-trimethoprim, and Amoxicillin-pot clavulanate   Assessment / Plan:     Visit Diagnoses: Age-related osteoporosis without current pathological fracture  Medication monitoring encounter  Vitamin D  deficiency  Primary osteoarthritis of both hands  Primary osteoarthritis of right knee  DDD (  degenerative disc disease), cervical  History of vertebral fracture  Adrenal insufficiency (HCC)  Dyslipidemia  History of gastroesophageal reflux (GERD)  History of asthma  Premature ovarian failure  Orders: No orders of the defined types were placed in this encounter.  No orders of the defined types were placed in this encounter.   Face-to-face time spent with patient was *** minutes. Greater than 50% of time was spent in counseling and coordination of  care.  Follow-Up Instructions: No follow-ups on file.   Romayne Clubs, PA-C  Note - This record has been created using Dragon software.  Chart creation errors have been sought, but may not always  have been located. Such creation errors do not reflect on  the standard of medical care.

## 2023-10-16 ENCOUNTER — Ambulatory Visit: Payer: Medicare PPO | Admitting: Rheumatology

## 2023-10-16 DIAGNOSIS — E559 Vitamin D deficiency, unspecified: Secondary | ICD-10-CM

## 2023-10-16 DIAGNOSIS — E2839 Other primary ovarian failure: Secondary | ICD-10-CM

## 2023-10-16 DIAGNOSIS — E2740 Unspecified adrenocortical insufficiency: Secondary | ICD-10-CM

## 2023-10-16 DIAGNOSIS — M19041 Primary osteoarthritis, right hand: Secondary | ICD-10-CM

## 2023-10-16 DIAGNOSIS — M1711 Unilateral primary osteoarthritis, right knee: Secondary | ICD-10-CM

## 2023-10-16 DIAGNOSIS — Z8719 Personal history of other diseases of the digestive system: Secondary | ICD-10-CM

## 2023-10-16 DIAGNOSIS — Z5181 Encounter for therapeutic drug level monitoring: Secondary | ICD-10-CM

## 2023-10-16 DIAGNOSIS — E785 Hyperlipidemia, unspecified: Secondary | ICD-10-CM

## 2023-10-16 DIAGNOSIS — M810 Age-related osteoporosis without current pathological fracture: Secondary | ICD-10-CM

## 2023-10-16 DIAGNOSIS — M5030 Other cervical disc degeneration, unspecified cervical region: Secondary | ICD-10-CM

## 2023-10-16 DIAGNOSIS — Z8781 Personal history of (healed) traumatic fracture: Secondary | ICD-10-CM

## 2023-10-16 DIAGNOSIS — Z8709 Personal history of other diseases of the respiratory system: Secondary | ICD-10-CM

## 2023-10-16 NOTE — Telephone Encounter (Signed)
 Patient returned call - will plan to administer Prolia  at r/s appt on 10/29/23

## 2023-10-18 HISTORY — PX: GUM SURGERY: SHX658

## 2023-10-21 ENCOUNTER — Ambulatory Visit: Admitting: Physician Assistant

## 2023-10-21 DIAGNOSIS — E2839 Other primary ovarian failure: Secondary | ICD-10-CM

## 2023-10-21 DIAGNOSIS — M5030 Other cervical disc degeneration, unspecified cervical region: Secondary | ICD-10-CM

## 2023-10-21 DIAGNOSIS — Z8719 Personal history of other diseases of the digestive system: Secondary | ICD-10-CM

## 2023-10-21 DIAGNOSIS — E785 Hyperlipidemia, unspecified: Secondary | ICD-10-CM

## 2023-10-21 DIAGNOSIS — Z8709 Personal history of other diseases of the respiratory system: Secondary | ICD-10-CM

## 2023-10-21 DIAGNOSIS — Z8781 Personal history of (healed) traumatic fracture: Secondary | ICD-10-CM

## 2023-10-21 DIAGNOSIS — Z5181 Encounter for therapeutic drug level monitoring: Secondary | ICD-10-CM

## 2023-10-21 DIAGNOSIS — E2740 Unspecified adrenocortical insufficiency: Secondary | ICD-10-CM

## 2023-10-21 DIAGNOSIS — M19041 Primary osteoarthritis, right hand: Secondary | ICD-10-CM

## 2023-10-21 DIAGNOSIS — E559 Vitamin D deficiency, unspecified: Secondary | ICD-10-CM

## 2023-10-21 DIAGNOSIS — M1711 Unilateral primary osteoarthritis, right knee: Secondary | ICD-10-CM

## 2023-10-21 DIAGNOSIS — M810 Age-related osteoporosis without current pathological fracture: Secondary | ICD-10-CM

## 2023-10-22 ENCOUNTER — Telehealth: Payer: Self-pay | Admitting: Rheumatology

## 2023-10-22 NOTE — Telephone Encounter (Signed)
 We may delay Prolia  by 3 months.  She should continue Forteo  as prescribed.

## 2023-10-22 NOTE — Telephone Encounter (Signed)
 Patient advised we may delay Prolia  by 3 months.  She should continue Forteo  as prescribed. Patient expressed understanding.

## 2023-10-22 NOTE — Telephone Encounter (Signed)
 Patient is currently prescribed prolia  and forteo  as combination therapy.

## 2023-10-22 NOTE — Telephone Encounter (Signed)
 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  Patient states she had a biopsy and was told it will take 2 weeks to receive the results.  Patient cancelled her 10/29/23 appointment because she needs to wait for the results before she has another Prolia  injection.

## 2023-10-29 ENCOUNTER — Ambulatory Visit: Admitting: Physician Assistant

## 2023-10-29 DIAGNOSIS — Z5181 Encounter for therapeutic drug level monitoring: Secondary | ICD-10-CM

## 2023-10-29 DIAGNOSIS — E2839 Other primary ovarian failure: Secondary | ICD-10-CM

## 2023-10-29 DIAGNOSIS — E2740 Unspecified adrenocortical insufficiency: Secondary | ICD-10-CM

## 2023-10-29 DIAGNOSIS — M19041 Primary osteoarthritis, right hand: Secondary | ICD-10-CM

## 2023-10-29 DIAGNOSIS — Z8719 Personal history of other diseases of the digestive system: Secondary | ICD-10-CM

## 2023-10-29 DIAGNOSIS — M5030 Other cervical disc degeneration, unspecified cervical region: Secondary | ICD-10-CM

## 2023-10-29 DIAGNOSIS — M810 Age-related osteoporosis without current pathological fracture: Secondary | ICD-10-CM

## 2023-10-29 DIAGNOSIS — Z8781 Personal history of (healed) traumatic fracture: Secondary | ICD-10-CM

## 2023-10-29 DIAGNOSIS — M1711 Unilateral primary osteoarthritis, right knee: Secondary | ICD-10-CM

## 2023-10-29 DIAGNOSIS — E559 Vitamin D deficiency, unspecified: Secondary | ICD-10-CM

## 2023-10-29 DIAGNOSIS — Z8709 Personal history of other diseases of the respiratory system: Secondary | ICD-10-CM

## 2023-10-29 DIAGNOSIS — E785 Hyperlipidemia, unspecified: Secondary | ICD-10-CM

## 2023-11-05 ENCOUNTER — Other Ambulatory Visit: Payer: Self-pay

## 2023-11-06 ENCOUNTER — Other Ambulatory Visit: Payer: Self-pay | Admitting: Physician Assistant

## 2023-11-06 ENCOUNTER — Other Ambulatory Visit: Payer: Self-pay

## 2023-11-06 NOTE — Telephone Encounter (Deleted)
 SABRA

## 2023-11-07 ENCOUNTER — Other Ambulatory Visit: Payer: Self-pay | Admitting: Physician Assistant

## 2023-11-07 ENCOUNTER — Other Ambulatory Visit: Payer: Self-pay

## 2023-11-11 ENCOUNTER — Other Ambulatory Visit: Payer: Self-pay

## 2023-11-11 ENCOUNTER — Other Ambulatory Visit (HOSPITAL_COMMUNITY): Payer: Self-pay

## 2023-11-11 MED ORDER — TERIPARATIDE 560 MCG/2.24ML ~~LOC~~ SOPN
20.0000 ug | PEN_INJECTOR | Freq: Every day | SUBCUTANEOUS | 0 refills | Status: DC
  Filled 2023-11-11 – 2023-11-13 (×2): qty 2.24, 28d supply, fill #0

## 2023-11-11 NOTE — Telephone Encounter (Signed)
 Attempted to contact patient and left message to advise patient to call the office and schedule follow up appointment.

## 2023-11-11 NOTE — Addendum Note (Signed)
 Addended by: Foch Rosenwald M on: 11/11/2023 01:10 PM   Modules accepted: Orders

## 2023-11-11 NOTE — Progress Notes (Signed)
 Patient informed of refill request denial and advised to call the office today.

## 2023-11-11 NOTE — Telephone Encounter (Addendum)
 Last Fill: 09/11/2023   Labs: 09/25/2023 BUN 27 RBC 3.96   09/25/2023 Vitamin D  38.2   08/17/2022 Bone Density   Next Visit: Due 10/14/2023. Message sent to the front to schedule.   Last Visit: 04/15/2023   DX:  Age-related osteoporosis without current pathological fracture    Current Dose per office note 04/15/2023: not mentioned   Okay to refill Forteo ?

## 2023-11-11 NOTE — Telephone Encounter (Signed)
 Please schedule patient a follow up visit. Patient due around 10/14/2023 . Thanks!

## 2023-11-13 ENCOUNTER — Other Ambulatory Visit (HOSPITAL_COMMUNITY): Payer: Self-pay

## 2023-11-13 ENCOUNTER — Other Ambulatory Visit: Payer: Self-pay

## 2023-11-13 ENCOUNTER — Other Ambulatory Visit: Payer: Self-pay | Admitting: Pharmacy Technician

## 2023-11-13 NOTE — Progress Notes (Signed)
 Specialty Pharmacy Refill Coordination Note  Megan Logan is a 83 y.o. female contacted today regarding refills of specialty medication(s) Teriparatide    Patient requested Delivery   Delivery date: 11/15/23   Verified address: [address] [place] [address]   Medication will be filled on 11/13/23.

## 2023-11-26 ENCOUNTER — Other Ambulatory Visit (HOSPITAL_BASED_OUTPATIENT_CLINIC_OR_DEPARTMENT_OTHER): Payer: Self-pay

## 2023-11-26 ENCOUNTER — Other Ambulatory Visit: Payer: Self-pay | Admitting: Physician Assistant

## 2023-11-26 MED ORDER — INSUPEN PEN NEEDLES 31G X 5 MM MISC
0 refills | Status: DC
  Filled 2023-11-26: qty 100, 100d supply, fill #0

## 2023-11-26 NOTE — Telephone Encounter (Signed)
 Last Fill: 08/13/2023   Next Visit: 10/16/2023   Last Visit: 04/15/2023   Dx: Age-related osteoporosis without current pathological fracture    Current Dose per office note on 04/15/2023: She will remain on Forteo  daily injections.    Okay to refill Pen needles?

## 2023-11-27 ENCOUNTER — Other Ambulatory Visit: Payer: Self-pay

## 2023-11-28 NOTE — Telephone Encounter (Signed)
 Based on recommendation from Dr. Dolphus from 10/22/2023 -  Patient advised we may delay Prolia  by 3 months.  She should continue Forteo  as prescribed. Patient expressed understanding.

## 2023-12-04 ENCOUNTER — Ambulatory Visit (HOSPITAL_COMMUNITY)
Admission: RE | Admit: 2023-12-04 | Discharge: 2023-12-04 | Disposition: A | Discharge status: Home / Self Care | Source: Ambulatory Visit | Attending: Family Medicine | Admitting: Family Medicine

## 2023-12-04 DIAGNOSIS — Z1231 Encounter for screening mammogram for malignant neoplasm of breast: Secondary | ICD-10-CM | POA: Insufficient documentation

## 2023-12-09 ENCOUNTER — Other Ambulatory Visit: Payer: Self-pay

## 2023-12-09 ENCOUNTER — Other Ambulatory Visit (HOSPITAL_COMMUNITY): Payer: Self-pay

## 2023-12-09 ENCOUNTER — Other Ambulatory Visit: Payer: Self-pay | Admitting: Physician Assistant

## 2023-12-09 MED ORDER — TERIPARATIDE 560 MCG/2.24ML ~~LOC~~ SOPN
20.0000 ug | PEN_INJECTOR | Freq: Every day | SUBCUTANEOUS | 0 refills | Status: DC
Start: 2023-12-09 — End: 2024-02-27
  Filled 2023-12-09: qty 2.24, 28d supply, fill #0
  Filled 2024-01-03: qty 2.24, 28d supply, fill #1
  Filled 2024-01-29: qty 2.24, 28d supply, fill #2

## 2023-12-09 NOTE — Progress Notes (Signed)
 Specialty Pharmacy Refill Coordination Note  Megan Logan is a 83 y.o. female contacted today regarding refills of specialty medication(s) Teriparatide    Patient requested Delivery   Delivery date: 12/12/23   Verified address: [address] [place] [address]   Medication will be filled on 12/11/23.This fill date is pending response to refill request from provider. Patient is aware and if they have not received fill by intended date they must follow up with pharmacy.

## 2023-12-09 NOTE — Telephone Encounter (Signed)
 Last Fill: 11/11/2023  Labs: 09/25/2023 BUN 27 Magnesium 1.8 Hemoglobin A1c 5.8 Vitamin D  25-Hydroxy WNL RBC 3.96  Next Visit: 12/17/2023  Last Visit: 04/14/2024  DX: osteoporosis and osteoarthritis.   Current Dose per office note 04/14/2024: Forteo  daily injections.  Okay to refill Forteo ?

## 2023-12-10 ENCOUNTER — Other Ambulatory Visit: Payer: Self-pay

## 2023-12-10 NOTE — Progress Notes (Unsigned)
 Office Visit Note  Patient: Megan Logan             Date of Birth: 1940/09/06           MRN: [id number]             PCP: Lazoff, Shawn P, DO Referring: Tanda Prentice DEL, MD Visit Date: 12/17/2023 Occupation: @GUAROCC @  Subjective:  Discuss medications   History of Present Illness: Megan Logan is a 83 y.o. female with history of osteoporosis.  Patient remains on vitamin D  2000 units daily and calcium  carbonate 600 mg twice daily.  Patient remains on Forteo  daily injections for management of osteoporosis.  She is overdue for her Prolia  injection but has been hesitant to proceed due to new diagnosis of osteonecrosis of the jaw.  According to the patient her oral surgeon recommended proceeding with the next Prolia  dose as prescribed.  Patient had reached out to an endocrinologist for their opinion as well but had not heard back.  She plans on rechecking with her endocrinologist to see the recommendations of proceeding with the next Prolia  dose. She has no other upcoming dental work scheduled.  Patient states she continues to have chronic pain in her lower back.  She is under the care of Dr. Bonner for pain management.  She is currently using buprenorphine patches weekly.  Patient states that she has been experiencing increased constipation which she attributes to her narcotic use.  Patient states that she was initiated on linzess but has found it be ineffective.  Patient states that she has also been experiencing some appetite change and nausea which she attributes to constipation.  She has had a few episodes of significant fatigue, sweating, and nausea to the point that she had to lay down to sleep right away for relief.   Activities of Daily Living:  Patient reports morning stiffness for 1 hour.   Patient Denies nocturnal pain.  Difficulty dressing/grooming: Denies Difficulty climbing stairs: Reports Difficulty getting out of chair: Denies Difficulty using hands for taps, buttons,  cutlery, and/or writing: Denies  Review of Systems  Constitutional:  Positive for fatigue.  HENT:  Positive for mouth dryness. Negative for mouth sores.   Eyes:  Negative for dryness.  Respiratory:  Positive for shortness of breath.   Cardiovascular:  Negative for chest pain and palpitations.  Gastrointestinal:  Positive for constipation. Negative for blood in stool and diarrhea.  Endocrine: Negative for increased urination.  Genitourinary:  Negative for involuntary urination.  Musculoskeletal:  Positive for joint pain, gait problem, joint pain, joint swelling, myalgias, morning stiffness and myalgias. Negative for muscle weakness and muscle tenderness.  Skin:  Positive for hair loss. Negative for color change, rash and sensitivity to sunlight.  Allergic/Immunologic: Negative for susceptible to infections.  Neurological:  Negative for dizziness and headaches.  Hematological:  Negative for swollen glands.  Psychiatric/Behavioral:  Negative for depressed mood and sleep disturbance. The patient is not nervous/anxious.     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 04/03/2023   Right-sided tinnitus 04/03/2023   Meniere's disease of right ear 04/02/2023   Essential hypertension 02/29/2020   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   Volume depletion, gastrointestinal loss 12/04/2019   Hyponatremia 12/03/2019   Dizziness 12/03/2019   Ketonuria 12/03/2019   Metabolic acidosis 12/03/2019  Chronic rhinitis 02/19/2019   Primary osteoarthritis involving multiple joints 02/19/2019   DDD (degenerative disc disease), cervical 12/15/2018   Dyslipidemia 12/15/2018   History of asthma 12/15/2018   Elevated hemoglobin A1c 11/14/2017   Chronic neck pain 09/19/2016   Asthma 07/07/2015    Eczema 07/07/2015   Gastroesophageal reflux disease without esophagitis 07/07/2015   Restless legs syndrome 07/07/2015   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   Vitamin D  deficiency 08/06/2013   Age-related osteoporosis without current pathological fracture 08/04/2012   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   Osteoporosis 08/04/2012    Past Medical History:  Diagnosis Date   Adrenal insufficiency (HCC)    Anemia    diet related   Asthma    Atrophic vaginitis    Colitis    Fracture of L1 vertebra (HCC)    Hx of endometriosis    took Danacrine   Hypercholesterolemia    Hypertension    Osteoporosis    spine off Actonel  sinsce 09/2010- [facility]   PONV (postoperative nausea and vomiting)    Premature ovarian failure age 55   HRT age 24 - 12/2000    Family History  Problem Relation Age of Onset   Prostate cancer Father 53   Alzheimer's disease Mother 59   Other Sister        dizziness   Cancer Sister 87       Brain and Lung   Ulcerative colitis Sister    Migraines Sister    Cancer - Colon Maternal Grandfather 51   Irritable bowel syndrome Daughter    Colitis Daughter    Past Surgical History:  Procedure Laterality Date   CATARACT EXTRACTION W/PHACO Left 05/15/2021   Procedure: CATARACT EXTRACTION PHACO AND INTRAOCULAR LENS PLACEMENT (IOC);  Surgeon: Harrie Agent, MD;  Location: [facility];  Service: Ophthalmology;  Laterality: Left;  CDE: 18.54   CATARACT EXTRACTION W/PHACO Right 05/29/2021   Procedure: CATARACT EXTRACTION PHACO AND INTRAOCULAR LENS PLACEMENT (IOC);  Surgeon: Harrie Agent, MD;  Location: [facility];  Service: Ophthalmology;  Laterality: Right;  CDE: 14.51   ENDOLYMPHATIC SAC DECOMPRESSION Right 09/16/2023   Procedure: DECOMPRESSION, ENDOLYMPHATIC SAC;  Surgeon: Karis Clunes, MD;  Location: [facility];  Service: ENT;  Laterality: Right;   GUM SURGERY  10/18/2023   HYSTEROSCOPY  05/07/1994   DUB--wnl   NASAL SINUS SURGERY  40's and 50's    TUBAL LIGATION Bilateral 05/07/1980   Social History   Social History Narrative   Not on file   Immunization History  Administered Date(s) Administered   Fluad Quad(high Dose 65+) 01/15/2019   Influenza Split 02/24/2017   Influenza, High Dose Seasonal PF 02/26/2014, 02/24/2017, 02/24/2018, 01/15/2019, 02/11/2020   Influenza,inj,Quad PF,6+ Mos 02/26/2014   Influenza,inj,quad, With Preservative 02/04/2017   Moderna Covid-19 Fall Seasonal Vaccine 42yrs & older 01/28/2022   Moderna Sars-Covid-2 Vaccination 05/19/2019, 06/17/2019, 02/29/2020, 08/10/2020   Pfizer Covid-19 Vaccine Bivalent Booster 71yrs & up 09/12/2021   Pneumococcal Conjugate-13 05/29/2015   Pneumococcal Polysaccharide-23 03/04/2013   Tdap 09/29/2015   Zoster Recombinant(Shingrix) 08/26/2017, 11/10/2017   Zoster, Live 05/07/2007, 08/26/2017, 11/10/2017     Objective: Vital Signs: BP 128/78 (BP Location: Left Arm, Patient Position: Sitting, Cuff Size: Normal)   Pulse 71   Resp 12   Ht 5' (1.524 m)   Wt 93 lb (42.2 kg)   LMP 05/07/1982 (Approximate)   BMI 18.16 kg/m    Physical Exam Vitals and nursing note reviewed.  Constitutional:      Appearance: She is well-developed.  HENT:     Head: Normocephalic and atraumatic.  Eyes:     Conjunctiva/sclera: Conjunctivae normal.  Cardiovascular:     Rate and Rhythm: Normal rate and regular rhythm.     Heart sounds: Normal heart sounds.  Pulmonary:     Effort: Pulmonary effort is normal.     Breath sounds: Normal breath sounds.  Abdominal:     General: Bowel sounds are normal.     Palpations: Abdomen is soft.  Musculoskeletal:     Cervical back: Normal range of motion.  Lymphadenopathy:     Cervical: No cervical adenopathy.  Skin:    General: Skin is warm and dry.     Capillary Refill: Capillary refill takes less than 2 seconds.  Neurological:     Mental Status: She is alert and oriented to person, place, and time.  Psychiatric:        Behavior: Behavior  normal.      Musculoskeletal Exam: C-spine has limited range of motion without rotation.  Painful limited mobility of the lumbar spine.  Shoulder joints, elbow joints, wrist joints, MCPs, PIPs, DIPs have good range of motion with no synovitis.  PIP and DIP thickening consistent with osteoarthritis of both hands.  CMC joint prominence and thickening noted bilaterally.  Subluxation of both CMC joints.  Hip joints have good range of motion with no groin pain.  Knee joints have good range of motion with no warmth or effusion.  Ankle joints have good range of motion with no tenderness or joint swelling.  CDAI Exam: CDAI Score: -- Patient Global: --; Provider Global: -- Swollen: --; Tender: -- Joint Exam 12/17/2023   No joint exam has been documented for this visit   There is currently no information documented on the homunculus. Go to the Rheumatology activity and complete the homunculus joint exam.  Investigation: No additional findings.  Imaging: MM 3D SCREENING MAMMOGRAM BILATERAL BREAST Result Date: 12/06/2023 CLINICAL DATA:  Screening. EXAM: DIGITAL SCREENING BILATERAL MAMMOGRAM WITH TOMOSYNTHESIS AND CAD TECHNIQUE: Bilateral screening digital craniocaudal and mediolateral oblique mammograms were obtained. Bilateral screening digital breast tomosynthesis was performed. The images were evaluated with computer-aided detection. COMPARISON:  Previous exam(s). ACR Breast Density Category c: The breasts are heterogeneously dense, which may obscure small masses. FINDINGS: There are no findings suspicious for malignancy. IMPRESSION: No mammographic evidence of malignancy. A result letter of this screening mammogram will be mailed directly to the patient. RECOMMENDATION: Screening mammogram in one year. (Code:SM-B-01Y) BI-RADS CATEGORY  1: Negative. Electronically Signed   By: Toribio Agreste M.D.   On: 12/06/2023 11:50    Recent Labs: Lab Results  Component Value Date   WBC 6.2 09/12/2023   HGB 12.3  09/12/2023   PLT 255 09/12/2023   NA 137 09/12/2023   K 4.6 09/12/2023   CL 101 09/12/2023   CO2 24 09/12/2023   GLUCOSE 94 09/12/2023   BUN 18 09/12/2023   CREATININE 0.78 09/12/2023   BILITOT 0.5 09/12/2023   ALKPHOS 59 09/12/2023   AST 21 09/12/2023   ALT 8 09/12/2023   PROT 5.7 (L) 09/12/2023   ALBUMIN 4.6 09/12/2023   CALCIUM  9.6 09/12/2023   GFRAA 87 07/14/2020    Speciality Comments: Prolia : 12/24/18, 06/26/19, 12/31/19, 07/14/20, 01/14/21  Procedures:  No procedures performed Allergies: Cephalexin, Erythromycin base, Hydrocodone bit-homatrop mbr, Levofloxacin, Penicillins, Sulfamethoxazole-trimethoprim, and Amoxicillin-pot clavulanate     Assessment / Plan:     Visit Diagnoses: Age-related osteoporosis without current pathological fracture Previous DEXA 05/04/20: BMD as determined from  [facility] L1-L4 (L2,L3) is 0.820 g/cm2 with a T-Score of -2.9-Osteoporosis.  Updated DEXA 08/17/2022 The BMD measured at Femur Neck Left is 0.704 g/cm2 with a T-score of-2.4. History of adrenal insufficiency requiring hydrocortisone  10 mg twice daily. History of GERD. Previous therapy includes Actonel  --discontinued due to severe GERD.   She is taking vitamin D  2000 units daily and calcium  carbonate 600 mg twice daily. Prolia  was initiated on 12/24/2018. Of note the patient was found to have an L1 compression fracture on lumbar CT from 05/05/2022.   Forteo  daily injections added as combination therapy on 11/21/22.  She has been tolerating Forteo  without any side effects or injection site reactions. Patient's last Prolia  injection was administered on 04/29/2023.  She is overdue for Prolia  but was recently diagnosed with osteonecrosis of the jaw.  According to the patient her oral surgeon felt comfortable with her proceeding with her next Prolia  injection.  We were also in agreement that she should resume Prolia  injections every 6 months-discussed with Dr. Dolphus.  The patient would like to reach  out to her endocrinologist to also get their opinion about her resuming Prolia .  She will notify us  when if she would like to proceed with the next really injection.  We have her dose of Prolia  in the office.  Plan to update CMP and vitamin D  today.   Next DEXA will be due in April 2026. She will follow-up in the office in 6 months or sooner if needed.  Vitamin D  deficiency -Vitamin D  obtained today for further evaluation.  Plan: VITAMIN D  25 Hydroxy (Vit-D Deficiency, Fractures)  Medication monitoring encounter -Recommend continuing Prolia  60 mg subcu days injections every 6 months and Forteo  daily injections.  She will complete Forteo  in July 2026.  CMP with GFR and vitamin D  updated today.  Plan: Comprehensive metabolic panel with GFR, VITAMIN D  25 Hydroxy (Vit-D Deficiency, Fractures)  Primary osteoarthritis of both hands: CMC, PIP, DIP thickening consistent with osteoarthritis of both hands.  Subluxation of both CMC joints.  No synovitis noted.  Primary osteoarthritis of right knee: Good range of motion of the right knee joint.  No warmth or effusion noted.  DDD (degenerative disc disease), cervical: C-spine is admitted range of motion without rotation.  History of vertebral fracture: CT of the lumbar spine 05/05/2022: Acute or subacute L1 inferior endplate compression fracture with unhealed fracture lucency, 25% loss of vertebral body height.  Chronic pain.   Under care of Dr. Derry buprenorphine patches weekly.    Adrenal insufficiency [facility]): Under the care of endocrinology.  She takes hydrocortisone  10 mg 2 times daily.  Drug-induced constipation: Patient has noticed increased constipation since initiating narcotics.  She has been started on Linzess with minimal to no improvement.  She has also been using laxatives as needed.  She has noticed a change in appetite, intermittent nausea, and weight loss.  Patient will be contacting her gastroenterologist today for further evaluation and  management.  Other medical conditions are listed as follows:  Dyslipidemia  History of gastroesophageal reflux (GERD)  History of asthma  Premature ovarian failure  Orders: Orders Placed This Encounter  Procedures   Comprehensive metabolic panel with GFR   VITAMIN D  25 Hydroxy (Vit-D Deficiency, Fractures)   No orders of the defined types were placed in this encounter.    Follow-Up Instructions: Return in about 6 months (around 06/18/2024) for Osteoporosis.   Waddell CHRISTELLA Craze, PA-C  Note - This record has been created using Dragon software.  Chart creation errors have been sought, but may not always  have been located. Such creation errors do not reflect on  the standard of medical care.

## 2023-12-17 ENCOUNTER — Ambulatory Visit: Attending: Physician Assistant | Admitting: Physician Assistant

## 2023-12-17 ENCOUNTER — Encounter: Payer: Self-pay | Admitting: Physician Assistant

## 2023-12-17 VITALS — BP 128/78 | HR 71 | Resp 12 | Ht 60.0 in | Wt 93.0 lb

## 2023-12-17 DIAGNOSIS — Z5181 Encounter for therapeutic drug level monitoring: Secondary | ICD-10-CM

## 2023-12-17 DIAGNOSIS — E559 Vitamin D deficiency, unspecified: Secondary | ICD-10-CM

## 2023-12-17 DIAGNOSIS — Z8709 Personal history of other diseases of the respiratory system: Secondary | ICD-10-CM

## 2023-12-17 DIAGNOSIS — E2839 Other primary ovarian failure: Secondary | ICD-10-CM

## 2023-12-17 DIAGNOSIS — Z8781 Personal history of (healed) traumatic fracture: Secondary | ICD-10-CM

## 2023-12-17 DIAGNOSIS — M810 Age-related osteoporosis without current pathological fracture: Secondary | ICD-10-CM | POA: Diagnosis not present

## 2023-12-17 DIAGNOSIS — M19042 Primary osteoarthritis, left hand: Secondary | ICD-10-CM

## 2023-12-17 DIAGNOSIS — M1711 Unilateral primary osteoarthritis, right knee: Secondary | ICD-10-CM

## 2023-12-17 DIAGNOSIS — M19041 Primary osteoarthritis, right hand: Secondary | ICD-10-CM

## 2023-12-17 DIAGNOSIS — E785 Hyperlipidemia, unspecified: Secondary | ICD-10-CM

## 2023-12-17 DIAGNOSIS — Z8719 Personal history of other diseases of the digestive system: Secondary | ICD-10-CM

## 2023-12-17 DIAGNOSIS — K5903 Drug induced constipation: Secondary | ICD-10-CM

## 2023-12-17 DIAGNOSIS — M5030 Other cervical disc degeneration, unspecified cervical region: Secondary | ICD-10-CM

## 2023-12-17 DIAGNOSIS — E2740 Unspecified adrenocortical insufficiency: Secondary | ICD-10-CM

## 2023-12-17 LAB — COMPREHENSIVE METABOLIC PANEL WITH GFR
AG Ratio: 2.8 (calc) — ABNORMAL HIGH (ref 1.0–2.5)
ALT: 17 U/L (ref 6–29)
AST: 26 U/L (ref 10–35)
Albumin: 4.8 g/dL (ref 3.6–5.1)
Alkaline phosphatase (APISO): 63 U/L (ref 37–153)
BUN/Creatinine Ratio: 31 (calc) — ABNORMAL HIGH (ref 6–22)
BUN: 27 mg/dL — ABNORMAL HIGH (ref 7–25)
CO2: 27 mmol/L (ref 20–32)
Calcium: 10.2 mg/dL (ref 8.6–10.4)
Chloride: 102 mmol/L (ref 98–110)
Creat: 0.87 mg/dL (ref 0.60–0.95)
Globulin: 1.7 g/dL — ABNORMAL LOW (ref 1.9–3.7)
Glucose, Bld: 85 mg/dL (ref 65–99)
Potassium: 4.7 mmol/L (ref 3.5–5.3)
Sodium: 138 mmol/L (ref 135–146)
Total Bilirubin: 0.7 mg/dL (ref 0.2–1.2)
Total Protein: 6.5 g/dL (ref 6.1–8.1)
eGFR: 67 mL/min/1.73m2 (ref >=60)

## 2023-12-17 LAB — VITAMIN D 25 HYDROXY (VIT D DEFICIENCY, FRACTURES): Vit D, 25-Hydroxy: 43 ng/mL (ref 30–100)

## 2023-12-18 ENCOUNTER — Ambulatory Visit: Payer: Self-pay | Admitting: Physician Assistant

## 2023-12-18 NOTE — Progress Notes (Signed)
 Vitamin D   WNL CMP stable-calcium  WNL

## 2023-12-23 ENCOUNTER — Telehealth: Payer: Self-pay | Admitting: Rheumatology

## 2023-12-23 ENCOUNTER — Encounter: Payer: Self-pay | Admitting: Rheumatology

## 2023-12-23 DIAGNOSIS — M810 Age-related osteoporosis without current pathological fracture: Secondary | ICD-10-CM

## 2023-12-23 NOTE — Telephone Encounter (Signed)
 Pt called requesting if she could come in tomorrow morning to receive a prolia  shot. Pt is requesting a call back

## 2023-12-23 NOTE — Telephone Encounter (Signed)
 Patient scheduled for Prolia  on 12/24/2023. Denies any active infection or abx use.

## 2023-12-23 NOTE — Telephone Encounter (Signed)
 Patient is ready to proceed with next prolia  dose.

## 2023-12-23 NOTE — Telephone Encounter (Signed)
 Patient scheduled for Prolia  appt on 12/24/2023

## 2023-12-24 ENCOUNTER — Other Ambulatory Visit: Payer: Self-pay | Admitting: Gastroenterology

## 2023-12-24 ENCOUNTER — Ambulatory Visit: Attending: Rheumatology | Admitting: Pharmacist

## 2023-12-24 ENCOUNTER — Other Ambulatory Visit (HOSPITAL_COMMUNITY): Payer: Self-pay

## 2023-12-24 DIAGNOSIS — Z79899 Other long term (current) drug therapy: Secondary | ICD-10-CM

## 2023-12-24 DIAGNOSIS — R634 Abnormal weight loss: Secondary | ICD-10-CM

## 2023-12-24 DIAGNOSIS — M810 Age-related osteoporosis without current pathological fracture: Secondary | ICD-10-CM

## 2023-12-24 DIAGNOSIS — Z7689 Persons encountering health services in other specified circumstances: Secondary | ICD-10-CM

## 2023-12-24 MED ORDER — DENOSUMAB 60 MG/ML ~~LOC~~ SOSY: 60.0000 mg | PREFILLED_SYRINGE | SUBCUTANEOUS | Status: AC

## 2023-12-24 NOTE — Progress Notes (Signed)
 Pharmacy Note  Subjective:   Patient presents to clinic today to receive bi-annual dose of Prolia . Patient's last dose of Prolia  was on 04/29/2023. She continues on Forteo  once daily. Prolia  was delayed due to oral ulcers (concern for ONJ).  She has been seeing pain management and GI. Has been on Butrans transdermal patch for pain but has been experiencing difficulty breathing. Pain management discontinued patch this morning with plan to switch her on tramadol.  Patient running a fever or have signs/symptoms of infection? No  Patient currently on antibiotics for the treatment of infection? No  Patient had fall in the last 6 months?  No    Patient taking calcium  1200 mg daily through diet or supplement and at least 800 units vitamin D ? Yes  Objective: CMP     Component Value Date/Time   NA 138 12/17/2023 1003   NA 137 09/12/2023 1127   K 4.7 12/17/2023 1003   CL 102 12/17/2023 1003   CO2 27 12/17/2023 1003   GLUCOSE 85 12/17/2023 1003   BUN 27 (H) 12/17/2023 1003   BUN 18 09/12/2023 1127   CREATININE 0.87 12/17/2023 1003   CALCIUM  10.2 12/17/2023 1003   PROT 6.5 12/17/2023 1003   PROT 5.7 (L) 09/12/2023 1127   ALBUMIN 4.6 09/12/2023 1127   AST 26 12/17/2023 1003   ALT 17 12/17/2023 1003   ALKPHOS 59 09/12/2023 1127   BILITOT 0.7 12/17/2023 1003   BILITOT 0.5 09/12/2023 1127   GFRNONAA 75 07/14/2020 1031   GFRAA 87 07/14/2020 1031    CBC    Component Value Date/Time   WBC 6.2 09/12/2023 1127   WBC 8.0 07/17/2021 1348   RBC 4.00 09/12/2023 1127   RBC 4.18 07/17/2021 1348   HGB 12.3 09/12/2023 1127   HGB 12.4 08/06/2013 1128   HCT 37.6 09/12/2023 1127   PLT 255 09/12/2023 1127   MCV 94 09/12/2023 1127   MCH 30.8 09/12/2023 1127   MCH 31.1 07/17/2021 1348   MCHC 32.7 09/12/2023 1127   MCHC 32.7 07/17/2021 1348   RDW 13.2 09/12/2023 1127   LYMPHSABS 1.0 09/12/2023 1127   EOSABS 0.1 09/12/2023 1127   BASOSABS 0.0 09/12/2023 1127    Lab Results  Component Value  Date   VD25OH 43 12/17/2023   Previous DEXA 05/04/20: BMD as determined from [facility] L1-L4 (L2,L3) is 0.820 g/cm2 with a T-Score of -2.9-Osteoporosis.  Updated DEXA 08/17/2022 The BMD measured at Femur Neck Left is 0.704 g/cm2 with a T-score of-2.4  Assessment/Plan:   Reviewed importance of adequate dietary intake of calcium  in addition to supplementation due to risk of hypocalcemia with Prolia .   Patient tolerated injection well.   Administration details as below: Administrations This Visit     denosumab  (PROLIA ) injection 60 mg     Admin Date 12/24/2023 Action Given Dose 60 mg Route Subcutaneous Documented By Dayne Sherry RAMAN, RPH-CPP           Patient's next Prolia  dose is due on 06/21/2024.  Patient is due for updated DEXA in April 2026.   All questions encouraged and answered.  Instructed patient to call with any further questions or concerns.   Sherry Dayne, PharmD, MPH, BCPS, CPP Clinical Pharmacist (Rheumatology and Pulmonology)

## 2023-12-31 ENCOUNTER — Inpatient Hospital Stay
Admission: RE | Admit: 2023-12-31 | Discharge: 2023-12-31 | Discharge status: Home / Self Care | Source: Ambulatory Visit | Attending: Gastroenterology | Admitting: Gastroenterology

## 2023-12-31 ENCOUNTER — Encounter: Payer: Self-pay | Admitting: Radiology

## 2023-12-31 DIAGNOSIS — R634 Abnormal weight loss: Secondary | ICD-10-CM

## 2023-12-31 MED ORDER — IOPAMIDOL (ISOVUE-300) INJECTION 61%
100.0000 mL | Freq: Once | INTRAVENOUS | Status: AC | PRN
  Administered 2023-12-31: 100 mL via INTRAVENOUS

## 2024-01-01 ENCOUNTER — Other Ambulatory Visit (HOSPITAL_COMMUNITY): Payer: Self-pay

## 2024-01-03 ENCOUNTER — Other Ambulatory Visit: Payer: Self-pay

## 2024-01-03 NOTE — Progress Notes (Signed)
 Specialty Pharmacy Refill Coordination Note  Megan Logan is a 83 y.o. female contacted today regarding refills of specialty medication(s) Teriparatide    Patient requested Delivery   Delivery date: 01/09/24   Verified address: [address] [place] [address]   Medication will be filled on 09.03.25.

## 2024-01-29 ENCOUNTER — Other Ambulatory Visit: Payer: Self-pay

## 2024-01-29 NOTE — Progress Notes (Signed)
 Specialty Pharmacy Refill Coordination Note  Megan Logan is a 83 y.o. female contacted today regarding refills of specialty medication(s) Teriparatide    Patient requested Delivery   Delivery date: 02/06/24   Verified address: [address] [place] [address]   Medication will be filled on 10.01.25.

## 2024-01-31 ENCOUNTER — Ambulatory Visit (INDEPENDENT_AMBULATORY_CARE_PROVIDER_SITE_OTHER): Admitting: Otolaryngology

## 2024-01-31 VITALS — BP 117/71 | HR 81 | Temp 98.9°F | Ht 60.0 in | Wt 90.0 lb

## 2024-01-31 DIAGNOSIS — H9041 Sensorineural hearing loss, unilateral, right ear, with unrestricted hearing on the contralateral side: Secondary | ICD-10-CM

## 2024-01-31 DIAGNOSIS — H9311 Tinnitus, right ear: Secondary | ICD-10-CM

## 2024-01-31 DIAGNOSIS — R42 Dizziness and giddiness: Secondary | ICD-10-CM | POA: Diagnosis not present

## 2024-01-31 DIAGNOSIS — H6123 Impacted cerumen, bilateral: Secondary | ICD-10-CM | POA: Diagnosis not present

## 2024-02-02 DIAGNOSIS — H6123 Impacted cerumen, bilateral: Secondary | ICD-10-CM | POA: Insufficient documentation

## 2024-02-02 NOTE — Progress Notes (Signed)
 Patient ID: Megan Logan, female   DOB: 1940-08-25, 83 y.o.   MRN: [id number]  Follow-up: Recurrent dizziness, right ear hearing loss, right ear Mnire's disease  HPI: The patient is an 83 year old female who returns today for follow-up evaluation.  The patient was previously seen for recurrent dizziness, asymmetric right ear hearing loss, right ear tinnitus, and right aural pressure.  She was diagnosed with right ear Mnire's disease.  She was initially treated with low-salt diet and Dyazide diuretic.  The patient could not tolerate the use of diuretic due to her dehydration.  She continued to have recurrent dizziness.  She was treated with right endolymphatic sac decompression surgery.  The patient returns today reporting significant improvement in her dizziness.  Currently she denies any spinning vertigo.  She has not noted any change in her right ear hearing or tinnitus.  Exam: General: Communicates without difficulty, well nourished, no acute distress. Head: Normocephalic, no evidence injury, no tenderness, facial buttresses intact without stepoff. Face/sinus: No tenderness to palpation and percussion. Facial movement is normal and symmetric. Eyes: PERRL, EOMI. No scleral icterus, conjunctivae clear. Neuro: CN II exam reveals vision grossly intact.  No nystagmus at any point of gaze. Ears: Auricles well formed without lesions.  Bilateral cerumen impaction.  The right postauricular incision is well-healed.  Nose: External evaluation reveals normal support and skin without lesions.  Dorsum is intact.  Anterior rhinoscopy reveals congested mucosa over anterior aspect of inferior turbinates and intact septum.  No purulence noted. Oral:  Oral cavity and oropharynx are intact, symmetric, without erythema or edema.  Mucosa is moist without lesions. Neck: Full range of motion without pain.  There is no significant lymphadenopathy.  No masses palpable.  Thyroid bed within normal limits to palpation.  Parotid  glands and submandibular glands equal bilaterally without mass.  Trachea is midline. Neuro:  CN 2-12 grossly intact. Gait wide-based. Vestibular: No nystagmus at any point of gaze. Dix Hallpike negative. Vestibular: There is no nystagmus with pneumatic pressure on either tympanic membrane or Valsalva. The cerebellar examination is unremarkable.   Procedure: Bilateral cerumen disimpaction Anesthesia: None Description: Under the operating microscope, the cerumen is carefully removed with a combination of cerumen currette, alligator forceps, and suction catheters.  After the cerumen is removed, the TMs are noted to be normal.  No mass, erythema, or lesions. The patient tolerated the procedure well.     Assessment 1.  The patient's recurrent dizziness is currently under control after her right endolymphatic sac decompression surgery. 2.  Subjectively stable right ear hearing loss and tinnitus. 3.  Incidental finding of bilateral cerumen impaction.  After the disimpaction procedure, both tympanic membranes and middle ear spaces are noted to be normal.  Plan: 1.  Otomicroscopy with bilateral cerumen disimpaction. 2.  The physical exam findings are reviewed with the patient. 3.  Continue with her 1500 mg low-salt diet. 4.  The patient will return for reevaluation in 6 months, sooner if needed.

## 2024-02-04 ENCOUNTER — Ambulatory Visit (INDEPENDENT_AMBULATORY_CARE_PROVIDER_SITE_OTHER): Admitting: Audiology

## 2024-02-05 ENCOUNTER — Other Ambulatory Visit: Payer: Self-pay

## 2024-02-27 ENCOUNTER — Other Ambulatory Visit: Payer: Self-pay | Admitting: Physician Assistant

## 2024-02-27 ENCOUNTER — Other Ambulatory Visit (HOSPITAL_COMMUNITY): Payer: Self-pay

## 2024-02-27 ENCOUNTER — Other Ambulatory Visit: Payer: Self-pay

## 2024-02-27 MED ORDER — TERIPARATIDE 560 MCG/2.24ML ~~LOC~~ SOPN
20.0000 ug | PEN_INJECTOR | Freq: Every day | SUBCUTANEOUS | 0 refills | Status: DC
  Filled 2024-02-27: qty 7.44, 93d supply, fill #0
  Filled 2024-03-02: qty 2.24, 28d supply, fill #0
  Filled 2024-03-27: qty 2.24, 28d supply, fill #1
  Filled 2024-04-17: qty 2.24, 28d supply, fill #2

## 2024-02-27 NOTE — Telephone Encounter (Signed)
 Last Fill: 12/09/2023  Labs: 12/17/2023 Vitamin D   WNL CMP stable-calcium  WNL  Next Visit: 06/25/2024  Last Visit: 12/17/2023  DX: Age-related osteoporosis without current pathological fracture   Current Dose per office note 12/17/2023: Forteo  daily injections added as combination therapy on 11/21/22   Okay to refill Forteo ?

## 2024-03-02 ENCOUNTER — Other Ambulatory Visit: Payer: Self-pay

## 2024-03-03 ENCOUNTER — Other Ambulatory Visit: Payer: Self-pay

## 2024-03-03 NOTE — Progress Notes (Signed)
 Clinical Intervention Note  Clinical Intervention Notes: Patient recently started Ibsrela. No DDIs identified with Forteo  or Prolia    Clinical Intervention Outcomes: Prevention of an adverse drug event   [employer]vertising Account Planner

## 2024-03-03 NOTE — Progress Notes (Signed)
 Specialty Pharmacy Refill Coordination Note  Megan Logan is a 83 y.o. female contacted today regarding refills of specialty medication(s) Teriparatide    Patient requested Delivery   Delivery date: 03/05/24   Verified address: [address] [place], [place] [address]   Medication will be filled on: 03/04/24

## 2024-03-03 NOTE — Progress Notes (Signed)
 Specialty Pharmacy Ongoing Clinical Assessment Note  Megan Logan is a 83 y.o. female who is being followed by the specialty pharmacy service for RxSp Osteoporosis   Patient's specialty medication(s) reviewed today: Teriparatide    Missed doses in the last 4 weeks: 3   Patient/Caregiver did not have any additional questions or concerns.   Therapeutic benefit summary: Patient is achieving benefit   Adverse events/side effects summary: No adverse events/side effects   Patient's therapy is appropriate to: Continue    Goals Addressed             This Visit's Progress    Stabilization of disease   On track    Patient is on track. Patient will maintain adherence         Follow up: 12 months  [facility]

## 2024-03-04 ENCOUNTER — Other Ambulatory Visit: Payer: Self-pay | Admitting: Rheumatology

## 2024-03-04 ENCOUNTER — Other Ambulatory Visit: Payer: Self-pay

## 2024-03-04 MED ORDER — DROPLET PEN NEEDLES 31G X 5 MM MISC
0 refills | Status: AC
  Filled 2024-03-04: qty 100, 100d supply, fill #0

## 2024-03-04 NOTE — Telephone Encounter (Signed)
 Last Fill: 11/26/2023  Next Visit: 06/25/2024  Last Visit: 12/17/2023  DX: Age-related osteoporosis without current pathological fracture   Current Dose per office note on 12/17/2023: Forteo  daily injections   Okay to refill forteo  needles?

## 2024-03-05 ENCOUNTER — Other Ambulatory Visit: Payer: Self-pay

## 2024-03-27 ENCOUNTER — Other Ambulatory Visit: Payer: Self-pay

## 2024-03-27 NOTE — Progress Notes (Signed)
 Specialty Pharmacy Refill Coordination Note  Megan Logan is a 83 y.o. female contacted today regarding refills of specialty medication(s) Teriparatide    Patient requested Delivery   Delivery date: 03/31/24   Verified address: [address] [place], [name] [address]   Medication will be filled on: 03/30/24

## 2024-04-17 ENCOUNTER — Other Ambulatory Visit (HOSPITAL_COMMUNITY): Payer: Self-pay

## 2024-04-21 ENCOUNTER — Other Ambulatory Visit: Payer: Self-pay

## 2024-04-23 ENCOUNTER — Other Ambulatory Visit: Payer: Self-pay

## 2024-04-23 NOTE — Progress Notes (Signed)
 Specialty Pharmacy Refill Coordination Note  Megan Logan is a 83 y.o. female contacted today regarding refills of specialty medication(s) Teriparatide    Patient requested Delivery   Delivery date: 04/28/24   Verified address: [address] [place], [place] [address]   Medication will be filled on: 04/27/24

## 2024-05-18 ENCOUNTER — Other Ambulatory Visit: Payer: Self-pay | Admitting: Physician Assistant

## 2024-05-18 ENCOUNTER — Other Ambulatory Visit (HOSPITAL_COMMUNITY): Payer: Self-pay

## 2024-05-18 ENCOUNTER — Other Ambulatory Visit: Payer: Self-pay

## 2024-05-18 MED ORDER — TERIPARATIDE 560 MCG/2.24ML ~~LOC~~ SOPN
20.0000 ug | PEN_INJECTOR | Freq: Every day | SUBCUTANEOUS | 0 refills | Status: AC
  Filled 2024-05-18: qty 7.44, 93d supply, fill #0
  Filled 2024-05-19: qty 2.24, 28d supply, fill #0

## 2024-05-18 NOTE — Telephone Encounter (Signed)
 She remains on forteo  daily injections.

## 2024-05-18 NOTE — Telephone Encounter (Signed)
 Last Fill: 02/27/2024  Labs: 12/17/2023  Vitamin D   WNL CMP stable-calcium  WNL  05/11/2024 CBC normal  08/17/2022 DEXA   Next Visit: 06/25/2024  Last Visit: 12/17/2023  DX: Age-related osteoporosis without current pathological fracture   Current Dose per office note 12/17/2023: not mentioned  Okay to refill Forteo ?

## 2024-05-19 ENCOUNTER — Other Ambulatory Visit: Payer: Self-pay

## 2024-05-21 ENCOUNTER — Other Ambulatory Visit: Payer: Self-pay

## 2024-05-25 ENCOUNTER — Other Ambulatory Visit: Payer: Self-pay

## 2024-05-25 ENCOUNTER — Other Ambulatory Visit: Payer: Self-pay | Admitting: Pharmacy Technician

## 2024-05-25 NOTE — Progress Notes (Signed)
 Specialty Pharmacy Refill Coordination Note  Megan Logan is a 84 y.o. female contacted today regarding refills of specialty medication(s) Teriparatide    Patient requested Delivery   Delivery date: 05/26/24   Verified address: [address] [place], [place] [address]   Medication will be filled on: 05/25/24    05/25/24 Patient is aware of $100 copay and authorizes charge to credit card on file.

## 2024-05-25 NOTE — Progress Notes (Signed)
 [employer] is not picking up 05/25/24, dues to [employer]. Sent MyChart message to patient with expected delivery of 05/27/24.

## 2024-05-25 NOTE — Progress Notes (Signed)
 Clinical Intervention Note  Clinical Intervention Notes: Patient reported taking magnesium three times daily. No DDIs identified with Forteo  or Prolia .   Clinical Intervention Outcomes: Prevention of an adverse drug event   Advertising Account Planner

## 2024-06-11 NOTE — Progress Notes (Unsigned)
 "  Office Visit Note  Patient: Megan Logan             Date of Birth: 06-19-1940           MRN: [id number]             PCP: Macarthur Elouise SQUIBB, DO Referring: Lazoff, Shawn P, DO Visit Date: 06/25/2024 Occupation: Data Unavailable  Subjective:  No chief complaint on file.   History of Present Illness: Megan Logan is a 84 y.o. female ***     Activities of Daily Living:  Patient reports morning stiffness for *** {minute/hour:19697}.   Patient {ACTIONS;DENIES/REPORTS:21021675::Denies} nocturnal pain.  Difficulty dressing/grooming: {ACTIONS;DENIES/REPORTS:21021675::Denies} Difficulty climbing stairs: {ACTIONS;DENIES/REPORTS:21021675::Denies} Difficulty getting out of chair: {ACTIONS;DENIES/REPORTS:21021675::Denies} Difficulty using hands for taps, buttons, cutlery, and/or writing: {ACTIONS;DENIES/REPORTS:21021675::Denies}  No Rheumatology ROS completed.   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 04/03/2023   Right-sided tinnitus 04/03/2023   Meniere's disease of right ear 04/02/2023   Essential hypertension 02/29/2020   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   Volume depletion, gastrointestinal loss 12/04/2019   Hyponatremia 12/03/2019   Dizziness 12/03/2019   Ketonuria 12/03/2019   Metabolic acidosis 12/03/2019   Chronic rhinitis 02/19/2019   Primary osteoarthritis involving multiple joints 02/19/2019   DDD (degenerative disc disease), cervical 12/15/2018   Dyslipidemia 12/15/2018   History of asthma 12/15/2018   Elevated hemoglobin A1c 11/14/2017   Chronic neck pain 09/19/2016   Asthma 07/07/2015   Eczema 07/07/2015   Gastroesophageal reflux disease without esophagitis 07/07/2015   Restless legs  syndrome 07/07/2015   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   Vitamin D  deficiency 08/06/2013   Age-related osteoporosis without current pathological fracture 08/04/2012   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   Osteoporosis 08/04/2012    Past Medical History:  Diagnosis Date   Adrenal insufficiency    Anemia    diet related   Asthma    Atrophic vaginitis    Colitis    Fracture of L1 vertebra (HCC)    Hx of endometriosis    took Danacrine   Hypercholesterolemia    Hypertension    Osteoporosis    spine off Actonel  sinsce 09/2010- [facility]   PONV (postoperative nausea and vomiting)    Premature ovarian failure age 31   HRT age 81 - 12/2000    Family History  Problem Relation Age of Onset   Prostate cancer Father 106   Alzheimer's disease Mother 17   Other Sister        dizziness   Cancer Sister 35       Brain and Lung   Ulcerative colitis Sister    Migraines Sister    Cancer - Colon Maternal Grandfather 51   Irritable bowel syndrome Daughter    Colitis Daughter    Past Surgical History:  Procedure Laterality Date   CATARACT EXTRACTION W/PHACO Left 05/15/2021   Procedure: CATARACT EXTRACTION PHACO AND INTRAOCULAR LENS PLACEMENT (IOC);  Surgeon: Harrie Agent, MD;  Location: [facility];  Service: Ophthalmology;  Laterality: Left;  CDE: 18.54   CATARACT EXTRACTION W/PHACO Right 05/29/2021   Procedure: CATARACT EXTRACTION PHACO AND INTRAOCULAR LENS PLACEMENT (IOC);  Surgeon: Harrie Agent, MD;  Location: [facility];  Service: Ophthalmology;  Laterality: Right;  CDE: 14.51   ENDOLYMPHATIC SAC DECOMPRESSION Right  09/16/2023   Procedure: DECOMPRESSION, ENDOLYMPHATIC SAC;  Surgeon: Karis Clunes, MD;  Location: [facility];  Service: ENT;  Laterality: Right;   GUM SURGERY  10/18/2023   HYSTEROSCOPY  05/07/1994   DUB--wnl   NASAL SINUS SURGERY  40's and 50's   TUBAL LIGATION Bilateral 05/07/1980   Social History[1] Social History   Social History Narrative    Not on file     Immunization History  Administered Date(s) Administered   Fluad Quad(high Dose 65+) 01/15/2019   INFLUENZA, HIGH DOSE SEASONAL PF 02/26/2014, 02/24/2017, 02/24/2018, 01/15/2019, 02/11/2020   Influenza Split 02/24/2017   Influenza,inj,Quad PF,6+ Mos 02/26/2014   Influenza,inj,quad, With Preservative 02/04/2017   Moderna Covid-19 Fall Seasonal Vaccine 10yrs & older 01/28/2022, 11/11/2023   Moderna Sars-Covid-2 Vaccination 05/19/2019, 06/17/2019, 02/29/2020, 08/10/2020   Pfizer Covid-19 Vaccine Bivalent Booster 58yrs & up 09/12/2021   Pfizer(Comirnaty)Fall Seasonal Vaccine 12 years and older 02/12/2024   Pneumococcal Conjugate-13 05/29/2015   Pneumococcal Polysaccharide-23 03/04/2013   Tdap 09/29/2015   Zoster Recombinant(Shingrix) 08/26/2017, 11/10/2017   Zoster, Live 05/07/2007, 08/26/2017, 11/10/2017     Objective: Vital Signs: LMP 05/07/1982    Physical Exam   Musculoskeletal Exam: ***  CDAI Exam: CDAI Score: -- Patient Global: --; Provider Global: -- Swollen: --; Tender: -- Joint Exam 06/25/2024   No joint exam has been documented for this visit   There is currently no information documented on the homunculus. Go to the Rheumatology activity and complete the homunculus joint exam.  Investigation: No additional findings.  Imaging: No results found.  Recent Labs: Lab Results  Component Value Date   WBC 6.2 09/12/2023   HGB 12.3 09/12/2023   PLT 255 09/12/2023   NA 138 12/17/2023   K 4.7 12/17/2023   CL 102 12/17/2023   CO2 27 12/17/2023   GLUCOSE 85 12/17/2023   BUN 27 (H) 12/17/2023   CREATININE 0.87 12/17/2023   BILITOT 0.7 12/17/2023   ALKPHOS 59 09/12/2023   AST 26 12/17/2023   ALT 17 12/17/2023   PROT 6.5 12/17/2023   ALBUMIN 4.6 09/12/2023   CALCIUM  10.2 12/17/2023   GFRAA 87 07/14/2020    Speciality Comments: Prolia : 12/24/18, 06/26/19, 12/31/19, 07/14/20, 01/14/21  Procedures:  No procedures performed Allergies: Cephalexin,  Erythromycin base, Hydrocodone bit-homatrop mbr, Levofloxacin, Penicillins, Sulfamethoxazole-trimethoprim, and Amoxicillin-pot clavulanate   Assessment / Plan:     Visit Diagnoses: No diagnosis found.  Orders: No orders of the defined types were placed in this encounter.  No orders of the defined types were placed in this encounter.   Face-to-face time spent with patient was *** minutes. Greater than 50% of time was spent in counseling and coordination of care.  Follow-Up Instructions: No follow-ups on file.   Daved JAYSON Gavel, CMA  Note - This record has been created using Animal nutritionist.  Chart creation errors have been sought, but may not always  have been located. Such creation errors do not reflect on  the standard of medical care.    [1]  Social History Tobacco Use   Smoking status: Never    Passive exposure: Past   Smokeless tobacco: Never  Vaping Use   Vaping status: Never Used  Substance Use Topics   Alcohol use: No   Drug use: No   "

## 2024-06-25 ENCOUNTER — Ambulatory Visit: Admitting: Rheumatology

## 2024-06-25 DIAGNOSIS — Z8719 Personal history of other diseases of the digestive system: Secondary | ICD-10-CM

## 2024-06-25 DIAGNOSIS — E2839 Other primary ovarian failure: Secondary | ICD-10-CM

## 2024-06-25 DIAGNOSIS — Z8709 Personal history of other diseases of the respiratory system: Secondary | ICD-10-CM

## 2024-06-25 DIAGNOSIS — M19041 Primary osteoarthritis, right hand: Secondary | ICD-10-CM

## 2024-06-25 DIAGNOSIS — E2740 Unspecified adrenocortical insufficiency: Secondary | ICD-10-CM

## 2024-06-25 DIAGNOSIS — E559 Vitamin D deficiency, unspecified: Secondary | ICD-10-CM

## 2024-06-25 DIAGNOSIS — Z8781 Personal history of (healed) traumatic fracture: Secondary | ICD-10-CM

## 2024-06-25 DIAGNOSIS — M5030 Other cervical disc degeneration, unspecified cervical region: Secondary | ICD-10-CM

## 2024-06-25 DIAGNOSIS — E785 Hyperlipidemia, unspecified: Secondary | ICD-10-CM

## 2024-06-25 DIAGNOSIS — M1711 Unilateral primary osteoarthritis, right knee: Secondary | ICD-10-CM

## 2024-06-25 DIAGNOSIS — M810 Age-related osteoporosis without current pathological fracture: Secondary | ICD-10-CM

## 2024-06-25 DIAGNOSIS — Z5181 Encounter for therapeutic drug level monitoring: Secondary | ICD-10-CM

## 2024-06-25 DIAGNOSIS — K5903 Drug induced constipation: Secondary | ICD-10-CM

## 2024-08-03 ENCOUNTER — Ambulatory Visit (INDEPENDENT_AMBULATORY_CARE_PROVIDER_SITE_OTHER): Admitting: Otolaryngology
